# Patient Record
Sex: Female | Born: 1950
Health system: Southern US, Community
[De-identification: ages and names within clinical notes are randomized; demographics above are authoritative.]

## PROBLEM LIST (undated history)

## (undated) DIAGNOSIS — M1990 Unspecified osteoarthritis, unspecified site: Secondary | ICD-10-CM

## (undated) DIAGNOSIS — E785 Hyperlipidemia, unspecified: Secondary | ICD-10-CM

## (undated) DIAGNOSIS — S83512A Sprain of anterior cruciate ligament of left knee, initial encounter: Secondary | ICD-10-CM

## (undated) DIAGNOSIS — E079 Disorder of thyroid, unspecified: Secondary | ICD-10-CM

## (undated) DIAGNOSIS — M8580 Other specified disorders of bone density and structure, unspecified site: Secondary | ICD-10-CM

## (undated) DIAGNOSIS — R42 Dizziness and giddiness: Secondary | ICD-10-CM

## (undated) DIAGNOSIS — F32A Depression, unspecified: Secondary | ICD-10-CM

## (undated) DIAGNOSIS — E119 Type 2 diabetes mellitus without complications: Secondary | ICD-10-CM

## (undated) DIAGNOSIS — L309 Dermatitis, unspecified: Secondary | ICD-10-CM

## (undated) DIAGNOSIS — F329 Major depressive disorder, single episode, unspecified: Secondary | ICD-10-CM

## (undated) DIAGNOSIS — C4491 Basal cell carcinoma of skin, unspecified: Secondary | ICD-10-CM

## (undated) DIAGNOSIS — K559 Vascular disorder of intestine, unspecified: Secondary | ICD-10-CM

## (undated) HISTORY — PX: BREAST BIOPSY: SHX20

## (undated) HISTORY — DX: Disorder of thyroid, unspecified: E07.9

## (undated) HISTORY — DX: Hyperlipidemia, unspecified: E78.5

## (undated) HISTORY — DX: Depression, unspecified: F32.A

## (undated) HISTORY — DX: Sprain of anterior cruciate ligament of left knee, initial encounter: S83.512A

## (undated) HISTORY — DX: Major depressive disorder, single episode, unspecified: F32.9

## (undated) HISTORY — DX: Type 2 diabetes mellitus without complications: E11.9

## (undated) HISTORY — DX: Vascular disorder of intestine, unspecified: K55.9

## (undated) HISTORY — PX: TONSILLECTOMY: SUR1361

## (undated) HISTORY — DX: Dermatitis, unspecified: L30.9

## (undated) HISTORY — DX: Other specified disorders of bone density and structure, unspecified site: M85.80

## (undated) HISTORY — PX: TUBAL LIGATION: SHX77

## (undated) HISTORY — DX: Basal cell carcinoma of skin, unspecified: C44.91

---

## 1976-10-31 HISTORY — PX: THYROIDECTOMY: SHX17

## 1995-11-01 HISTORY — PX: BUNIONECTOMY: SHX129

## 1996-10-31 HISTORY — PX: BREAST MASS EXCISION: SHX1267

## 2006-08-10 HISTORY — PX: SHOULDER SURGERY: SHX246

## 2007-12-04 HISTORY — PX: KNEE ARTHROSCOPY: SUR90

## 2013-07-01 LAB — HM COLONOSCOPY

## 2013-09-05 NOTE — Patient Instructions (Signed)
Arthritis: After Your Visit  Your Care Instructions  Arthritis, also called osteoarthritis, is a breakdown of the cartilage that cushions your joints. When the cartilage wears down, your bones rub against each other. This causes pain and stiffness. Many people have some arthritis as they age. Arthritis most often affects the joints of the spine, hands, hips, knees, or feet.  You can take simple measures to protect your joints, ease your pain, and help you stay active.  Follow-up care is a key part of your treatment and safety. Be sure to make and go to all appointments, and call your doctor if you are having problems. It's also a good idea to know your test results and keep a list of the medicines you take.  How can you care for yourself at home?  ?? Stay at a healthy weight. Being overweight puts extra strain on your joints.  ?? Talk to your doctor or physical therapist about exercises that will help ease joint pain.  ?? Stretch. You may enjoy gentle forms of yoga to help keep your joints and muscles flexible.  ?? Walk instead of jog. Other types of exercise that are less stressful on the joints include riding a bicycle, swimming, or water exercise.  ?? Lift weights. Strong muscles help reduce stress on your joints. Stronger thigh muscles, for example, take some of the stress off of the knees and hips. Learn the right way to lift weights so you do not make joint pain worse.  ?? Take your medicines exactly as prescribed. Call your doctor if you think you are having a problem with your medicine.  ?? Take pain medicines exactly as directed.  ?? If the doctor gave you a prescription medicine for pain, take it as prescribed.  ?? If you are not taking a prescription pain medicine, ask your doctor if you can take an over-the-counter medicine.  ?? Use a cane, crutch, walker, or another device if you need help to get around. These can help rest your joints. You also can use other things to make life easier, such as a higher toilet  seat and padded handles on kitchen utensils.  ?? Do not sit in low chairs, which can make it hard to get up.  ?? Put heat or cold on your sore joints as needed. Use whichever helps you most. You also can take turns with hot and cold packs.  ?? Apply heat 2 or 3 times a day for 20 to 30 minutes???using a heating pad, hot shower, or hot pack???to relieve pain and stiffness.  ?? Put ice or a cold pack on your sore joint for 10 to 20 minutes at a time. Put a thin cloth between the ice and your skin.  ?? Your doctor may suggest you try the supplements glucosamine and chondroitin. They are part of what makes up normal cartilage. Tell your doctor if you have diabetes, allergies to shellfish, or if you take warfarin (Coumadin).  When should you call for help?  Call your doctor now or seek immediate medical care if:  ?? You have sudden swelling, warmth, or pain in any joint.  ?? You have joint pain and a fever or rash.  ?? You have such bad pain that you cannot use a joint.  Watch closely for changes in your health, and be sure to contact your doctor if:  ?? You have mild joint symptoms that continue even with more than 6 weeks of care at home.  ?? You have stomach   pain or other problems with your medicine.   Where can you learn more?   Go to http://www.healthwise.net/[facility]  Enter Z807 in the search box to learn more about "Arthritis: After Your Visit."   ?? 2006-2014 Healthwise, Incorporated. Care instructions adapted under license by [place] (which disclaims liability or warranty for this information). This care instruction is for use with your licensed healthcare professional. If you have questions about a medical condition or this instruction, always ask your healthcare professional. Healthwise, Incorporated disclaims any warranty or liability for your use of this information.  Content Version: 10.2.346038; Current as of: Mar 16, 2012              Osteoarthritis: After Your Visit  Your Care Instructions     Arthritis is a  common health problem in which the joints are inflamed. There are several kinds of arthritis. Osteoarthritis is caused by a breakdown of cartilage, the hard, thick tissue that cushions the joints. It causes pain, stiffness, and swelling, often in the spine, fingers, hips, and knees. Osteoarthritis can happen at any age, but it is most common in older people.  Osteoarthritis never goes away completely, but it can be controlled. Medicine and home treatment can reduce the pain and prevent the arthritis from getting worse.  Follow-up care is a key part of your treatment and safety. Be sure to make and go to all appointments, and call your doctor if you are having problems. It???s also a good idea to know your test results and keep a list of the medicines you take.  How can you care for yourself at home?  ?? Take a warm shower or bath in the morning to relieve stiffness. Avoid sitting still afterwards.  ?? If the joint is not swollen, use moist heat, like a warm, damp towel, for 20 to 30 minutes, 2 or 3 times a day. Do not use heat on a swollen joint.  ?? If the joint is swollen, use ice or cold packs for 10 to 20 minutes, once an hour. Cold will help relieve pain and reduce inflammation. Put a thin cloth between the ice and your skin.  ?? To prevent stiffness, gently move the joint through its full range of motion several times a day.  ?? If the joint hurts, avoid activities that put a strain on it for a few days. Take rest breaks throughout the day.  ?? Get regular exercise. Walking, swimming, yoga, biking, and water aerobics are good exercises that are gentle on the joints.  ?? Reach and stay at a healthy weight. If you need to lose or maintain weight, regular exercise and a healthy diet will help. Extra weight can strain the joints, especially the knees and hips, and make the pain worse. Losing even a few pounds may help.  ?? Take pain medicines exactly as directed.  ?? If the doctor gave you a prescription medicine for pain,  take it as prescribed.  ?? If you are not taking a prescription pain medicine, ask your doctor if you can take an over-the-counter medicine.  When should you call for help?  Call your doctor now or seek immediate medical care if:  ?? The pain is so bad that you cannot use the joint.  ?? You have sudden back pain with weakness in your legs or loss of bowel or bladder control.  ?? Your stools are black and tarlike or have streaks of blood.  ?? You have severe pain and swelling in more   than one joint.  Watch closely for changes in your health, and be sure to contact your doctor if:  ?? You have side effects from the medicines, like belly pain, ongoing heartburn, or nausea.  ?? Joint pain continues for more than 6 weeks, and home treatment is not helping.   Where can you learn more?   Go to http://www.healthwise.net/[facility]  Enter U459 in the search box to learn more about "Osteoarthritis: After Your Visit."   ?? 2006-2014 Healthwise, Incorporated. Care instructions adapted under license by [place] (which disclaims liability or warranty for this information). This care instruction is for use with your licensed healthcare professional. If you have questions about a medical condition or this instruction, always ask your healthcare professional. Healthwise, Incorporated disclaims any warranty or liability for your use of this information.  Content Version: 10.2.346038; Current as of: Mar 16, 2012

## 2013-09-05 NOTE — Progress Notes (Signed)
Sarah Blair     [birth year]          Rheumatology Consult Note    RE: Arthritis    Subjective:     62 y/o female p/w first episode of persistent (since the beginning of the summer) painful and swollen hand IP's. Pain worse with rest and better with activity. No association with time of day. No psoriasis. Denies prodrome, infection or injury. Takes aleve 1 tab bid with benefit.     Past Medical History   Diagnosis Date   ??? Diabetes mellitus    ??? Osteoarthritis    ??? Hypothyroidism      Past Surgical History   Procedure Laterality Date   ??? Hx rotator cuff repair Bilateral      2006 ,2007    ??? Hx thyroidectomy     ??? Hx bunionectomy       Outpatient Prescriptions Marked as Taking for the 09/05/13 encounter (Office Visit) with Charlyne Mom, MD   Medication Sig Dispense Refill   ??? metformin (GLUCOPHAGE) 500 mg tablet Take  by mouth two (2) times daily (with meals).       ??? rosuvastatin (CRESTOR) 5 mg tablet Take  by mouth nightly.       ??? buPROPion SR (WELLBUTRIN SR) 100 mg SR tablet Take  by mouth.       ??? glimepiride (AMARYL) 1 mg tablet Take  by mouth Daily (before breakfast).       ??? levothyroxine (SYNTHROID) 200 mcg tablet Take  by mouth Daily (before breakfast).         Allergies no known allergies  History     Social History   ??? Marital Status: MARRIED     Spouse Name: N/A     Number of Children: N/A   ??? 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 Smoking status: Never Smoker    ??? Smokeless tobacco: Not on file   ??? Alcohol Use: Yes      Comment: rare   ??? Drug Use: No   ??? Sexually Active: Not on file     Other Topics Concern   ??? Not on file     Social History Narrative   ??? No narrative on file     Patient is a non-smoker. Patient counseled not to smoke.  Family History   Problem Relation Age of Onset   ??? Asthma Other    ??? Other Other      niece with RA     No health maintenance topics applied.    Review of Systems  Constitutional: negative for fevers,  chills, sweats, fatigue, anorexia and weight loss/gain  Ears, Nose, Mouth, Throat, and Face: negative for hearing loss, tinnitus, ear drainage, earaches, nasal congestion, epistaxis, snoring, sore mouth, sore throat, hoarseness, voice change and facial trauma  Respiratory: negative for SOB, cough, sputum, pleurisy/chest pain, wheezing or dyspnea on exertion  Cardiovascular: negative for chest pain, chest pressure/discomfort, dyspnea, palpitations, irregular heart beats, syncope, fatigue, orthopnea, exertional chest pressure/discomfort, claudication, lower extremity edema  Gastrointestinal: negative for dysphagia, odynophagia, dyspepsia, nausea, vomiting, diarrhea, constipation and abdominal pain  Hematologic/Lymphatic: negative for easy bruising, bleeding, lymphadenopathy, petechiae, blood in stool or urine, coughing up blood and epistaxis  Neurological: negative for headaches, dizziness, vertigo, seizures, memory problems, speech problems, paresthesia, coordination problems, gait problems, tremor and weakness  Rheumatological: negative for joint pain  or swelling, alopecia, sicca, rashes, oral ulcers, poor dentition, parotid swelling, LAN, raynauds, nail complaints, swollen digits, muscle weakness or pain, SI tenderness, pleuritis, pericarditis, photosensitivity, uveitis, DVT's, miscarriages    Objective:     PHYSICAL EXAM   BP 144/72   Pulse 71   Ht 5\' 4"  (1.626 m)   Wt 140 lb (63.504 kg)   BMI 24.02 kg/m2   SpO2 98%     General: Adult Female NAD. Cooperative and coherent.   Neuro: AAO x 3, No tremors. Full reflexes. Full UE and LE strength.   HEENT: PEARL B/L, no scleral erythema, no alopecia, no oral ulcers, moist mucous membranes. Good dentition. No parotid swelling.   Heart: RRR, no murmurs. 2+ DP and radial pulses.    Lungs: CTAB   Abdomen: NT, ND   Skin: No rashes, raynauds, periungual erythema, nail findings, alopecia   Extremities Full ROM all joints. No synovitis. +B/L shoulder and knee crepitus. B/L wrist  squaring and H/B nodes. No SI tenderness. Left bunion and mild hammertoes.     Imaging  NA    Lab Review  Reportedly neg RF/Lyme/ANA/CRP    Assessment:     1. Osteoarthritis, hand, left    2. Osteoarthritis, hand, right        Plan:   Probable Hand OA  - check hand films, ccp, crp   - aleve 1 tab bid with food  Follow-up Disposition:  Return in about 2 weeks (around 09/19/2013).      Signed By: Charlyne Mom, MD     September 05, 2013   1:05 PM   [phone]

## 2013-09-20 ENCOUNTER — Encounter

## 2013-09-23 NOTE — Progress Notes (Signed)
Sarah Blair     [birth year]          Rheumatology Consult Note    RE: Arthritis    Subjective:     62 y/o female p/w first episode of persistent (since the beginning of the summer) painful and swollen hand IP's. Pain worse with rest and better with activity. No association with time of day. No psoriasis. Denies prodrome, infection or injury. Takes aleve 1 tab qd.   At last visit checked BW and xrays.   Today reviewed results with the patient. New GI upset.     Past Medical History   Diagnosis Date   ??? Diabetes mellitus    ??? Osteoarthritis    ??? Hypothyroidism      Past Surgical History   Procedure Laterality Date   ??? Hx rotator cuff repair Bilateral      2006 ,2007    ??? Hx thyroidectomy     ??? Hx bunionectomy       Outpatient Prescriptions Marked as Taking for the 09/23/13 encounter (Office Visit) with Charlyne Mom, MD   Medication Sig Dispense Refill   ??? metformin (GLUCOPHAGE) 500 mg tablet Take  by mouth two (2) times daily (with meals).       ??? rosuvastatin (CRESTOR) 5 mg tablet Take  by mouth nightly.       ??? buPROPion SR (WELLBUTRIN SR) 100 mg SR tablet Take  by mouth.       ??? glimepiride (AMARYL) 1 mg tablet Take  by mouth Daily (before breakfast).       ??? levothyroxine (SYNTHROID) 200 mcg tablet Take  by mouth Daily (before breakfast).         No Known Allergies  History     Social History   ??? Marital Status: MARRIED     Spouse Name: N/A     Number of Children: N/A   ??? 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 Smoking status: Never Smoker    ??? Smokeless tobacco: Not on file   ??? Alcohol Use: Yes      Comment: rare   ??? Drug Use: No   ??? Sexually Active: Not on file     Other Topics Concern   ??? Not on file     Social History Narrative   ??? No narrative on file     Patient is a non-smoker. Patient counseled not to smoke.  Family History   Problem Relation Age of Onset   ??? Asthma Other    ??? Other Other      niece with RA     Review of Systems   Constitutional: negative for fevers, chills, sweats, fatigue, anorexia and weight loss/gain  Ears, Nose, Mouth, Throat, and Face: negative for hearing loss, tinnitus, ear drainage, earaches, nasal congestion, epistaxis, snoring, sore mouth, sore throat, hoarseness, voice change and facial trauma  Respiratory: negative for SOB, cough, sputum, pleurisy/chest pain, wheezing or dyspnea on exertion  Cardiovascular: negative for chest pain, chest pressure/discomfort, dyspnea, palpitations, irregular heart beats, syncope, fatigue, orthopnea, exertional chest pressure/discomfort, claudication, lower extremity edema  Gastrointestinal: negative for dysphagia, odynophagia, dyspepsia, nausea, vomiting, diarrhea, constipation and abdominal pain  Hematologic/Lymphatic: negative for easy bruising, bleeding, lymphadenopathy, petechiae, blood in stool or urine, coughing up blood and epistaxis  Neurological: negative for headaches, dizziness, vertigo, seizures, memory problems, speech problems, paresthesia, coordination problems, gait problems,  tremor and weakness  Rheumatological: negative for alopecia, sicca, rashes, oral ulcers, poor dentition, parotid swelling, LAN, raynauds, nail complaints, swollen digits, muscle weakness or pain, SI tenderness, pleuritis, pericarditis, photosensitivity, uveitis, DVT's, miscarriages    Objective:     PHYSICAL EXAM   BP 134/70   Pulse 78   Ht 5' 5.5" (1.664 m)   Wt 140 lb (63.504 kg)   BMI 22.93 kg/m2   SpO2 97%     General: Adult Female NAD. Cooperative and coherent.   Neuro: AAO x 3, No tremors. Full reflexes. Full UE and LE strength.   HEENT: PEARL B/L, no scleral erythema, no alopecia, no oral ulcers, moist mucous membranes. Good dentition. No parotid swelling.   Heart: RRR, no murmurs. 2+ DP and radial pulses.    Lungs: CTAB   Abdomen: NT, ND   Skin: No rashes, raynauds, periungual erythema, nail findings, alopecia   Extremities Full ROM all joints. No synovitis. +B/L shoulder and knee  crepitus. B/L wrist squaring and H/B nodes. No SI tenderness. Left bunion and mild hammertoes.     Imaging  B/L hand films with OA and associated IP swelling.    Lab Review  neg RF/Lyme/ANA/CRP    Assessment:     1. Osteoarthritis of hand, left    2. Osteoarthritis of hand, right      Plan:   Hand OA  - prn aleve with PPI, if GI upset persists can change to another NSAID.  - check CCP and repeat CRP, patient will call with resultants  Follow-up Disposition: Not on File      Signed By: Charlyne Mom, MD     September 23, 2013   1:05 PM   [phone]

## 2013-09-23 NOTE — Patient Instructions (Signed)
Hand Arthritis: Exercises  Your Care Instructions  Here are some examples of exercises for hand arthritis. Start each exercise slowly. Ease off the exercise if you start to have pain.  Your doctor or physical therapist will tell you when you can start these exercises and which ones will work best for you.  How to do the exercises  Tendon glides    In this exercise, the steps follow one another to a make a continuous movement.  1. With your affected hand, point your fingers and thumb straight up. Your wrist should be relaxed, following the line of your fingers and thumb.  2. Curl your fingers so that the top two joints in them are bent, and your fingers wrap down. Your fingertips should touch or be near the base of your fingers. Your fingers will look like a hook.  3. Make a fist by bending your knuckles. Your thumb can gently rest against your index (pointing) finger.  4. Unwind your fingers slightly so that your fingertips can touch the base of your palm. Your thumb can rest against your index finger.  5. Move back to your starting position, with your fingers and thumb pointing up.  6. Repeat the series of motions 8 to 12 times.  7. Switch hands and repeat steps 1 through 6, even if only one hand is sore.  Intrinsic flexion    1. Rest your affected hand on a table and bend the large joints where your fingers connect to your hand. Keep your thumb and the other joints in your fingers straight.  2. Slowly straighten your fingers. Your wrist should be relaxed, following the line of your fingers and thumb.  3. Move back to your starting position, with your hand bent.  4. Repeat 8 to 12 times.  5. Switch hands and repeat steps 1 through 4, even if only one hand is sore.  Finger extension    1. Place your affected hand flat on a table.  2. Lift and then lower one finger at a time off the table.  3. Repeat 8 to 12 times.  4. Switch hands and repeat steps 1 through 3, even if only one hand is sore.  MP extension    1.  Place your good hand on a table, palm up. Put your affected hand on top of your good hand with your fingers wrapped around the thumb of your good hand like you are making a fist.  2. Slowly uncurl the joints of your affected hand where your fingers connect to your hand so that only the top two joints of your fingers are bent. Your fingers will look like a hook.  3. Move back to your starting position, with your fingers wrapped around your good thumb.  4. Repeat 8 to 12 times.  5. Switch hands and repeat steps 1 through 4, even if only one hand is sore.  PIP extension (with MP extension)    1. Place your good hand on a table, palm up. Put your affected hand on top of your good hand, palm up.  2. Use the thumb and fingers of your good hand to grasp below the middle joint of one finger of your affected hand.  3. Straighten the last two joints of that finger.  4. Repeat 8 to 12 times.  5. Repeat steps 1 through 4 with each finger.  6. Switch hands and repeat steps 1 through 5, even if only one hand is sore.  DIP flexion      1. With your good hand, grasp one finger of your affected hand. Your thumb will be on the top side of your finger just below the joint that is closest to your fingernail.  2. Slowly bend your affected finger only at the joint closest to your fingernail.  3. Repeat 8 to 12 times.  4. Repeat steps 1 through 3 with each finger.  5. Switch hands and repeat steps 1 through 4, even if only one hand is sore.  Follow-up care is a key part of your treatment and safety. Be sure to make and go to all appointments, and call your doctor if you are having problems. It's also a good idea to know your test results and keep a list of the medicines you take.   Where can you learn more?   Go to http://www.healthwise.net/[facility]  Enter E040 in the search box to learn more about "Hand Arthritis: Exercises."   ?? 2006-2014 Healthwise, Incorporated. Care instructions adapted under license by [place] (which disclaims  liability or warranty for this information). This care instruction is for use with your licensed healthcare professional. If you have questions about a medical condition or this instruction, always ask your healthcare professional. Health[employer], Inc[employer] disclaims any warranty or liability for your use of this information.  Content Version: 10.2.346038; Current as of: April 03, 2013

## 2013-09-26 LAB — CYCLIC CITRUL PEPTIDE AB, IGG: CCP Antibodies IgG/IgA: 21 units — ABNORMAL HIGH (ref 0–19)

## 2013-09-26 LAB — C REACTIVE PROTEIN, QT: C-Reactive Protein, Qt: 1.1 mg/L (ref 0.0–4.9)

## 2013-09-30 ENCOUNTER — Encounter

## 2013-11-08 NOTE — Telephone Encounter (Signed)
Patient called stating that she was seen on 09/23/2013 and never heard anything about her lab results (they are in her chart from 09/24/13). Please advise thank you

## 2013-11-11 NOTE — Telephone Encounter (Signed)
Trace positive CCP, recommended that patient come back 3 months from last visit for f/u and RA screening.

## 2014-12-19 LAB — HM DEXA SCAN

## 2015-07-29 LAB — HM DIABETES EYE EXAM

## 2015-09-08 LAB — HEPATIC FUNCTION PANEL
ALK PHOS: 61 U/L (ref 25–125)
ALT: 15 U/L (ref 7–35)
AST: 15 U/L (ref 13–35)
Bilirubin, Total: 0.5 mg/dL

## 2015-09-08 LAB — BASIC METABOLIC PANEL
BUN: 19 mg/dL (ref 4–21)
Creatinine: 0.7 mg/dL (ref <=1.1)
GLUCOSE: 152 mg/dL
Potassium: 4.3 mmol/L (ref 3.4–5.3)
Sodium: 140 mmol/L (ref 137–147)

## 2015-09-08 LAB — LIPID PANEL
CHOLESTEROL: 187 mg/dL (ref 0–200)
HDL: 80 mg/dL — AB (ref 35–70)
LDL Cholesterol: 12 mg/dL
Triglycerides: 58 mg/dL (ref 40–160)

## 2015-09-08 LAB — HEMOGLOBIN A1C: HEMOGLOBIN A1C: 6.5

## 2015-09-08 LAB — TSH: TSH: 2.97 u[IU]/mL (ref <=5.90)

## 2015-10-01 LAB — HM PAP SMEAR

## 2015-10-01 LAB — HM MAMMOGRAPHY

## 2016-01-26 ENCOUNTER — Encounter: Payer: Self-pay | Admitting: General Practice

## 2016-01-27 ENCOUNTER — Encounter: Payer: Self-pay | Admitting: Family Medicine

## 2016-01-27 ENCOUNTER — Ambulatory Visit (INDEPENDENT_AMBULATORY_CARE_PROVIDER_SITE_OTHER): Payer: Medicare Other | Admitting: Family Medicine

## 2016-01-27 VITALS — BP 110/78 | HR 76 | Temp 98.0°F | Resp 16 | Ht 64.0 in | Wt 133.5 lb

## 2016-01-27 DIAGNOSIS — E038 Other specified hypothyroidism: Secondary | ICD-10-CM

## 2016-01-27 DIAGNOSIS — F329 Major depressive disorder, single episode, unspecified: Secondary | ICD-10-CM | POA: Diagnosis not present

## 2016-01-27 DIAGNOSIS — M4120 Other idiopathic scoliosis, site unspecified: Secondary | ICD-10-CM

## 2016-01-27 DIAGNOSIS — E119 Type 2 diabetes mellitus without complications: Secondary | ICD-10-CM | POA: Insufficient documentation

## 2016-01-27 DIAGNOSIS — F32A Depression, unspecified: Secondary | ICD-10-CM | POA: Insufficient documentation

## 2016-01-27 DIAGNOSIS — E039 Hypothyroidism, unspecified: Secondary | ICD-10-CM | POA: Insufficient documentation

## 2016-01-27 LAB — BASIC METABOLIC PANEL
BUN: 18 mg/dL (ref 6–23)
CHLORIDE: 101 meq/L (ref 96–112)
CO2: 30 meq/L (ref 19–32)
CREATININE: 0.77 mg/dL (ref 0.40–1.20)
Calcium: 9.8 mg/dL (ref 8.4–10.5)
GFR: 79.95 mL/min (ref >60.00)
Glucose, Bld: 111 mg/dL — ABNORMAL HIGH (ref 70–99)
POTASSIUM: 4.2 meq/L (ref 3.5–5.1)
SODIUM: 137 meq/L (ref 135–145)

## 2016-01-27 LAB — HEMOGLOBIN A1C: Hgb A1c MFr Bld: 6.4 % (ref 4.6–6.5)

## 2016-01-27 LAB — MICROALBUMIN / CREATININE URINE RATIO
CREATININE, U: 24.2 mg/dL
MICROALB/CREAT RATIO: 2.9 mg/g (ref 0.0–30.0)

## 2016-01-27 NOTE — Progress Notes (Signed)
   Subjective:    Patient ID: Ashley Shaffer, female    DOB: July 18, 1951, 65 y.o.   MRN: [id number]  HPI New to establish.  Pt recently moved from [place].  DM- chronic problem.  On Metformin and Onglyza.  Pt managed sugars for 10+ yrs w/ diet and exercise.  UTD on eye exam (August 2016)- no retinopathy.  Denies symptomatic lows.  CBGs 127 this AM- taking 500mg  BID.  No numbness/tingling of feet.  Denies CP, SOB, HAs, visual changes, edema  Hypothyroid- s/p thyroidectomy.  Currently on Levothyroxine 171mcg.  Denies excessive fatigue.  No changes to skin/hair/nails.    Anxiety/depression- chronic problem, on Wellbutrin.  Denies current sxs.  Chronic back pain- currently in Acupuncture w/ some relief.  Continues to have pain- worst in the R psoas.  Pt has hx of scoliosis.  Pt loves to ride horses but is unable to ride due to pain.   Review of Systems For ROS see HPI     Objective:   Physical Exam  Constitutional: She is oriented to person, place, and time. She appears well-developed and well-nourished. No distress.  HENT:  Head: Normocephalic and atraumatic.  Eyes: Conjunctivae and EOM are normal. Pupils are equal, round, and reactive to light.  Neck: Normal range of motion. Neck supple. No thyromegaly present.  Cardiovascular: Normal rate, regular rhythm, normal heart sounds and intact distal pulses.   No murmur heard. Pulmonary/Chest: Effort normal and breath sounds normal. No respiratory distress.  Abdominal: Soft. She exhibits no distension. There is no tenderness.  Musculoskeletal: She exhibits no edema.  scoliosis  Lymphadenopathy:    She has no cervical adenopathy.  Neurological: She is alert and oriented to person, place, and time.  Skin: Skin is warm and dry.  Psychiatric: She has a normal mood and affect. Her behavior is normal.  Vitals reviewed.         Assessment & Plan:

## 2016-01-27 NOTE — Patient Instructions (Signed)
Follow up as scheduled We'll notify you of your lab results and make any changes if needed We'll call you with your orthopedic appt Keep up the good work on healthy diet and regular exercise- you look great! Call with any questions or concerns Welcome!!!  We're glad to have you!!!

## 2016-01-27 NOTE — Progress Notes (Signed)
Pre visit review using our clinic review tool, if applicable. No additional management support is needed unless otherwise documented below in the visit note. 

## 2016-01-28 MED ORDER — GLUCOSE BLOOD VI STRP: ORAL_STRIP | Status: DC

## 2016-01-28 NOTE — Assessment & Plan Note (Signed)
New to provider, ongoing issue for pt since thyroidectomy.  Check labs.  Adjust meds prn.  Pt expressed understanding and is in agreement w/ plan.

## 2016-01-28 NOTE — Assessment & Plan Note (Signed)
New to provider, ongoing for pt.  UTD on eye exam.  Foot exam done today.  Get microalbumin.  Check labs.  Adjust meds prn.

## 2016-01-28 NOTE — Assessment & Plan Note (Signed)
New to provider, ongoing for pt.  sxs are well controlled on Wellbutrin.  No changes needed at this time.

## 2016-01-28 NOTE — Telephone Encounter (Signed)
Medication filled to pharmacy as requested.   

## 2016-01-28 NOTE — Assessment & Plan Note (Signed)
New to provider, ongoing for pt.  She has a pretty significant curve and this is causing her chronic back pain.  Currently getting acupuncture treatment.  Asking for referral for evaluation and ongoing pain management.  Referral to ortho provided.

## 2016-02-03 DIAGNOSIS — M25551 Pain in right hip: Secondary | ICD-10-CM | POA: Diagnosis not present

## 2016-02-03 DIAGNOSIS — M47816 Spondylosis without myelopathy or radiculopathy, lumbar region: Secondary | ICD-10-CM | POA: Diagnosis not present

## 2016-02-03 DIAGNOSIS — M4120 Other idiopathic scoliosis, site unspecified: Secondary | ICD-10-CM | POA: Diagnosis not present

## 2016-02-10 DIAGNOSIS — M25551 Pain in right hip: Secondary | ICD-10-CM | POA: Diagnosis not present

## 2016-02-16 ENCOUNTER — Encounter: Payer: Self-pay | Admitting: General Practice

## 2016-04-01 ENCOUNTER — Encounter: Payer: Self-pay | Admitting: Family Medicine

## 2016-04-01 ENCOUNTER — Ambulatory Visit (INDEPENDENT_AMBULATORY_CARE_PROVIDER_SITE_OTHER): Payer: Medicare Other | Admitting: Family Medicine

## 2016-04-01 VITALS — BP 110/80 | HR 72 | Temp 98.0°F | Resp 16 | Ht 64.0 in | Wt 130.2 lb

## 2016-04-01 DIAGNOSIS — Z0000 Encounter for general adult medical examination without abnormal findings: Secondary | ICD-10-CM | POA: Diagnosis not present

## 2016-04-01 DIAGNOSIS — Z23 Encounter for immunization: Secondary | ICD-10-CM

## 2016-04-01 DIAGNOSIS — E119 Type 2 diabetes mellitus without complications: Secondary | ICD-10-CM

## 2016-04-01 DIAGNOSIS — E038 Other specified hypothyroidism: Secondary | ICD-10-CM | POA: Diagnosis not present

## 2016-04-01 LAB — CBC WITH DIFFERENTIAL/PLATELET
BASOS PCT: 0.4 % (ref 0.0–3.0)
Basophils Absolute: 0 10*3/uL (ref 0.0–0.1)
EOS ABS: 0.8 10*3/uL — AB (ref 0.0–0.7)
Eosinophils Relative: 10 % — ABNORMAL HIGH (ref 0.0–5.0)
HCT: 41.8 % (ref 36.0–46.0)
Hemoglobin: 13.9 g/dL (ref 12.0–15.0)
LYMPHS ABS: 1.6 10*3/uL (ref 0.7–4.0)
Lymphocytes Relative: 19.1 % (ref 12.0–46.0)
MCHC: 33.4 g/dL (ref 30.0–36.0)
MCV: 88.3 fl (ref 78.0–100.0)
MONO ABS: 0.7 10*3/uL (ref 0.1–1.0)
Monocytes Relative: 8.1 % (ref 3.0–12.0)
NEUTROS ABS: 5.2 10*3/uL (ref 1.4–7.7)
Neutrophils Relative %: 62.4 % (ref 43.0–77.0)
PLATELETS: 276 10*3/uL (ref 150.0–400.0)
RBC: 4.74 Mil/uL (ref 3.87–5.11)
RDW: 12.5 % (ref 11.5–15.5)
WBC: 8.3 10*3/uL (ref 4.0–10.5)

## 2016-04-01 LAB — LIPID PANEL
Cholesterol: 217 mg/dL — ABNORMAL HIGH (ref 0–200)
HDL: 83.6 mg/dL (ref >39.00)
LDL Cholesterol: 103 mg/dL — ABNORMAL HIGH (ref 0–99)
NONHDL: 133.82
Total CHOL/HDL Ratio: 3
Triglycerides: 152 mg/dL — ABNORMAL HIGH (ref 0.0–149.0)
VLDL: 30.4 mg/dL (ref 0.0–40.0)

## 2016-04-01 LAB — HEPATIC FUNCTION PANEL
ALK PHOS: 71 U/L (ref 39–117)
ALT: 17 U/L (ref 0–35)
AST: 17 U/L (ref 0–37)
Albumin: 4.5 g/dL (ref 3.5–5.2)
BILIRUBIN DIRECT: 0.1 mg/dL (ref 0.0–0.3)
BILIRUBIN TOTAL: 0.5 mg/dL (ref 0.2–1.2)
Total Protein: 6.6 g/dL (ref 6.0–8.3)

## 2016-04-01 LAB — BASIC METABOLIC PANEL
BUN: 17 mg/dL (ref 6–23)
CHLORIDE: 101 meq/L (ref 96–112)
CO2: 26 meq/L (ref 19–32)
CREATININE: 0.77 mg/dL (ref 0.40–1.20)
Calcium: 9.6 mg/dL (ref 8.4–10.5)
GFR: 79.9 mL/min (ref >60.00)
Glucose, Bld: 179 mg/dL — ABNORMAL HIGH (ref 70–99)
Potassium: 3.9 mEq/L (ref 3.5–5.1)
SODIUM: 140 meq/L (ref 135–145)

## 2016-04-01 LAB — TSH: TSH: 0.61 u[IU]/mL (ref 0.35–4.50)

## 2016-04-01 MED ORDER — BUPROPION HCL 100 MG PO TABS: 100.0000 mg | ORAL_TABLET | Freq: Two times a day (BID) | ORAL | Status: DC

## 2016-04-01 NOTE — Addendum Note (Signed)
Addended by: Davis Gourd on: 04/01/2016 10:59 AM   Modules accepted: Orders

## 2016-04-01 NOTE — Progress Notes (Signed)
Pre visit review using our clinic review tool, if applicable. No additional management support is needed unless otherwise documented below in the visit note. 

## 2016-04-01 NOTE — Patient Instructions (Signed)
Follow up in 3 months to recheck diabetes We'll notify you of your lab results and make any changes if needed Keep up the good work on healthy diet and regular exercise- you look fantastic!!! You received your Prevnar today You are up to date on colonoscopy until 2019- yay!! Mammo is due in December- we'll order that at your next visit You are up to date on bone density Call with any questions or concerns Have a great summer!!!

## 2016-04-01 NOTE — Progress Notes (Signed)
   Subjective:    Patient ID: Ashley Shaffer, female    DOB: 1950/12/02, 65 y.o.   MRN: [id number]  HPI Here today for CPE.  Risk Factors: DM- chronic problem, last A1C 6.4 on Metformin and Onglyza.  UTD on eye exam, foot exam, microalbumin.   Hypothyroid- chronic problem, on Levothyroxine 131mcg daily. Physical Activity: exercising regularly, has lost 4 lbs since last visit Fall Risk: low Depression: denies current sxs, controlled on Wellbutrin Hearing: normal to conversational tones and whispered voice at 6 ft ADL's: independent Cognitive: normal linear thought process, memory and attention intact Home Safety: safe at home Height, Weight, BMI, Visual Acuity: see vitals, vision corrected to 20/20 w/ glasses Counseling: UTD on colonoscopy (due 2019), mammo, pap.  UTD on eye exam.  Due for Liberal Will: pt believes that they do have POA and living will in place, will check w/ husband (who is her designated POA) Labs Ordered: See A&P Care Plan: See A&P    Review of Systems Patient reports no vision/ hearing changes, adenopathy,fever, weight change,  persistant/recurrent hoarseness , swallowing issues, chest pain, palpitations, edema, persistant/recurrent cough, hemoptysis, dyspnea (rest/exertional/paroxysmal nocturnal), gastrointestinal bleeding (melena, rectal bleeding), abdominal pain, significant heartburn, bowel changes, GU symptoms (dysuria, hematuria, incontinence), Gyn symptoms (abnormal  bleeding, pain),  syncope, focal weakness, memory loss, numbness & tingling, skin/hair/nail changes, abnormal bruising or bleeding, anxiety, or depression.     Objective:   Physical Exam General Appearance:    Alert, cooperative, no distress, appears stated age  Head:    Normocephalic, without obvious abnormality, atraumatic  Eyes:    PERRL, conjunctiva/corneas clear, EOM's intact, fundi    benign, both eyes  Ears:    Normal TM's and external ear canals, both ears  Nose:    Nares normal, septum midline, mucosa normal, no drainage    or sinus tenderness  Throat:   Lips, mucosa, and tongue normal; teeth and gums normal  Neck:   Supple, symmetrical, trachea midline, no adenopathy;    Thyroid: no enlargement/tenderness/nodules  Back:     Symmetric, no curvature, ROM normal, no CVA tenderness  Lungs:     Clear to auscultation bilaterally, respirations unlabored  Chest Wall:    No tenderness or deformity   Heart:    Regular rate and rhythm, S1 and S2 normal, no murmur, rub   or gallop  Breast Exam:    Deferred to mammo  Abdomen:     Soft, non-tender, bowel sounds active all four quadrants,    no masses, no organomegaly  Genitalia:    Deferred to GYN  Rectal:    Extremities:   Extremities normal, atraumatic, no cyanosis or edema  Pulses:   2+ and symmetric all extremities  Skin:   Skin color, texture, turgor normal, no rashes or lesions  Lymph nodes:   Cervical, supraclavicular, and axillary nodes normal  Neurologic:   CNII-XII intact, normal strength, sensation and reflexes    throughout          Assessment & Plan:

## 2016-04-04 ENCOUNTER — Ambulatory Visit: Payer: Medicare Other | Admitting: Family Medicine

## 2016-04-04 ENCOUNTER — Telehealth: Payer: Self-pay | Admitting: Family Medicine

## 2016-04-04 NOTE — Telephone Encounter (Signed)
Pt states that when her wellbutrin was called into [facility] that it was not for the slow release that she had been on and asking if it could be called in for her, pt states that it works better.

## 2016-04-04 NOTE — Assessment & Plan Note (Signed)
Pt's PE WNL.  UTD on colonoscopy, mammo, pap.  Due for prevnar- given today.  Written screening schedule updated and given to pt.  EKG done as baseline.  Check labs.  Anticipatory guidance provided.

## 2016-04-04 NOTE — Assessment & Plan Note (Signed)
Recent A1C looks good.  UTD on eye exam, foot exam, microalbumin.  Again discussed need for healthy diet and regular exercise.  Will continue to follow closely.

## 2016-04-04 NOTE — Telephone Encounter (Signed)
Please clarify the dose w/ her as the long acting doesn't come in 100mg , only 150mg  once daily.  If she prefers the Wellbutrin XL 150mg  once daily, we can certainly send that in.  We just renewed what we were told she was taking.

## 2016-04-04 NOTE — Assessment & Plan Note (Signed)
Chronic problem.  Currently asymptomatic.  Check labs.  Adjust meds prn  

## 2016-04-05 ENCOUNTER — Other Ambulatory Visit: Payer: Self-pay | Admitting: General Practice

## 2016-04-05 MED ORDER — BUPROPION HCL ER (SR) 100 MG PO TB12: 100.0000 mg | ORAL_TABLET | Freq: Two times a day (BID) | ORAL | Status: DC

## 2016-04-05 NOTE — Telephone Encounter (Signed)
Spoke with pt her bottle said wellbutrin 100mg  SR twice daily. i sent this in to the pharmacy today for her and updated the chart.

## 2016-04-05 NOTE — Telephone Encounter (Signed)
Called patient and LMOVM to return call.     

## 2016-04-18 ENCOUNTER — Other Ambulatory Visit: Payer: Self-pay | Admitting: General Practice

## 2016-04-18 MED ORDER — SAXAGLIPTIN HCL 5 MG PO TABS: 5.0000 mg | ORAL_TABLET | Freq: Every day | ORAL | Status: DC

## 2016-04-28 DIAGNOSIS — M1711 Unilateral primary osteoarthritis, right knee: Secondary | ICD-10-CM | POA: Diagnosis not present

## 2016-05-10 DIAGNOSIS — M1711 Unilateral primary osteoarthritis, right knee: Secondary | ICD-10-CM | POA: Diagnosis not present

## 2016-05-17 DIAGNOSIS — M1711 Unilateral primary osteoarthritis, right knee: Secondary | ICD-10-CM | POA: Diagnosis not present

## 2016-06-29 ENCOUNTER — Ambulatory Visit (INDEPENDENT_AMBULATORY_CARE_PROVIDER_SITE_OTHER): Payer: Medicare Other | Admitting: Family Medicine

## 2016-06-29 ENCOUNTER — Encounter: Payer: Self-pay | Admitting: Family Medicine

## 2016-06-29 VITALS — BP 110/78 | HR 76 | Temp 98.1°F | Resp 16 | Ht 64.0 in | Wt 127.2 lb

## 2016-06-29 DIAGNOSIS — H31002 Unspecified chorioretinal scars, left eye: Secondary | ICD-10-CM | POA: Diagnosis not present

## 2016-06-29 DIAGNOSIS — Z1231 Encounter for screening mammogram for malignant neoplasm of breast: Secondary | ICD-10-CM

## 2016-06-29 DIAGNOSIS — E119 Type 2 diabetes mellitus without complications: Secondary | ICD-10-CM

## 2016-06-29 LAB — BASIC METABOLIC PANEL
BUN: 17 mg/dL (ref 6–23)
CALCIUM: 9 mg/dL (ref 8.4–10.5)
CHLORIDE: 100 meq/L (ref 96–112)
CO2: 30 meq/L (ref 19–32)
CREATININE: 0.68 mg/dL (ref 0.40–1.20)
GFR: 92.16 mL/min (ref >60.00)
Glucose, Bld: 140 mg/dL — ABNORMAL HIGH (ref 70–99)
Potassium: 4.5 mEq/L (ref 3.5–5.1)
Sodium: 137 mEq/L (ref 135–145)

## 2016-06-29 LAB — HEMOGLOBIN A1C: HEMOGLOBIN A1C: 6.1 % (ref 4.6–6.5)

## 2016-06-29 NOTE — Progress Notes (Signed)
Pre visit review using our clinic review tool, if applicable. No additional management support is needed unless otherwise documented below in the visit note. 

## 2016-06-29 NOTE — Patient Instructions (Signed)
Follow up in 3-4 months to recheck diabetes and cholesterol We'll notify you of your lab results and make any changes if needed We'll call you with your retinal appt and mammogram appts Call with any questions or concerns Happy Labor Day!!!

## 2016-06-29 NOTE — Progress Notes (Signed)
   Subjective:    Patient ID: Ashley Shaffer, female    DOB: 29-May-1951, 65 y.o.   MRN: [id number]  HPI DM- chronic problem, on Metformin and Onglyza.  UTD on eye exam (due in Sept), foot exam, microalbumin.  Pt is requesting a referral to Retinal Specialist due to scar on L retina.  Exercising regularly.  No symptomatic lows.  No numbness/tingling of hands/feet.  No CP, SOB, HAs, visual changes, abd pain, N/V, edema.   Review of Systems For ROS see HPI     Objective:   Physical Exam  Constitutional: She is oriented to person, place, and time. She appears well-developed and well-nourished. No distress.  HENT:  Head: Normocephalic and atraumatic.  Eyes: Conjunctivae and EOM are normal. Pupils are equal, round, and reactive to light.  Neck: Normal range of motion. Neck supple. No thyromegaly present.  Cardiovascular: Normal rate, regular rhythm, normal heart sounds and intact distal pulses.   No murmur heard. Pulmonary/Chest: Effort normal and breath sounds normal. No respiratory distress.  Abdominal: Soft. She exhibits no distension. There is no tenderness.  Musculoskeletal: She exhibits no edema.  Lymphadenopathy:    She has no cervical adenopathy.  Neurological: She is alert and oriented to person, place, and time.  Skin: Skin is warm and dry.  Psychiatric: She has a normal mood and affect. Her behavior is normal.  Vitals reviewed.         Assessment & Plan:

## 2016-06-29 NOTE — Assessment & Plan Note (Signed)
Chronic problem.  Doing very well.  Asymptomatic.  UTD on eye exam, foot exam, microalbumin.  Check labs.  Adjust meds prn.

## 2016-07-05 ENCOUNTER — Telehealth: Payer: Self-pay | Admitting: Emergency Medicine

## 2016-07-05 DIAGNOSIS — H31002 Unspecified chorioretinal scars, left eye: Secondary | ICD-10-CM

## 2016-07-05 NOTE — Telephone Encounter (Signed)
Patient called requesting a referral to Opthalmology. She was referred to a Retina Specialist which has cancelled the appointment. She was advised she would need to see Opthalmology first then if need to then they will refer her to specialist.

## 2016-07-06 NOTE — Addendum Note (Signed)
Addended by: Davis Gourd on: 07/06/2016 08:19 AM   Modules accepted: Orders

## 2016-07-06 NOTE — Telephone Encounter (Signed)
New referral placed.

## 2016-07-18 DIAGNOSIS — H31092 Other chorioretinal scars, left eye: Secondary | ICD-10-CM | POA: Diagnosis not present

## 2016-07-18 DIAGNOSIS — Z7984 Long term (current) use of oral hypoglycemic drugs: Secondary | ICD-10-CM | POA: Diagnosis not present

## 2016-07-18 DIAGNOSIS — E119 Type 2 diabetes mellitus without complications: Secondary | ICD-10-CM | POA: Diagnosis not present

## 2016-07-18 DIAGNOSIS — H2513 Age-related nuclear cataract, bilateral: Secondary | ICD-10-CM | POA: Diagnosis not present

## 2016-07-18 LAB — HM DIABETES EYE EXAM

## 2016-07-19 ENCOUNTER — Encounter: Payer: Self-pay | Admitting: General Practice

## 2016-09-06 ENCOUNTER — Ambulatory Visit (INDEPENDENT_AMBULATORY_CARE_PROVIDER_SITE_OTHER): Payer: Medicare Other | Admitting: Family Medicine

## 2016-09-06 ENCOUNTER — Encounter: Payer: Self-pay | Admitting: Family Medicine

## 2016-09-06 VITALS — BP 112/76 | HR 76 | Temp 98.5°F | Resp 16 | Ht 64.0 in | Wt 131.1 lb

## 2016-09-06 DIAGNOSIS — J209 Acute bronchitis, unspecified: Secondary | ICD-10-CM | POA: Diagnosis not present

## 2016-09-06 DIAGNOSIS — M67471 Ganglion, right ankle and foot: Secondary | ICD-10-CM

## 2016-09-06 DIAGNOSIS — M2550 Pain in unspecified joint: Secondary | ICD-10-CM

## 2016-09-06 MED ORDER — CELECOXIB 100 MG PO CAPS: 100.0000 mg | ORAL_CAPSULE | Freq: Two times a day (BID) | ORAL | 3 refills | Status: DC

## 2016-09-06 MED ORDER — AZITHROMYCIN 250 MG PO TABS: ORAL_TABLET | ORAL | 0 refills | Status: DC

## 2016-09-06 MED ORDER — GUAIFENESIN-CODEINE 100-10 MG/5ML PO SYRP: 10.0000 mL | ORAL_SOLUTION | Freq: Three times a day (TID) | ORAL | 0 refills | Status: DC | PRN

## 2016-09-06 NOTE — Patient Instructions (Signed)
Follow up as scheduled/needed Start the Zpack as directed Use the Codeine based cough syrup as needed- will cause drowsiness Mucinex DM for daytime cough Start Celebrex twice daily (with food) for joint pain We'll call you with your Rheumatology and Ortho appts Call with any questions or concerns Hang in there!!!

## 2016-09-06 NOTE — Progress Notes (Signed)
Pre visit review using our clinic review tool, if applicable. No additional management support is needed unless otherwise documented below in the visit note. 

## 2016-09-06 NOTE — Progress Notes (Signed)
   Subjective:    Patient ID: Ashley Shaffer, female    DOB: August 18, 1951, 65 y.o.   MRN: [id number]  HPI Joint pain- pt is having joint deformity, ulnar deviation, swelling of hands/feet.  Saw Rheum once previously who thought that RA was a possibility.  Not able to control pain due to GI issues w/ NSAIDs.    Cough- sxs started Saturday.  No fevers.  + PND.  No HA.  Cough is continuous, wet but not productive.  No known sick contacts.     Review of Systems     Objective:   Physical Exam  Constitutional: She is oriented to person, place, and time. She appears well-developed and well-nourished. No distress.  HENT:  Head: Normocephalic and atraumatic.  TMs normal bilaterally Mild nasal congestion Throat w/out erythema, edema, or exudate  Eyes: Conjunctivae and EOM are normal. Pupils are equal, round, and reactive to light.  Neck: Normal range of motion. Neck supple.  Cardiovascular: Normal rate, regular rhythm, normal heart sounds and intact distal pulses.   No murmur heard. Pulmonary/Chest: Effort normal and breath sounds normal. No respiratory distress. She has no wheezes.  + hacking cough  Musculoskeletal: She exhibits deformity (freely mobile soft tissue mass on dorsum of R foot consistent w/ ganglion, PIP and DIP joints of hands/feet w/ nodularity).  Lymphadenopathy:    She has no cervical adenopathy.  Neurological: She is alert and oriented to person, place, and time.  Skin: Skin is warm and dry.          Assessment & Plan:  Bronchitis- new.  Pt's sxs and PE consistent w/ bronchitis and pt has upcoming flight.  Start prescription cough meds for near continuous cough.  Zpack as directed.  Reviewed supportive care and red flags that should prompt return.  Pt expressed understanding and is in agreement w/ plan.   Ganglion cyst- new, dorsum of R foot.  Refer to ortho for evaluation and tx.  Pt expressed understanding and is in agreement w/ plan.   Polyarthralgia- new to  provider, ongoing for pt but worsening recently.  Pt has very nodular DIP/PIP joints and some ulnar deviation of hands/feet.  Saw Rheum once in [place] but then the doctor left and was not replaced by another provider.  Not able to take OTC NSAIDs due to GI complications.  Will start Celebrex and refer back to Rheum for complete evaluation and tx.  Pt expressed understanding and is in agreement w/ plan.

## 2016-09-07 ENCOUNTER — Telehealth: Payer: Self-pay | Admitting: Family Medicine

## 2016-09-07 NOTE — Telephone Encounter (Signed)
Pt states that she woke up this morning having diarrhea,stomach cramps,nausea,headaches, and feeling light headed. Pt states that she took meds yesterday and was wondering if this was the cause of how she is feeling. Pt was able to eat eggs this morning and feeling a little better, but asking if she should take the meds. Please advise.

## 2016-09-07 NOTE — Telephone Encounter (Signed)
Patient notified of PCP recommendations and is agreement and expresses an understanding.  

## 2016-09-07 NOTE — Telephone Encounter (Signed)
It's not likely that the medication caused this- probably just bad timing of a viral illness that's going around.  I would continue the medication as directed and take Immodium as needed for the diarrhea

## 2016-09-15 DIAGNOSIS — M2550 Pain in unspecified joint: Secondary | ICD-10-CM | POA: Diagnosis not present

## 2016-09-27 ENCOUNTER — Other Ambulatory Visit: Payer: Self-pay | Admitting: Family Medicine

## 2016-09-27 DIAGNOSIS — Z1231 Encounter for screening mammogram for malignant neoplasm of breast: Secondary | ICD-10-CM

## 2016-10-08 DIAGNOSIS — S61551A Open bite of right wrist, initial encounter: Secondary | ICD-10-CM | POA: Diagnosis not present

## 2016-10-08 DIAGNOSIS — W540XXA Bitten by dog, initial encounter: Secondary | ICD-10-CM | POA: Diagnosis not present

## 2016-10-10 ENCOUNTER — Other Ambulatory Visit: Payer: Self-pay | Admitting: Family Medicine

## 2016-10-11 ENCOUNTER — Other Ambulatory Visit: Payer: Self-pay | Admitting: General Practice

## 2016-10-11 ENCOUNTER — Encounter: Payer: Self-pay | Admitting: Family Medicine

## 2016-10-11 ENCOUNTER — Ambulatory Visit (INDEPENDENT_AMBULATORY_CARE_PROVIDER_SITE_OTHER): Payer: Medicare Other | Admitting: Family Medicine

## 2016-10-11 VITALS — BP 113/78 | HR 66 | Temp 98.0°F | Resp 16 | Ht 64.0 in | Wt 129.2 lb

## 2016-10-11 DIAGNOSIS — Z23 Encounter for immunization: Secondary | ICD-10-CM

## 2016-10-11 DIAGNOSIS — E119 Type 2 diabetes mellitus without complications: Secondary | ICD-10-CM | POA: Diagnosis not present

## 2016-10-11 LAB — BASIC METABOLIC PANEL
BUN: 18 mg/dL (ref 6–23)
CHLORIDE: 101 meq/L (ref 96–112)
CO2: 27 meq/L (ref 19–32)
CREATININE: 0.61 mg/dL (ref 0.40–1.20)
Calcium: 9.5 mg/dL (ref 8.4–10.5)
GFR: 104.37 mL/min (ref >60.00)
GLUCOSE: 173 mg/dL — AB (ref 70–99)
Potassium: 3.8 mEq/L (ref 3.5–5.1)
Sodium: 138 mEq/L (ref 135–145)

## 2016-10-11 LAB — HEPATIC FUNCTION PANEL
ALBUMIN: 4.5 g/dL (ref 3.5–5.2)
ALT: 20 U/L (ref 0–35)
AST: 19 U/L (ref 0–37)
Alkaline Phosphatase: 63 U/L (ref 39–117)
BILIRUBIN DIRECT: 0.1 mg/dL (ref 0.0–0.3)
TOTAL PROTEIN: 6.8 g/dL (ref 6.0–8.3)
Total Bilirubin: 0.6 mg/dL (ref 0.2–1.2)

## 2016-10-11 LAB — HEMOGLOBIN A1C: HEMOGLOBIN A1C: 6.3 % (ref 4.6–6.5)

## 2016-10-11 LAB — LIPID PANEL
CHOLESTEROL: 235 mg/dL — AB (ref 0–200)
HDL: 88.7 mg/dL (ref >39.00)
LDL Cholesterol: 132 mg/dL — ABNORMAL HIGH (ref 0–99)
NONHDL: 145.85
TRIGLYCERIDES: 70 mg/dL (ref 0.0–149.0)
Total CHOL/HDL Ratio: 3
VLDL: 14 mg/dL (ref 0.0–40.0)

## 2016-10-11 MED ORDER — SAXAGLIPTIN HCL 5 MG PO TABS: 5.0000 mg | ORAL_TABLET | Freq: Every day | ORAL | 0 refills | Status: DC

## 2016-10-11 NOTE — Assessment & Plan Note (Signed)
Chronic problem, hx of excellent control.  UTD on foot exam, eye exam, microalbumin.  Currently asymptomatic.  Check labs.  Adjust meds prn.

## 2016-10-11 NOTE — Progress Notes (Signed)
Pre visit review using our clinic review tool, if applicable. No additional management support is needed unless otherwise documented below in the visit note. 

## 2016-10-11 NOTE — Progress Notes (Signed)
   Subjective:    Patient ID: Ashley Shaffer, female    DOB: 21-Nov-1950, 65 y.o.   MRN: [id number]  HPI DM- chronic problem, on Metformin and Onglyza daily w/ hx of excellent control.  UTD on foot exam, eye exam, microalbumin.  Excellent BP control.  Continues to exercise regularly.  No CP, SOB, HAs, visual changes, abd pain, N/V, numbness/tingling hands/feet, symptomatic lows.   Review of Systems For ROS see HPI     Objective:   Physical Exam  Constitutional: She is oriented to person, place, and time. She appears well-developed and well-nourished. No distress.  HENT:  Head: Normocephalic and atraumatic.  Eyes: Conjunctivae and EOM are normal. Pupils are equal, round, and reactive to light.  Neck: Normal range of motion. Neck supple. No thyromegaly present.  Cardiovascular: Normal rate, regular rhythm, normal heart sounds and intact distal pulses.   No murmur heard. Pulmonary/Chest: Effort normal and breath sounds normal. No respiratory distress.  Abdominal: Soft. She exhibits no distension. There is no tenderness.  Musculoskeletal: She exhibits no edema.  Lymphadenopathy:    She has no cervical adenopathy.  Neurological: She is alert and oriented to person, place, and time.  Skin: Skin is warm and dry.  Psychiatric: She has a normal mood and affect. Her behavior is normal.  Vitals reviewed.         Assessment & Plan:

## 2016-10-11 NOTE — Patient Instructions (Signed)
Schedule your complete physical for June We'll notify you of your lab results and make any changes if needed Keep up the good work on healthy diet and regular exercise- you look great!!! Call with any questions or concerns Safe travels!!! Happy Holidays!!!

## 2016-10-12 NOTE — Progress Notes (Signed)
Called pt and lmovm to return call.

## 2016-10-13 ENCOUNTER — Other Ambulatory Visit: Payer: Self-pay | Admitting: General Practice

## 2016-10-13 DIAGNOSIS — E785 Hyperlipidemia, unspecified: Secondary | ICD-10-CM

## 2016-10-13 MED ORDER — ATORVASTATIN CALCIUM 10 MG PO TABS: 10.0000 mg | ORAL_TABLET | Freq: Every day | ORAL | 6 refills | Status: DC

## 2016-11-02 ENCOUNTER — Encounter: Payer: Self-pay | Admitting: Radiology

## 2016-11-02 ENCOUNTER — Ambulatory Visit
Admission: RE | Admit: 2016-11-02 | Discharge: 2016-11-02 | Disposition: A | Discharge status: Home / Self Care | Payer: Medicare Other | Source: Ambulatory Visit | Attending: Family Medicine | Admitting: Family Medicine

## 2016-11-02 DIAGNOSIS — Z1231 Encounter for screening mammogram for malignant neoplasm of breast: Secondary | ICD-10-CM

## 2016-11-18 ENCOUNTER — Telehealth: Payer: Self-pay | Admitting: Family Medicine

## 2016-11-18 DIAGNOSIS — Z1283 Encounter for screening for malignant neoplasm of skin: Secondary | ICD-10-CM

## 2016-11-18 NOTE — Telephone Encounter (Signed)
Referral placed. Pt called and LMOVM to inform.

## 2016-11-18 NOTE — Telephone Encounter (Signed)
Ok for referral?

## 2016-11-18 NOTE — Telephone Encounter (Signed)
Patient requesting referral to dermatology for routine skin cancer screening.

## 2016-11-18 NOTE — Telephone Encounter (Signed)
Ok to refer.

## 2016-11-23 ENCOUNTER — Other Ambulatory Visit (INDEPENDENT_AMBULATORY_CARE_PROVIDER_SITE_OTHER): Payer: Medicare Other

## 2016-11-23 DIAGNOSIS — E785 Hyperlipidemia, unspecified: Secondary | ICD-10-CM | POA: Diagnosis not present

## 2016-11-23 LAB — HEPATIC FUNCTION PANEL
ALBUMIN: 4.3 g/dL (ref 3.5–5.2)
ALT: 16 U/L (ref 0–35)
AST: 16 U/L (ref 0–37)
Alkaline Phosphatase: 73 U/L (ref 39–117)
Bilirubin, Direct: 0.1 mg/dL (ref 0.0–0.3)
Total Bilirubin: 0.5 mg/dL (ref 0.2–1.2)
Total Protein: 6.6 g/dL (ref 6.0–8.3)

## 2016-11-28 ENCOUNTER — Other Ambulatory Visit: Payer: Self-pay | Admitting: Family Medicine

## 2016-12-15 DIAGNOSIS — M9901 Segmental and somatic dysfunction of cervical region: Secondary | ICD-10-CM | POA: Diagnosis not present

## 2016-12-15 DIAGNOSIS — M5031 Other cervical disc degeneration,  high cervical region: Secondary | ICD-10-CM | POA: Diagnosis not present

## 2016-12-19 DIAGNOSIS — M5031 Other cervical disc degeneration,  high cervical region: Secondary | ICD-10-CM | POA: Diagnosis not present

## 2016-12-19 DIAGNOSIS — M9901 Segmental and somatic dysfunction of cervical region: Secondary | ICD-10-CM | POA: Diagnosis not present

## 2016-12-20 DIAGNOSIS — M9901 Segmental and somatic dysfunction of cervical region: Secondary | ICD-10-CM | POA: Diagnosis not present

## 2016-12-20 DIAGNOSIS — M5031 Other cervical disc degeneration,  high cervical region: Secondary | ICD-10-CM | POA: Diagnosis not present

## 2016-12-22 DIAGNOSIS — M9901 Segmental and somatic dysfunction of cervical region: Secondary | ICD-10-CM | POA: Diagnosis not present

## 2016-12-22 DIAGNOSIS — M5031 Other cervical disc degeneration,  high cervical region: Secondary | ICD-10-CM | POA: Diagnosis not present

## 2016-12-26 DIAGNOSIS — M9901 Segmental and somatic dysfunction of cervical region: Secondary | ICD-10-CM | POA: Diagnosis not present

## 2016-12-26 DIAGNOSIS — M5031 Other cervical disc degeneration,  high cervical region: Secondary | ICD-10-CM | POA: Diagnosis not present

## 2016-12-28 DIAGNOSIS — M5031 Other cervical disc degeneration,  high cervical region: Secondary | ICD-10-CM | POA: Diagnosis not present

## 2016-12-28 DIAGNOSIS — M9901 Segmental and somatic dysfunction of cervical region: Secondary | ICD-10-CM | POA: Diagnosis not present

## 2016-12-29 DIAGNOSIS — M9901 Segmental and somatic dysfunction of cervical region: Secondary | ICD-10-CM | POA: Diagnosis not present

## 2016-12-29 DIAGNOSIS — M5031 Other cervical disc degeneration,  high cervical region: Secondary | ICD-10-CM | POA: Diagnosis not present

## 2017-01-02 DIAGNOSIS — M5031 Other cervical disc degeneration,  high cervical region: Secondary | ICD-10-CM | POA: Diagnosis not present

## 2017-01-02 DIAGNOSIS — M9901 Segmental and somatic dysfunction of cervical region: Secondary | ICD-10-CM | POA: Diagnosis not present

## 2017-01-04 DIAGNOSIS — M5031 Other cervical disc degeneration,  high cervical region: Secondary | ICD-10-CM | POA: Diagnosis not present

## 2017-01-04 DIAGNOSIS — M9901 Segmental and somatic dysfunction of cervical region: Secondary | ICD-10-CM | POA: Diagnosis not present

## 2017-01-06 ENCOUNTER — Other Ambulatory Visit: Payer: Self-pay | Admitting: Family Medicine

## 2017-01-09 DIAGNOSIS — M9901 Segmental and somatic dysfunction of cervical region: Secondary | ICD-10-CM | POA: Diagnosis not present

## 2017-01-09 DIAGNOSIS — M5031 Other cervical disc degeneration,  high cervical region: Secondary | ICD-10-CM | POA: Diagnosis not present

## 2017-01-11 DIAGNOSIS — M9901 Segmental and somatic dysfunction of cervical region: Secondary | ICD-10-CM | POA: Diagnosis not present

## 2017-01-11 DIAGNOSIS — M5031 Other cervical disc degeneration,  high cervical region: Secondary | ICD-10-CM | POA: Diagnosis not present

## 2017-01-16 DIAGNOSIS — M9901 Segmental and somatic dysfunction of cervical region: Secondary | ICD-10-CM | POA: Diagnosis not present

## 2017-01-16 DIAGNOSIS — M5031 Other cervical disc degeneration,  high cervical region: Secondary | ICD-10-CM | POA: Diagnosis not present

## 2017-01-18 DIAGNOSIS — M9901 Segmental and somatic dysfunction of cervical region: Secondary | ICD-10-CM | POA: Diagnosis not present

## 2017-01-18 DIAGNOSIS — M5031 Other cervical disc degeneration,  high cervical region: Secondary | ICD-10-CM | POA: Diagnosis not present

## 2017-01-26 DIAGNOSIS — M9901 Segmental and somatic dysfunction of cervical region: Secondary | ICD-10-CM | POA: Diagnosis not present

## 2017-01-26 DIAGNOSIS — M5031 Other cervical disc degeneration,  high cervical region: Secondary | ICD-10-CM | POA: Diagnosis not present

## 2017-01-30 DIAGNOSIS — M5031 Other cervical disc degeneration,  high cervical region: Secondary | ICD-10-CM | POA: Diagnosis not present

## 2017-01-30 DIAGNOSIS — M9901 Segmental and somatic dysfunction of cervical region: Secondary | ICD-10-CM | POA: Diagnosis not present

## 2017-02-06 DIAGNOSIS — M5031 Other cervical disc degeneration,  high cervical region: Secondary | ICD-10-CM | POA: Diagnosis not present

## 2017-02-06 DIAGNOSIS — M9901 Segmental and somatic dysfunction of cervical region: Secondary | ICD-10-CM | POA: Diagnosis not present

## 2017-02-13 DIAGNOSIS — M5031 Other cervical disc degeneration,  high cervical region: Secondary | ICD-10-CM | POA: Diagnosis not present

## 2017-02-13 DIAGNOSIS — M9901 Segmental and somatic dysfunction of cervical region: Secondary | ICD-10-CM | POA: Diagnosis not present

## 2017-02-27 DIAGNOSIS — M5031 Other cervical disc degeneration,  high cervical region: Secondary | ICD-10-CM | POA: Diagnosis not present

## 2017-02-27 DIAGNOSIS — M9901 Segmental and somatic dysfunction of cervical region: Secondary | ICD-10-CM | POA: Diagnosis not present

## 2017-03-02 DIAGNOSIS — M5031 Other cervical disc degeneration,  high cervical region: Secondary | ICD-10-CM | POA: Diagnosis not present

## 2017-03-02 DIAGNOSIS — M9901 Segmental and somatic dysfunction of cervical region: Secondary | ICD-10-CM | POA: Diagnosis not present

## 2017-03-08 DIAGNOSIS — M5031 Other cervical disc degeneration,  high cervical region: Secondary | ICD-10-CM | POA: Diagnosis not present

## 2017-03-08 DIAGNOSIS — M9901 Segmental and somatic dysfunction of cervical region: Secondary | ICD-10-CM | POA: Diagnosis not present

## 2017-04-03 ENCOUNTER — Encounter: Payer: Self-pay | Admitting: Family Medicine

## 2017-04-03 ENCOUNTER — Ambulatory Visit (INDEPENDENT_AMBULATORY_CARE_PROVIDER_SITE_OTHER): Payer: Medicare Other | Admitting: Family Medicine

## 2017-04-03 VITALS — BP 112/70 | HR 78 | Temp 98.1°F | Resp 16 | Ht 64.0 in | Wt 124.2 lb

## 2017-04-03 DIAGNOSIS — E785 Hyperlipidemia, unspecified: Secondary | ICD-10-CM | POA: Insufficient documentation

## 2017-04-03 DIAGNOSIS — E119 Type 2 diabetes mellitus without complications: Secondary | ICD-10-CM

## 2017-04-03 DIAGNOSIS — Z0000 Encounter for general adult medical examination without abnormal findings: Secondary | ICD-10-CM | POA: Diagnosis not present

## 2017-04-03 DIAGNOSIS — E038 Other specified hypothyroidism: Secondary | ICD-10-CM | POA: Diagnosis not present

## 2017-04-03 DIAGNOSIS — Z23 Encounter for immunization: Secondary | ICD-10-CM | POA: Diagnosis not present

## 2017-04-03 LAB — BASIC METABOLIC PANEL
BUN: 16 mg/dL (ref 6–23)
CHLORIDE: 100 meq/L (ref 96–112)
CO2: 30 meq/L (ref 19–32)
Calcium: 9.7 mg/dL (ref 8.4–10.5)
Creatinine, Ser: 0.66 mg/dL (ref 0.40–1.20)
GFR: 95.16 mL/min (ref >60.00)
GLUCOSE: 122 mg/dL — AB (ref 70–99)
POTASSIUM: 4.2 meq/L (ref 3.5–5.1)
Sodium: 137 mEq/L (ref 135–145)

## 2017-04-03 LAB — CBC WITH DIFFERENTIAL/PLATELET
BASOS PCT: 0.6 % (ref 0.0–3.0)
Basophils Absolute: 0 10*3/uL (ref 0.0–0.1)
EOS PCT: 2.2 % (ref 0.0–5.0)
Eosinophils Absolute: 0.1 10*3/uL (ref 0.0–0.7)
HEMATOCRIT: 41.5 % (ref 36.0–46.0)
HEMOGLOBIN: 13.8 g/dL (ref 12.0–15.0)
Lymphocytes Relative: 26.8 % (ref 12.0–46.0)
Lymphs Abs: 1.6 10*3/uL (ref 0.7–4.0)
MCHC: 33.4 g/dL (ref 30.0–36.0)
MCV: 88.5 fl (ref 78.0–100.0)
Monocytes Absolute: 0.7 10*3/uL (ref 0.1–1.0)
Monocytes Relative: 11.1 % (ref 3.0–12.0)
Neutro Abs: 3.6 10*3/uL (ref 1.4–7.7)
Neutrophils Relative %: 59.3 % (ref 43.0–77.0)
Platelets: 300 10*3/uL (ref 150.0–400.0)
RBC: 4.69 Mil/uL (ref 3.87–5.11)
RDW: 12.7 % (ref 11.5–15.5)
WBC: 6 10*3/uL (ref 4.0–10.5)

## 2017-04-03 LAB — HEMOGLOBIN A1C: Hgb A1c MFr Bld: 6.5 % (ref 4.6–6.5)

## 2017-04-03 LAB — LIPID PANEL
CHOL/HDL RATIO: 2
Cholesterol: 219 mg/dL — ABNORMAL HIGH (ref 0–200)
HDL: 93.4 mg/dL (ref >39.00)
LDL CALC: 114 mg/dL — AB (ref 0–99)
NONHDL: 125.23
TRIGLYCERIDES: 55 mg/dL (ref 0.0–149.0)
VLDL: 11 mg/dL (ref 0.0–40.0)

## 2017-04-03 LAB — HEPATIC FUNCTION PANEL
ALT: 15 U/L (ref 0–35)
AST: 18 U/L (ref 0–37)
Albumin: 4.4 g/dL (ref 3.5–5.2)
Alkaline Phosphatase: 70 U/L (ref 39–117)
BILIRUBIN TOTAL: 0.6 mg/dL (ref 0.2–1.2)
Bilirubin, Direct: 0.1 mg/dL (ref 0.0–0.3)
Total Protein: 6.8 g/dL (ref 6.0–8.3)

## 2017-04-03 LAB — MICROALBUMIN / CREATININE URINE RATIO
Creatinine,U: 25.8 mg/dL
Microalb Creat Ratio: 2.7 mg/g (ref 0.0–30.0)
Microalb, Ur: <0.7 mg/dL (ref 0.0–1.9)

## 2017-04-03 LAB — TSH: TSH: 1.24 u[IU]/mL (ref 0.35–4.50)

## 2017-04-03 MED ORDER — BUPROPION HCL ER (SR) 100 MG PO TB12: ORAL_TABLET | ORAL | 0 refills | Status: DC

## 2017-04-03 NOTE — Progress Notes (Signed)
   Subjective:    Patient ID: Ashley Shaffer, female    DOB: [birth year], 66 y.o.   MRN: [id number]  HPI Here today for MWV.  Risk Factors: DM- chronic problem, on Metformin and Onglyza.  UTD on eye exam.  Due for foot exam, microalbumin.  Denies symptomatic lows, numbness/tingling of hands/feet.  Denies CP, SOB, HAs, visual changes. Hyperlipidemia- chronic problem, was previously on Lipitor but pt not taking due to side effects.  Exercising regularly.  No abd pain, N/V, myalgias.  Pt has lost 5 lbs since last visit. Hypothyroid- chronic problem, on Levothyroxine daily.  Denies fatigue, cold/heat intolerance, changes to skin/hair/nails Physical Activity: exercising regularly Fall Risk: low Depression: denies Hearing: normal to conversational tones and whispered voice ADL's: independent Cognitive: normal linear thought process, memory and attention intact Home Safety: safe at home Height, Weight, BMI, Visual Acuity: see vitals, vision corrected to 20/20 w/ glasses Counseling: UTD on mammo, due for colonoscopy next year.  Due for pneumovax. Care team reviewed and updated. Labs Ordered: See A&P Care Plan: See A&P   Past medical hx, family hx, social hx, problem list, medications, and allergies reviewed   Review of Systems Patient reports no vision/ hearing changes, adenopathy,fever, weight change,  persistant/recurrent hoarseness , swallowing issues, chest pain, palpitations, edema, persistant/recurrent cough, hemoptysis, dyspnea (rest/exertional/paroxysmal nocturnal), gastrointestinal bleeding (melena, rectal bleeding), abdominal pain, significant heartburn, bowel changes, GU symptoms (dysuria, hematuria, incontinence), Gyn symptoms (abnormal  bleeding, pain),  syncope, focal weakness, memory loss, numbness & tingling, skin/hair/nail changes, abnormal bruising or bleeding, anxiety, or depression.     Objective:   Physical Exam General Appearance:    Alert, cooperative, no distress, appears  stated age  Head:    Normocephalic, without obvious abnormality, atraumatic  Eyes:    PERRL, conjunctiva/corneas clear, EOM's intact, fundi    benign, both eyes  Ears:    Normal TM's and external ear canals, both ears  Nose:   Nares normal, septum midline, mucosa normal, no drainage    or sinus tenderness  Throat:   Lips, mucosa, and tongue normal; teeth and gums normal  Neck:   Supple, symmetrical, trachea midline, no adenopathy;    Thyroid: no enlargement/tenderness/nodules  Back:     Symmetric, no curvature, ROM normal, no CVA tenderness  Lungs:     Clear to auscultation bilaterally, respirations unlabored  Chest Wall:    No tenderness or deformity   Heart:    Regular rate and rhythm, S1 and S2 normal, no murmur, rub   or gallop  Breast Exam:    Deferred to mammo  Abdomen:     Soft, non-tender, bowel sounds active all four quadrants,    no masses, no organomegaly  Genitalia:    Deferred  Rectal:    Extremities:   Extremities normal, atraumatic, no cyanosis or edema  Pulses:   2+ and symmetric all extremities  Skin:   Skin color, texture, turgor normal, no rashes or lesions  Lymph nodes:   Cervical, supraclavicular, and axillary nodes normal  Neurologic:   CNII-XII intact, normal strength, sensation and reflexes    throughout          Assessment & Plan:

## 2017-04-03 NOTE — Assessment & Plan Note (Signed)
Chronic problem.  Pt has stopped lipitor due to side effects.  Check labs.  Adjust meds prn

## 2017-04-03 NOTE — Assessment & Plan Note (Signed)
Chronic problem.  Currently asymptomatic.  Check labs.  Adjust meds prn  

## 2017-04-03 NOTE — Patient Instructions (Signed)
Follow up in 3-4 months to recheck sugars We'll notify you of your lab results and make any changes if needed Continue to work on healthy diet and regular exercise- you look great!!! You are up to date on colonoscopy until next year- yay!!! Call with any questions or concerns Have a great summer!!

## 2017-04-03 NOTE — Assessment & Plan Note (Signed)
Pt's PE WNL.  Reviewed current medical issues, medications, health maintenance.  Pneumovax given today.  Check labs.  Anticipatory guidance provided.

## 2017-04-03 NOTE — Assessment & Plan Note (Signed)
Chronic problem.  On Metformin and Onglyza w/ hx of good control.  UTD on eye exam.  Foot exam done today.  Microalbumin ordered.  Applauded healthy diet and regular exercise.  Check labs.  Adjust meds prn

## 2017-04-03 NOTE — Progress Notes (Signed)
Pre visit review using our clinic review tool, if applicable. No additional management support is needed unless otherwise documented below in the visit note. 

## 2017-04-05 ENCOUNTER — Other Ambulatory Visit: Payer: Self-pay | Admitting: General Practice

## 2017-04-05 MED ORDER — LEVOTHYROXINE SODIUM 150 MCG PO TABS: 150.0000 ug | ORAL_TABLET | Freq: Every day | ORAL | 1 refills | Status: DC

## 2017-04-26 ENCOUNTER — Other Ambulatory Visit: Payer: Self-pay | Admitting: Family Medicine

## 2017-05-22 ENCOUNTER — Other Ambulatory Visit: Payer: Self-pay | Admitting: Family Medicine

## 2017-06-07 ENCOUNTER — Other Ambulatory Visit: Payer: Self-pay | Admitting: Family Medicine

## 2017-07-10 DIAGNOSIS — M1711 Unilateral primary osteoarthritis, right knee: Secondary | ICD-10-CM | POA: Diagnosis not present

## 2017-07-17 DIAGNOSIS — M1711 Unilateral primary osteoarthritis, right knee: Secondary | ICD-10-CM | POA: Diagnosis not present

## 2017-07-18 ENCOUNTER — Ambulatory Visit (INDEPENDENT_AMBULATORY_CARE_PROVIDER_SITE_OTHER): Payer: Medicare Other | Admitting: Family Medicine

## 2017-07-18 ENCOUNTER — Encounter: Payer: Self-pay | Admitting: Family Medicine

## 2017-07-18 VITALS — BP 110/76 | HR 64 | Temp 98.1°F | Resp 16 | Ht 64.0 in | Wt 128.5 lb

## 2017-07-18 DIAGNOSIS — E119 Type 2 diabetes mellitus without complications: Secondary | ICD-10-CM

## 2017-07-18 DIAGNOSIS — Z23 Encounter for immunization: Secondary | ICD-10-CM

## 2017-07-18 LAB — BASIC METABOLIC PANEL
BUN: 16 mg/dL (ref 6–23)
CALCIUM: 9.6 mg/dL (ref 8.4–10.5)
CO2: 31 mEq/L (ref 19–32)
Chloride: 99 mEq/L (ref 96–112)
Creatinine, Ser: 0.62 mg/dL (ref 0.40–1.20)
GFR: 102.19 mL/min (ref >60.00)
GLUCOSE: 136 mg/dL — AB (ref 70–99)
Potassium: 4.2 mEq/L (ref 3.5–5.1)
Sodium: 137 mEq/L (ref 135–145)

## 2017-07-18 LAB — HEMOGLOBIN A1C: Hgb A1c MFr Bld: 6.4 % (ref 4.6–6.5)

## 2017-07-18 MED ORDER — METFORMIN HCL ER 500 MG PO TB24: 1000.0000 mg | ORAL_TABLET | Freq: Two times a day (BID) | ORAL | 1 refills | Status: DC

## 2017-07-18 NOTE — Progress Notes (Signed)
   Subjective:    Patient ID: Ashley Shaffer, female    DOB: [birth year], 66 y.o.   MRN: [id number]  HPI DM- chronic problem.  On Metformin 1000mg  BID and Onglyza 5mg  daily.  Last A1C 6.5.  Pt has gained nearly 5 lbs since last visit.  UTD on foot exam and microalbumin.  Due for eye exam- pt plans to schedule.  Continues to exercise.  No symptomatic lows.  No numbness/tingling of hands/feet.  No CP, SOB, HAs, visual changes, abd pain, N/V.   Review of Systems For ROS see HPI     Objective:   Physical Exam  Constitutional: She is oriented to person, place, and time. She appears well-developed and well-nourished. No distress.  HENT:  Head: Normocephalic and atraumatic.  Eyes: Pupils are equal, round, and reactive to light. Conjunctivae and EOM are normal.  Neck: Normal range of motion. Neck supple. No thyromegaly present.  Cardiovascular: Normal rate, regular rhythm, normal heart sounds and intact distal pulses.   No murmur heard. Pulmonary/Chest: Effort normal and breath sounds normal. No respiratory distress.  Abdominal: Soft. She exhibits no distension. There is no tenderness.  Musculoskeletal: She exhibits no edema.  Lymphadenopathy:    She has no cervical adenopathy.  Neurological: She is alert and oriented to person, place, and time.  Skin: Skin is warm and dry.  Psychiatric: She has a normal mood and affect. Her behavior is normal.  Vitals reviewed.         Assessment & Plan:

## 2017-07-18 NOTE — Patient Instructions (Signed)
Follow up in 3-4 months to recheck diabetes and cholesterol We'll notify you of your lab results and make any changes if needed Continue to work on healthy diet and regular exercise- you look great!!! Please schedule your eye exam Call with any questions or concerns Happy Fall!!!

## 2017-07-18 NOTE — Progress Notes (Signed)
Pre visit review using our clinic review tool, if applicable. No additional management support is needed unless otherwise documented below in the visit note. 

## 2017-07-18 NOTE — Assessment & Plan Note (Signed)
Chronic problem.  On Onglyza and Metformin.  UTD on microalbumin.  Due for eye exam- pt plans to schedule.  UTD on foot exam.  Check labs.  Adjust meds prn

## 2017-07-19 ENCOUNTER — Encounter: Payer: Self-pay | Admitting: General Practice

## 2017-07-24 DIAGNOSIS — M1711 Unilateral primary osteoarthritis, right knee: Secondary | ICD-10-CM | POA: Diagnosis not present

## 2017-08-07 DIAGNOSIS — E119 Type 2 diabetes mellitus without complications: Secondary | ICD-10-CM | POA: Diagnosis not present

## 2017-08-07 DIAGNOSIS — Z7984 Long term (current) use of oral hypoglycemic drugs: Secondary | ICD-10-CM | POA: Diagnosis not present

## 2017-08-07 DIAGNOSIS — H31092 Other chorioretinal scars, left eye: Secondary | ICD-10-CM | POA: Diagnosis not present

## 2017-08-07 DIAGNOSIS — H2513 Age-related nuclear cataract, bilateral: Secondary | ICD-10-CM | POA: Diagnosis not present

## 2017-08-07 DIAGNOSIS — H524 Presbyopia: Secondary | ICD-10-CM | POA: Diagnosis not present

## 2017-08-07 LAB — HM DIABETES EYE EXAM

## 2017-08-15 ENCOUNTER — Encounter: Payer: Self-pay | Admitting: General Practice

## 2017-09-27 ENCOUNTER — Other Ambulatory Visit: Payer: Self-pay | Admitting: Family Medicine

## 2017-09-27 DIAGNOSIS — Z139 Encounter for screening, unspecified: Secondary | ICD-10-CM

## 2017-09-27 DIAGNOSIS — M1711 Unilateral primary osteoarthritis, right knee: Secondary | ICD-10-CM | POA: Diagnosis not present

## 2017-09-29 ENCOUNTER — Other Ambulatory Visit: Payer: Self-pay | Admitting: Family Medicine

## 2017-10-02 ENCOUNTER — Other Ambulatory Visit: Payer: Self-pay

## 2017-10-02 ENCOUNTER — Ambulatory Visit (INDEPENDENT_AMBULATORY_CARE_PROVIDER_SITE_OTHER): Payer: Medicare Other | Admitting: Family Medicine

## 2017-10-02 ENCOUNTER — Encounter: Payer: Self-pay | Admitting: Family Medicine

## 2017-10-02 VITALS — BP 112/68 | HR 68 | Temp 98.0°F | Resp 16 | Ht 64.0 in | Wt 128.5 lb

## 2017-10-02 DIAGNOSIS — E559 Vitamin D deficiency, unspecified: Secondary | ICD-10-CM | POA: Diagnosis not present

## 2017-10-02 DIAGNOSIS — Z79899 Other long term (current) drug therapy: Secondary | ICD-10-CM

## 2017-10-02 DIAGNOSIS — R5383 Other fatigue: Secondary | ICD-10-CM

## 2017-10-02 LAB — BASIC METABOLIC PANEL
BUN: 15 mg/dL (ref 6–23)
CALCIUM: 9.8 mg/dL (ref 8.4–10.5)
CO2: 29 mEq/L (ref 19–32)
CREATININE: 0.59 mg/dL (ref 0.40–1.20)
Chloride: 100 mEq/L (ref 96–112)
GFR: 108.14 mL/min (ref >60.00)
Glucose, Bld: 163 mg/dL — ABNORMAL HIGH (ref 70–99)
Potassium: 4.1 mEq/L (ref 3.5–5.1)
SODIUM: 137 meq/L (ref 135–145)

## 2017-10-02 LAB — CBC WITH DIFFERENTIAL/PLATELET
BASOS PCT: 0.3 % (ref 0.0–3.0)
Basophils Absolute: 0 10*3/uL (ref 0.0–0.1)
EOS ABS: 0.1 10*3/uL (ref 0.0–0.7)
Eosinophils Relative: 1.7 % (ref 0.0–5.0)
HEMATOCRIT: 40 % (ref 36.0–46.0)
Hemoglobin: 13.3 g/dL (ref 12.0–15.0)
LYMPHS PCT: 21.3 % (ref 12.0–46.0)
Lymphs Abs: 1.8 10*3/uL (ref 0.7–4.0)
MCHC: 33.2 g/dL (ref 30.0–36.0)
MCV: 89.6 fl (ref 78.0–100.0)
MONOS PCT: 8.3 % (ref 3.0–12.0)
Monocytes Absolute: 0.7 10*3/uL (ref 0.1–1.0)
NEUTROS ABS: 5.7 10*3/uL (ref 1.4–7.7)
Neutrophils Relative %: 68.4 % (ref 43.0–77.0)
PLATELETS: 309 10*3/uL (ref 150.0–400.0)
RBC: 4.47 Mil/uL (ref 3.87–5.11)
RDW: 12.8 % (ref 11.5–15.5)
WBC: 8.4 10*3/uL (ref 4.0–10.5)

## 2017-10-02 LAB — HEPATIC FUNCTION PANEL
ALT: 15 U/L (ref 0–35)
AST: 16 U/L (ref 0–37)
Albumin: 4.4 g/dL (ref 3.5–5.2)
Alkaline Phosphatase: 66 U/L (ref 39–117)
BILIRUBIN DIRECT: 0.1 mg/dL (ref 0.0–0.3)
BILIRUBIN TOTAL: 0.5 mg/dL (ref 0.2–1.2)
Total Protein: 6.7 g/dL (ref 6.0–8.3)

## 2017-10-02 LAB — B12 AND FOLATE PANEL
Folate: 10.8 ng/mL (ref >5.9)
Vitamin B-12: 664 pg/mL (ref 211–911)

## 2017-10-02 LAB — TSH: TSH: 1.82 u[IU]/mL (ref 0.35–4.50)

## 2017-10-02 LAB — VITAMIN D 25 HYDROXY (VIT D DEFICIENCY, FRACTURES): VITD: 31.26 ng/mL (ref 30.00–100.00)

## 2017-10-02 NOTE — Progress Notes (Signed)
   Subjective:    Patient ID: Ashley Shaffer, female    DOB: [birth year], 66 y.o.   MRN: [id number]  HPI Fatigue- 'i'm just exhausted'.  sxs started ~2 weeks ago.  'I sleep 9 hrs and I need 9 more'.  Drinking coffee and taking naps to get through the day.  No medication changes.  Pt is concerned that Metformin has depleted B12.  No fevers.  Mildly increased stress but pt reports 'life is good'.  No CP, SOB, HAs, abd pain, N/V.  Pt vaguely remembers being told she had a vitamin deficiency but can't say for certain.   Review of Systems For ROS see HPI     Objective:   Physical Exam  Constitutional: She is oriented to person, place, and time. She appears well-developed and well-nourished. No distress.  HENT:  Head: Normocephalic and atraumatic.  Eyes: Conjunctivae and EOM are normal. Pupils are equal, round, and reactive to light.  Neck: Normal range of motion. Neck supple. No thyromegaly present.  Cardiovascular: Normal rate, regular rhythm, normal heart sounds and intact distal pulses.  No murmur heard. Pulmonary/Chest: Effort normal and breath sounds normal. No respiratory distress.  Abdominal: Soft. She exhibits no distension. There is no tenderness.  Musculoskeletal: She exhibits no edema.  Lymphadenopathy:    She has no cervical adenopathy.  Neurological: She is alert and oriented to person, place, and time.  Skin: Skin is warm and dry.  Psychiatric: She has a normal mood and affect. Her behavior is normal.  Vitals reviewed.         Assessment & Plan:  Fatigue- new.  Pt has had extreme fatigue x2 weeks.  No changes to meds, no other physical sxs.  Hx of hypothyroid and vitamin deficiency in the past.  Plus, she is on Metformin that could have lowered her B12 level.  Check labs.  tx abnormalities if present.  Discussed that this time of year can be stressful and that internalizing this can cause fatigue.  She expressed understanding.  Will follow.

## 2017-10-02 NOTE — Patient Instructions (Signed)
Follow up as needed or as scheduled We'll notify you of your lab results and make any changes if needed Rest when you need to! Call with any questions or concerns Travel safely!!! Happy Holidays!!!

## 2017-10-03 ENCOUNTER — Encounter: Payer: Self-pay | Admitting: Family Medicine

## 2017-10-04 DIAGNOSIS — M1711 Unilateral primary osteoarthritis, right knee: Secondary | ICD-10-CM | POA: Diagnosis not present

## 2017-10-04 MED ORDER — BUPROPION HCL ER (SR) 150 MG PO TB12: 150.0000 mg | ORAL_TABLET | Freq: Two times a day (BID) | ORAL | 1 refills | Status: DC

## 2017-11-03 ENCOUNTER — Ambulatory Visit
Admission: RE | Admit: 2017-11-03 | Discharge: 2017-11-03 | Disposition: A | Discharge status: Home / Self Care | Payer: Medicare Other | Source: Ambulatory Visit | Attending: Family Medicine | Admitting: Family Medicine

## 2017-11-03 ENCOUNTER — Encounter: Payer: Self-pay | Admitting: Family Medicine

## 2017-11-03 DIAGNOSIS — Z1231 Encounter for screening mammogram for malignant neoplasm of breast: Secondary | ICD-10-CM | POA: Diagnosis not present

## 2017-11-03 DIAGNOSIS — Z139 Encounter for screening, unspecified: Secondary | ICD-10-CM

## 2017-11-06 ENCOUNTER — Encounter: Payer: Self-pay | Admitting: Family Medicine

## 2017-11-07 MED ORDER — SITAGLIPTIN PHOSPHATE 100 MG PO TABS: 100.0000 mg | ORAL_TABLET | Freq: Every day | ORAL | 1 refills | Status: DC

## 2017-11-08 ENCOUNTER — Other Ambulatory Visit: Payer: Self-pay | Admitting: General Practice

## 2017-11-08 MED ORDER — METFORMIN HCL ER 500 MG PO TB24: 1000.0000 mg | ORAL_TABLET | Freq: Two times a day (BID) | ORAL | 1 refills | Status: DC

## 2017-11-13 ENCOUNTER — Encounter: Payer: Self-pay | Admitting: Family Medicine

## 2017-11-13 ENCOUNTER — Other Ambulatory Visit: Payer: Self-pay

## 2017-11-13 ENCOUNTER — Ambulatory Visit (INDEPENDENT_AMBULATORY_CARE_PROVIDER_SITE_OTHER): Payer: Medicare HMO | Admitting: Family Medicine

## 2017-11-13 VITALS — BP 110/81 | HR 64 | Temp 98.1°F | Resp 16 | Ht 64.0 in | Wt 130.5 lb

## 2017-11-13 DIAGNOSIS — E119 Type 2 diabetes mellitus without complications: Secondary | ICD-10-CM

## 2017-11-13 DIAGNOSIS — E785 Hyperlipidemia, unspecified: Secondary | ICD-10-CM | POA: Diagnosis not present

## 2017-11-13 LAB — CBC WITH DIFFERENTIAL/PLATELET
Basophils Absolute: 0 10*3/uL (ref 0.0–0.1)
Basophils Relative: 0.5 % (ref 0.0–3.0)
EOS ABS: 0.2 10*3/uL (ref 0.0–0.7)
EOS PCT: 3.3 % (ref 0.0–5.0)
HEMATOCRIT: 42.5 % (ref 36.0–46.0)
HEMOGLOBIN: 14.2 g/dL (ref 12.0–15.0)
LYMPHS PCT: 27.7 % (ref 12.0–46.0)
Lymphs Abs: 1.9 10*3/uL (ref 0.7–4.0)
MCHC: 33.4 g/dL (ref 30.0–36.0)
MCV: 90.9 fl (ref 78.0–100.0)
Monocytes Absolute: 0.6 10*3/uL (ref 0.1–1.0)
Monocytes Relative: 9.4 % (ref 3.0–12.0)
NEUTROS ABS: 4 10*3/uL (ref 1.4–7.7)
Neutrophils Relative %: 59.1 % (ref 43.0–77.0)
PLATELETS: 337 10*3/uL (ref 150.0–400.0)
RBC: 4.68 Mil/uL (ref 3.87–5.11)
RDW: 12.9 % (ref 11.5–15.5)
WBC: 6.8 10*3/uL (ref 4.0–10.5)

## 2017-11-13 LAB — LIPID PANEL
CHOLESTEROL: 219 mg/dL — AB (ref 0–200)
HDL: 92 mg/dL (ref >39.00)
LDL Cholesterol: 107 mg/dL — ABNORMAL HIGH (ref 0–99)
NonHDL: 126.81
TRIGLYCERIDES: 98 mg/dL (ref 0.0–149.0)
Total CHOL/HDL Ratio: 2
VLDL: 19.6 mg/dL (ref 0.0–40.0)

## 2017-11-13 LAB — HEMOGLOBIN A1C: HEMOGLOBIN A1C: 6.7 % — AB (ref 4.6–6.5)

## 2017-11-13 LAB — BASIC METABOLIC PANEL
BUN: 19 mg/dL (ref 6–23)
CALCIUM: 9.7 mg/dL (ref 8.4–10.5)
CO2: 33 meq/L — AB (ref 19–32)
Chloride: 99 mEq/L (ref 96–112)
Creatinine, Ser: 0.75 mg/dL (ref 0.40–1.20)
GFR: 81.96 mL/min (ref >60.00)
GLUCOSE: 146 mg/dL — AB (ref 70–99)
Potassium: 3.9 mEq/L (ref 3.5–5.1)
SODIUM: 139 meq/L (ref 135–145)

## 2017-11-13 LAB — HEPATIC FUNCTION PANEL
ALBUMIN: 4.5 g/dL (ref 3.5–5.2)
ALK PHOS: 85 U/L (ref 39–117)
ALT: 16 U/L (ref 0–35)
AST: 15 U/L (ref 0–37)
Bilirubin, Direct: 0.1 mg/dL (ref 0.0–0.3)
TOTAL PROTEIN: 6.9 g/dL (ref 6.0–8.3)
Total Bilirubin: 0.5 mg/dL (ref 0.2–1.2)

## 2017-11-13 LAB — TSH: TSH: 1.67 u[IU]/mL (ref 0.35–4.50)

## 2017-11-13 NOTE — Progress Notes (Signed)
   Subjective:    Patient ID: Ashley Shaffer, female    DOB: 06-05-1951, 67 y.o.   MRN: [id number]  HPI DM- chronic problem, on Metformin and Januvia w/ hx of good control.  UTD on foot exam, eye exam, and microalbumin.  Denies CP, SOB, HAs, visual changes, edema, numbness/tingling of hands/feet, symptomatic lows, abd pain, N/V.  Hyperlipidemia- chronic problem, last LDL was 114.  Not currently on statin.  Pt exercising regularly.   Review of Systems For ROS see HPI     Objective:   Physical Exam  Constitutional: She is oriented to person, place, and time. She appears well-developed and well-nourished. No distress.  HENT:  Head: Normocephalic and atraumatic.  Eyes: Conjunctivae and EOM are normal. Pupils are equal, round, and reactive to light.  Neck: Normal range of motion. Neck supple. No thyromegaly present.  Cardiovascular: Normal rate, regular rhythm, normal heart sounds and intact distal pulses.  No murmur heard. Pulmonary/Chest: Effort normal and breath sounds normal. No respiratory distress.  Abdominal: Soft. She exhibits no distension. There is no tenderness.  Musculoskeletal: She exhibits no edema.  Lymphadenopathy:    She has no cervical adenopathy.  Neurological: She is alert and oriented to person, place, and time.  Skin: Skin is warm and dry.  Psychiatric: She has a normal mood and affect. Her behavior is normal.  Vitals reviewed.         Assessment & Plan:

## 2017-11-13 NOTE — Patient Instructions (Signed)
Follow up in 3-4 months to recheck diabetes We'll notify you of your lab results and make any changes if needed Keep up the good work on healthy diet and regular exercise- you look great!!! Call with any questions or concerns Happy New Year!!!

## 2017-11-13 NOTE — Assessment & Plan Note (Signed)
Chronic problem.  Not currently on statin.  Has been attempting to control w/ diet and exercise.  Check labs and start meds prn.

## 2017-11-13 NOTE — Assessment & Plan Note (Signed)
Chronic problem.  Tolerating meds w/o difficulty.  UTD on eye exam, foot exam, and microalbumin.  Check labs.  Adjust meds prn

## 2017-11-14 ENCOUNTER — Encounter: Payer: Self-pay | Admitting: General Practice

## 2017-12-20 ENCOUNTER — Other Ambulatory Visit: Payer: Self-pay | Admitting: General Practice

## 2017-12-20 MED ORDER — BUPROPION HCL ER (SR) 150 MG PO TB12: 150.0000 mg | ORAL_TABLET | Freq: Two times a day (BID) | ORAL | 1 refills | Status: DC

## 2017-12-31 ENCOUNTER — Encounter: Payer: Self-pay | Admitting: Family Medicine

## 2018-01-01 MED ORDER — BUPROPION HCL ER (SR) 100 MG PO TB12: 100.0000 mg | ORAL_TABLET | Freq: Two times a day (BID) | ORAL | 1 refills | Status: DC

## 2018-01-10 ENCOUNTER — Other Ambulatory Visit: Payer: Self-pay | Admitting: General Practice

## 2018-01-10 MED ORDER — LEVOTHYROXINE SODIUM 150 MCG PO TABS: 150.0000 ug | ORAL_TABLET | Freq: Every day | ORAL | 1 refills | Status: DC

## 2018-01-21 ENCOUNTER — Encounter: Payer: Self-pay | Admitting: Family Medicine

## 2018-01-22 MED ORDER — SAXAGLIPTIN HCL 5 MG PO TABS: 5.0000 mg | ORAL_TABLET | Freq: Every day | ORAL | 1 refills | Status: DC

## 2018-02-08 ENCOUNTER — Encounter: Payer: Self-pay | Admitting: Family Medicine

## 2018-02-09 ENCOUNTER — Ambulatory Visit: Payer: Medicare HMO | Admitting: Family Medicine

## 2018-02-09 DIAGNOSIS — Z0289 Encounter for other administrative examinations: Secondary | ICD-10-CM

## 2018-02-12 ENCOUNTER — Encounter: Payer: Self-pay | Admitting: Family Medicine

## 2018-02-12 ENCOUNTER — Other Ambulatory Visit: Payer: Self-pay

## 2018-02-12 ENCOUNTER — Ambulatory Visit (INDEPENDENT_AMBULATORY_CARE_PROVIDER_SITE_OTHER): Payer: Medicare HMO | Admitting: Family Medicine

## 2018-02-12 VITALS — BP 110/81 | HR 66 | Temp 98.1°F | Resp 16 | Ht 64.0 in | Wt 133.2 lb

## 2018-02-12 DIAGNOSIS — L600 Ingrowing nail: Secondary | ICD-10-CM | POA: Diagnosis not present

## 2018-02-12 MED ORDER — AMOXICILLIN-POT CLAVULANATE 875-125 MG PO TABS: 1.0000 | ORAL_TABLET | Freq: Two times a day (BID) | ORAL | 0 refills | Status: DC

## 2018-02-12 NOTE — Progress Notes (Signed)
   Subjective:    Patient ID: Ashley Shaffer, female    DOB: [birth year], 67 y.o.   MRN: [id number]  HPI Cellulitis toe- L great toe.  Pt has had some numbness and dull ache in toe since January.  Last week had a pedicure and on Thursday she woke w/ swollen toe and very painful.  Has been on Augmentin twice daily since Thursday.  Toe is no longer painful, no longer red.  No drainage.   Review of Systems For ROS see HPI     Objective:   Physical Exam  Constitutional: She appears well-developed and well-nourished. No distress.  Cardiovascular: Intact distal pulses.  Skin: Skin is warm and dry. No erythema.  No redness or fluctuance around the great toe nails bilaterally  Psychiatric: She has a normal mood and affect. Her behavior is normal.  Vitals reviewed.         Assessment & Plan:  Cellulitis- resolving since taking her left over Augmentin.  Asymptomatic at this time.  Given her diabetes, will provide 5 additional days of medication to complete a 10 day course.  Reviewed supportive care and red flags that should prompt return.  Pt expressed understanding and is in agreement w/ plan.

## 2018-02-12 NOTE — Patient Instructions (Signed)
Follow up as needed or as scheduled Finish 5 more days of antibiotics for a total of 10 days- take w/ food!!! Ice the bottom of the foot for relief of swelling and tenderness Call with any questions or concerns Enjoy vacation!!!

## 2018-02-28 ENCOUNTER — Ambulatory Visit: Payer: Medicare HMO | Admitting: Family Medicine

## 2018-03-08 ENCOUNTER — Encounter: Payer: Self-pay | Admitting: Family Medicine

## 2018-03-08 ENCOUNTER — Other Ambulatory Visit: Payer: Self-pay

## 2018-03-08 ENCOUNTER — Ambulatory Visit (INDEPENDENT_AMBULATORY_CARE_PROVIDER_SITE_OTHER): Payer: Medicare HMO | Admitting: Family Medicine

## 2018-03-08 ENCOUNTER — Encounter: Payer: Self-pay | Admitting: General Practice

## 2018-03-08 VITALS — BP 110/70 | HR 72 | Temp 98.0°F | Resp 16 | Ht 64.0 in | Wt 133.5 lb

## 2018-03-08 DIAGNOSIS — E119 Type 2 diabetes mellitus without complications: Secondary | ICD-10-CM

## 2018-03-08 LAB — BASIC METABOLIC PANEL
BUN: 19 mg/dL (ref 6–23)
CHLORIDE: 100 meq/L (ref 96–112)
CO2: 29 mEq/L (ref 19–32)
Calcium: 9.3 mg/dL (ref 8.4–10.5)
Creatinine, Ser: 0.66 mg/dL (ref 0.40–1.20)
GFR: 94.89 mL/min (ref >60.00)
Glucose, Bld: 198 mg/dL — ABNORMAL HIGH (ref 70–99)
Potassium: 4.1 mEq/L (ref 3.5–5.1)
Sodium: 135 mEq/L (ref 135–145)

## 2018-03-08 LAB — MICROALBUMIN / CREATININE URINE RATIO
Creatinine,U: 38.9 mg/dL
Microalb Creat Ratio: 1.8 mg/g (ref 0.0–30.0)
Microalb, Ur: 0.7 mg/dL (ref 0.0–1.9)

## 2018-03-08 LAB — HEMOGLOBIN A1C: Hgb A1c MFr Bld: 7.2 % — ABNORMAL HIGH (ref 4.6–6.5)

## 2018-03-08 NOTE — Progress Notes (Signed)
   Subjective:    Patient ID: Ashley Shaffer, female    DOB: 1950-11-13, 67 y.o.   MRN: [id number]  HPI DM- chronic problem, on Metformin 1000mg  BID, Onglyza 5mg  daily.  Due for Microalbumin next month, foot exam (has appt upcoming w/ Dr Lindley Magnus).  UTD on eye exam.  No CP, SOB, HAs, visual changes, edema, numbness/tingling of hands/feet.  No symptomatic lows.   Review of Systems For ROS see HPI     Objective:   Physical Exam  Constitutional: She is oriented to person, place, and time. She appears well-developed and well-nourished. No distress.  HENT:  Head: Normocephalic and atraumatic.  Eyes: Pupils are equal, round, and reactive to light. Conjunctivae and EOM are normal.  Neck: Normal range of motion. Neck supple. No thyromegaly present.  Cardiovascular: Normal rate, regular rhythm, normal heart sounds and intact distal pulses.  No murmur heard. Pulmonary/Chest: Effort normal and breath sounds normal. No respiratory distress.  Abdominal: Soft. She exhibits no distension. There is no tenderness.  Musculoskeletal: She exhibits no edema.  Lymphadenopathy:    She has no cervical adenopathy.  Neurological: She is alert and oriented to person, place, and time.  Skin: Skin is warm and dry.  Psychiatric: She has a normal mood and affect. Her behavior is normal.  Vitals reviewed.         Assessment & Plan:

## 2018-03-08 NOTE — Assessment & Plan Note (Signed)
Chronic problem.  Hx of excellent control.  Asymptomatic.  UTD on eye exam.  Foot exam scheduled for Monday w/ Dr Gershon Mussel.  Check microalbumin.  Check labs.  Adjust meds prn

## 2018-03-08 NOTE — Patient Instructions (Signed)
Schedule your complete physical in 3-4 months We'll notify you of your lab results and make any changes if needed Keep up the good work on healthy diet and regular exercise- you look great!!! Have Dr Gershon Mussel send me a copy of your foot exam Call with any questions or concerns Happy Mother's Day!!!

## 2018-03-12 ENCOUNTER — Telehealth: Payer: Self-pay | Admitting: Family Medicine

## 2018-03-12 DIAGNOSIS — M2012 Hallux valgus (acquired), left foot: Secondary | ICD-10-CM | POA: Diagnosis not present

## 2018-03-12 DIAGNOSIS — L600 Ingrowing nail: Secondary | ICD-10-CM

## 2018-03-12 NOTE — Telephone Encounter (Signed)
This referral was placed.  

## 2018-03-12 NOTE — Telephone Encounter (Signed)
Pt asking for a referral to Dr. [facility], for callus and ingrown toe nail on left foot.

## 2018-03-19 ENCOUNTER — Ambulatory Visit: Payer: Medicare HMO | Admitting: Podiatry

## 2018-03-28 ENCOUNTER — Encounter: Payer: Self-pay | Admitting: Podiatry

## 2018-03-28 ENCOUNTER — Ambulatory Visit: Payer: Self-pay

## 2018-03-28 ENCOUNTER — Ambulatory Visit: Payer: Medicare HMO | Admitting: Podiatry

## 2018-03-28 DIAGNOSIS — M21612 Bunion of left foot: Secondary | ICD-10-CM

## 2018-03-28 NOTE — Patient Instructions (Signed)
Pre-Operative Instructions  Congratulations, you have decided to take an important step towards improving your quality of life.  You can be assured that the doctors and staff at [facility] will be with you every step of the way.  Here are some important things you should know:  1. Plan to be at the surgery center/hospital at least 1 (one) hour prior to your scheduled time, unless otherwise directed by the surgical center/hospital staff.  You must have a responsible adult accompany you, remain during the surgery and drive you home.  Make sure you have directions to the surgical center/hospital to ensure you arrive on time. 2. If you are having surgery at [facility] or [facility] hospitals, you will need a copy of your medical history and physical form from your family physician within one month prior to the date of surgery. We will give you a form for your primary physician to complete.  3. We make every effort to accommodate the date you request for surgery.  However, there are times where surgery dates or times have to be moved.  We will contact you as soon as possible if a change in schedule is required.   4. No aspirin/ibuprofen for one week before surgery.  If you are on aspirin, any non-steroidal anti-inflammatory medications (Mobic, Aleve, Ibuprofen) should not be taken seven (7) days prior to your surgery.  You make take Tylenol for pain prior to surgery.  5. Medications - If you are taking daily heart and blood pressure medications, seizure, reflux, allergy, asthma, anxiety, pain or diabetes medications, make sure you notify the surgery center/hospital before the day of surgery so they can tell you which medications you should take or avoid the day of surgery. 6. No food or drink after midnight the night before surgery unless directed otherwise by surgical center/hospital staff. 7. No alcoholic beverages 24-hours prior to surgery.  No smoking 24-hours prior or 24-hours after  surgery. 8. Wear loose pants or shorts. They should be loose enough to fit over bandages, boots, and casts. 9. Don't wear slip-on shoes. Sneakers are preferred. 10. Bring your boot with you to the surgery center/hospital.  Also bring crutches or a walker if your physician has prescribed it for you.  If you do not have this equipment, it will be provided for you after surgery. 11. If you have not been contacted by the surgery center/hospital by the day before your surgery, call to confirm the date and time of your surgery. 12. Leave-time from work may vary depending on the type of surgery you have.  Appropriate arrangements should be made prior to surgery with your employer. 13. Prescriptions will be provided immediately following surgery by your doctor.  Fill these as soon as possible after surgery and take the medication as directed. Pain medications will not be refilled on weekends and must be approved by the doctor. 14. Remove nail polish on the operative foot and avoid getting pedicures prior to surgery. 15. Wash the night before surgery.  The night before surgery wash the foot and leg well with water and the antibacterial soap provided. Be sure to pay special attention to beneath the toenails and in between the toes.  Wash for at least three (3) minutes. Rinse thoroughly with water and dry well with a towel.  Perform this wash unless told not to do so by your physician.  Enclosed: 1 Ice pack (please put in freezer the night before surgery)   1 Hibiclens skin cleaner     Pre-op instructions  If you have any questions regarding the instructions, please do not hesitate to call our office.  [place]: [address], [place], [place] [address] -- [phone]  [place]: [address] [place], [place] [address] -- [phone]  [place]: [address]  [place], [place] [address] -- [phone]  [place]: [address], [address], [place], [place] [address] -- [phone]  Website:  [email or web address] 

## 2018-03-31 NOTE — Progress Notes (Signed)
   Subjective: 67 year old female presenting today as a new patient with a chief complaint of a painful bunion deformity of the left foot that has been symptomatic for the past several months. She was referred by Dr. Gershon Mussel. She states she had the right bunion surgically corrected 20 years ago. Walking increases the pain. She has not done anything for treatment. Patient is here for further evaluation and treatment.    Past Medical History:  Diagnosis Date  . Basal cell carcinoma   . Depression   . Diabetes mellitus without complication ([place])   . Hyperlipidemia   . Ischemic colitis ([place])   . Left ACL tear   . Thyroid disease       Objective: Physical Exam General: The patient is alert and oriented x3 in no acute distress.  Dermatology: Skin is cool, dry and supple bilateral lower extremities. Negative for open lesions or macerations.  Vascular: Palpable pedal pulses bilaterally. No edema or erythema noted. Capillary refill within normal limits.  Neurological: Epicritic and protective threshold grossly intact bilaterally.   Musculoskeletal Exam: Clinical evidence of bunion deformity noted to the respective foot. There is a moderate pain on palpation range of motion of the first MPJ. Lateral deviation of the hallux noted consistent with hallux abductovalgus.   Assessment: 1. HAV w/ bunion deformity left    Plan of Care:  1. Patient was evaluated. X-Rays that patient brought were reviewed. 2. Today we discussed the conservative versus surgical management of bunion deformity. 3. She wants to have surgery in the fall.  4. Return to clinic in 3 months for surgical consult.   Rides dressage horses.    Edrick Kins, DPM [facility]  Dr. Edrick Kins, [address]                                        [place], [place] [address]                Office [phone]  Fax [phone]

## 2018-05-06 ENCOUNTER — Other Ambulatory Visit: Payer: Self-pay | Admitting: Family Medicine

## 2018-05-28 ENCOUNTER — Other Ambulatory Visit: Payer: Self-pay | Admitting: Family Medicine

## 2018-07-06 DIAGNOSIS — M1711 Unilateral primary osteoarthritis, right knee: Secondary | ICD-10-CM | POA: Diagnosis not present

## 2018-07-12 ENCOUNTER — Ambulatory Visit (INDEPENDENT_AMBULATORY_CARE_PROVIDER_SITE_OTHER): Payer: Medicare HMO | Admitting: Family Medicine

## 2018-07-12 ENCOUNTER — Other Ambulatory Visit: Payer: Self-pay

## 2018-07-12 ENCOUNTER — Encounter: Payer: Self-pay | Admitting: Family Medicine

## 2018-07-12 VITALS — BP 112/80 | HR 78 | Temp 98.0°F | Resp 16 | Ht 64.0 in | Wt 132.4 lb

## 2018-07-12 DIAGNOSIS — E119 Type 2 diabetes mellitus without complications: Secondary | ICD-10-CM | POA: Diagnosis not present

## 2018-07-12 DIAGNOSIS — M79641 Pain in right hand: Secondary | ICD-10-CM | POA: Diagnosis not present

## 2018-07-12 DIAGNOSIS — Z0000 Encounter for general adult medical examination without abnormal findings: Secondary | ICD-10-CM | POA: Diagnosis not present

## 2018-07-12 DIAGNOSIS — Z23 Encounter for immunization: Secondary | ICD-10-CM

## 2018-07-12 DIAGNOSIS — M79642 Pain in left hand: Secondary | ICD-10-CM

## 2018-07-12 LAB — LIPID PANEL
CHOLESTEROL: 197 mg/dL (ref 0–200)
HDL: 77 mg/dL (ref >39.00)
LDL Cholesterol: 90 mg/dL (ref 0–99)
NONHDL: 120.42
TRIGLYCERIDES: 154 mg/dL — AB (ref 0.0–149.0)
Total CHOL/HDL Ratio: 3
VLDL: 30.8 mg/dL (ref 0.0–40.0)

## 2018-07-12 LAB — CBC WITH DIFFERENTIAL/PLATELET
BASOS ABS: 0 10*3/uL (ref 0.0–0.1)
BASOS PCT: 0.4 % (ref 0.0–3.0)
Eosinophils Absolute: 0.2 10*3/uL (ref 0.0–0.7)
Eosinophils Relative: 2.4 % (ref 0.0–5.0)
HCT: 41.7 % (ref 36.0–46.0)
HEMOGLOBIN: 14 g/dL (ref 12.0–15.0)
LYMPHS ABS: 2 10*3/uL (ref 0.7–4.0)
Lymphocytes Relative: 22.1 % (ref 12.0–46.0)
MCHC: 33.7 g/dL (ref 30.0–36.0)
MCV: 87.5 fl (ref 78.0–100.0)
MONO ABS: 0.7 10*3/uL (ref 0.1–1.0)
Monocytes Relative: 7.5 % (ref 3.0–12.0)
NEUTROS PCT: 67.6 % (ref 43.0–77.0)
Neutro Abs: 6.3 10*3/uL (ref 1.4–7.7)
Platelets: 310 10*3/uL (ref 150.0–400.0)
RBC: 4.77 Mil/uL (ref 3.87–5.11)
RDW: 12.6 % (ref 11.5–15.5)
WBC: 9.3 10*3/uL (ref 4.0–10.5)

## 2018-07-12 LAB — HEPATIC FUNCTION PANEL
ALT: 15 U/L (ref 0–35)
AST: 13 U/L (ref 0–37)
Albumin: 4.5 g/dL (ref 3.5–5.2)
Alkaline Phosphatase: 74 U/L (ref 39–117)
BILIRUBIN TOTAL: 0.4 mg/dL (ref 0.2–1.2)
Bilirubin, Direct: 0.1 mg/dL (ref 0.0–0.3)
TOTAL PROTEIN: 6.9 g/dL (ref 6.0–8.3)

## 2018-07-12 LAB — BASIC METABOLIC PANEL
BUN: 19 mg/dL (ref 6–23)
CO2: 27 meq/L (ref 19–32)
CREATININE: 0.6 mg/dL (ref 0.40–1.20)
Calcium: 9.9 mg/dL (ref 8.4–10.5)
Chloride: 100 mEq/L (ref 96–112)
GFR: 105.82 mL/min (ref >60.00)
GLUCOSE: 158 mg/dL — AB (ref 70–99)
Potassium: 4.2 mEq/L (ref 3.5–5.1)
Sodium: 136 mEq/L (ref 135–145)

## 2018-07-12 LAB — TSH: TSH: 0.9 u[IU]/mL (ref 0.35–4.50)

## 2018-07-12 LAB — HEMOGLOBIN A1C: HEMOGLOBIN A1C: 6.6 % — AB (ref 4.6–6.5)

## 2018-07-12 NOTE — Patient Instructions (Signed)
Follow up in 3-4 months to recheck diabetes We'll notify you of your lab results and make any changes if needed Keep up the good work on healthy diet and regular exercise- you look great!!! Have them send me a copy of your eye exam Call with any questions or concerns Happy Fall!!!

## 2018-07-12 NOTE — Assessment & Plan Note (Signed)
Chronic problem.  Hx of good control.  UTD on eye exam, microalbumin.  Foot exam done today.  Check labs.  Adjust meds prn

## 2018-07-12 NOTE — Progress Notes (Signed)
   Subjective:    Patient ID: Ashley Shaffer, female    DOB: Nov 22, 1950, 67 y.o.   MRN: [id number]  HPI CPE- UTD on mammo.  Due for flu shot.  Had colonoscopy 07/05/12- due 2023.  Due for foot exam.   Review of Systems Patient reports no vision/ hearing changes, adenopathy,fever, weight change,  persistant/recurrent hoarseness , swallowing issues, chest pain, palpitations, edema, persistant/recurrent cough, hemoptysis, dyspnea (rest/exertional/paroxysmal nocturnal), gastrointestinal bleeding (melena, rectal bleeding), abdominal pain, significant heartburn, bowel changes, GU symptoms (dysuria, hematuria, incontinence), Gyn symptoms (abnormal  bleeding, pain),  syncope, focal weakness, memory loss, numbness & tingling, skin/hair/nail changes, abnormal bruising or bleeding, anxiety, or depression.   + hand pain bilaterally w/ painful nodularity    Objective:   Physical Exam General Appearance:    Alert, cooperative, no distress, appears stated age  Head:    Normocephalic, without obvious abnormality, atraumatic  Eyes:    PERRL, conjunctiva/corneas clear, EOM's intact, fundi    benign, both eyes  Ears:    Normal TM's and external ear canals, both ears  Nose:   Nares normal, septum midline, mucosa normal, no drainage    or sinus tenderness  Throat:   Lips, mucosa, and tongue normal; teeth and gums normal  Neck:   Supple, symmetrical, trachea midline, no adenopathy;    Thyroid: no enlargement/tenderness/nodules  Back:     Symmetric, no curvature, ROM normal, no CVA tenderness  Lungs:     Clear to auscultation bilaterally, respirations unlabored  Chest Wall:    No tenderness or deformity   Heart:    Regular rate and rhythm, S1 and S2 normal, no murmur, rub   or gallop  Breast Exam:    Deferred to GYN  Abdomen:     Soft, non-tender, bowel sounds active all four quadrants,    no masses, no organomegaly  Genitalia:    Deferred to GYN  Rectal:    Extremities:   Nodularity of hands bilaterally w/  slight ulnar deviation  Pulses:   2+ and symmetric all extremities  Skin:   Skin color, texture, turgor normal, no rashes or lesions  Lymph nodes:   Cervical, supraclavicular, and axillary nodes normal  Neurologic:   CNII-XII intact, normal strength, sensation and reflexes    throughout          Assessment & Plan:

## 2018-07-12 NOTE — Assessment & Plan Note (Signed)
Pt's PE WNL w/ exception of nodularity of hands and ulnar deviation.  UTD on mammo.  Flu shot given.  Colonoscopy not due until 2023.  Check labs.  Anticipatory guidance provided.

## 2018-07-13 LAB — CYCLIC CITRUL PEPTIDE ANTIBODY, IGG: Cyclic Citrullin Peptide Ab: <16 UNITS

## 2018-07-13 LAB — RHEUMATOID FACTOR: Rhuematoid fact SerPl-aCnc: <14 IU/mL (ref <14)

## 2018-07-30 DIAGNOSIS — M7062 Trochanteric bursitis, left hip: Secondary | ICD-10-CM | POA: Diagnosis not present

## 2018-08-13 DIAGNOSIS — H2513 Age-related nuclear cataract, bilateral: Secondary | ICD-10-CM | POA: Diagnosis not present

## 2018-08-13 DIAGNOSIS — H5203 Hypermetropia, bilateral: Secondary | ICD-10-CM | POA: Diagnosis not present

## 2018-08-13 DIAGNOSIS — E119 Type 2 diabetes mellitus without complications: Secondary | ICD-10-CM | POA: Diagnosis not present

## 2018-09-15 LAB — HM DIABETES EYE EXAM

## 2018-10-01 ENCOUNTER — Other Ambulatory Visit: Payer: Self-pay | Admitting: Family Medicine

## 2018-10-01 DIAGNOSIS — Z1231 Encounter for screening mammogram for malignant neoplasm of breast: Secondary | ICD-10-CM

## 2018-10-07 ENCOUNTER — Encounter: Payer: Self-pay | Admitting: Family Medicine

## 2018-10-08 ENCOUNTER — Other Ambulatory Visit: Payer: Self-pay

## 2018-10-08 ENCOUNTER — Ambulatory Visit (INDEPENDENT_AMBULATORY_CARE_PROVIDER_SITE_OTHER): Payer: Medicare HMO | Admitting: Physician Assistant

## 2018-10-08 ENCOUNTER — Encounter: Payer: Self-pay | Admitting: Physician Assistant

## 2018-10-08 ENCOUNTER — Ambulatory Visit: Payer: Medicare HMO | Admitting: Podiatry

## 2018-10-08 VITALS — BP 120/70 | HR 74 | Temp 97.8°F | Resp 14 | Ht 64.0 in | Wt 134.0 lb

## 2018-10-08 DIAGNOSIS — B9689 Other specified bacterial agents as the cause of diseases classified elsewhere: Secondary | ICD-10-CM

## 2018-10-08 DIAGNOSIS — J019 Acute sinusitis, unspecified: Secondary | ICD-10-CM

## 2018-10-08 MED ORDER — AMOXICILLIN-POT CLAVULANATE 875-125 MG PO TABS: 1.0000 | ORAL_TABLET | Freq: Two times a day (BID) | ORAL | 0 refills | Status: DC

## 2018-10-08 MED ORDER — HYDROCOD POLST-CPM POLST ER 10-8 MG/5ML PO SUER: 5.0000 mL | Freq: Two times a day (BID) | ORAL | 0 refills | Status: DC | PRN

## 2018-10-08 NOTE — Progress Notes (Signed)
Patient presents to clinic today c/o worsening chest congestion with dry cough, sinus pain, ear pressure and facial pressure with headache. Denies fevers, chills, chest pain. Does not some shortness of breath with exertion only. Has taken Mucinex and Ibuprofen for symptoms. Denies recent travel or sick contact.    Past Medical History:  Diagnosis Date  . Basal cell carcinoma   . Depression   . Diabetes mellitus without complication ([place])   . Hyperlipidemia   . Ischemic colitis ([place])   . Left ACL tear   . Thyroid disease     Current Outpatient Medications on File Prior to Visit  Medication Sig Dispense Refill  . buPROPion (WELLBUTRIN SR) 100 MG 12 hr tablet TAKE 1 TABLET TWICE DAILY 180 tablet 1  . levothyroxine (SYNTHROID, LEVOTHROID) 150 MCG tablet TAKE 1 TABLET (150 MCG TOTAL) BY MOUTH DAILY BEFORE BREAKFAST. 90 tablet 1  . metFORMIN (GLUCOPHAGE-XR) 500 MG 24 hr tablet TAKE 2 TABLETS (1,000 MG TOTAL) BY MOUTH 2 (TWO) TIMES DAILY. 360 tablet 1  . ONE TOUCH ULTRA TEST test strip USE AS DIRECTED TO TEST GLUCOSE LEVELS THREE TIMES DAILY 100 each 3  . ONGLYZA 5 MG TABS tablet TAKE 1 TABLET (5 MG TOTAL) BY MOUTH DAILY. 90 tablet 1   No current facility-administered medications on file prior to visit.     No Known Allergies  Family History  Problem Relation Age of Onset  . Heart disease Mother   . Healthy Sister     Social History   Socioeconomic History  . Marital status: Married    Spouse name: Not on file  . Number of children: Not on file  . Years of education: Not on file  . Highest education level: Not on file  Occupational History  . Not on file  Social Needs  . Financial resource strain: Not on file  . Food insecurity:    Worry: Not on file    Inability: Not on file  . Transportation needs:    Medical: Not on file    Non-medical: Not on file  Tobacco Use  . Smoking status: Never Smoker  . Smokeless tobacco: Never Used  Substance and Sexual Activity  .  Alcohol use: Yes  . Drug use: No  . Sexual activity: Yes  Lifestyle  . Physical activity:    Days per week: Not on file    Minutes per session: Not on file  . Stress: Not on file  Relationships  . Social connections:    Talks on phone: Not on file    Gets together: Not on file    Attends religious service: Not on file    Active member of club or organization: Not on file    Attends meetings of clubs or organizations: Not on file    Relationship status: Not on file  Other Topics Concern  . Not on file  Social History Narrative  . Not on file   Review of Systems - See HPI.  All other ROS are negative.  BP 120/70   Pulse 74   Temp 97.8 F (36.6 C) (Oral)   Resp 14   Ht 5\' 4"  (1.626 m)   Wt 134 lb (60.8 kg)   SpO2 99%   BMI 23.00 kg/m   Physical Exam  Constitutional: She is oriented to person, place, and time. She appears well-developed and well-nourished.  HENT:  Head: Normocephalic and atraumatic.  Right Ear: Tympanic membrane and external ear normal.  Left Ear: Tympanic membrane  and external ear normal.  Nose: Mucosal edema and rhinorrhea present. Right sinus exhibits maxillary sinus tenderness and frontal sinus tenderness. Left sinus exhibits no maxillary sinus tenderness and no frontal sinus tenderness.  Eyes: Conjunctivae are normal.  Neck: Neck supple.  Cardiovascular: Normal rate, regular rhythm, normal heart sounds and intact distal pulses.  Pulmonary/Chest: Effort normal and breath sounds normal.  Lymphadenopathy:    She has no cervical adenopathy.  Neurological: She is alert and oriented to person, place, and time.  Psychiatric: She has a normal mood and affect.  Vitals reviewed.   Recent Results (from the past 2160 hour(s))  Hemoglobin A1c     Status: Abnormal   Collection Time: 07/12/18  1:57 PM  Result Value Ref Range   Hgb A1c MFr Bld 6.6 (H) 4.6 - 6.5 %    Comment: Glycemic Control Guidelines for People with Diabetes:Non Diabetic:  <6%Goal of  Therapy: <7%Additional Action Suggested:  >8%   Lipid panel     Status: Abnormal   Collection Time: 07/12/18  1:57 PM  Result Value Ref Range   Cholesterol 197 0 - 200 mg/dL    Comment: ATP III Classification       Desirable:  < 200 mg/dL               Borderline High:  200 - 239 mg/dL          High:  > = 240 mg/dL   Triglycerides 154.0 (H) 0.0 - 149.0 mg/dL    Comment: Normal:  <150 mg/dLBorderline High:  150 - 199 mg/dL   HDL 77.00 >39.00 mg/dL   VLDL 30.8 0.0 - 40.0 mg/dL   LDL Cholesterol 90 0 - 99 mg/dL   Total CHOL/HDL Ratio 3     Comment:                Men          Women1/2 Average Risk     3.4          3.3Average Risk          5.0          4.42X Average Risk          9.6          7.13X Average Risk          15.0          11.0                       NonHDL 120.42     Comment: NOTE:  Non-HDL goal should be 30 mg/dL higher than patient's LDL goal (i.e. LDL goal of < 70 mg/dL, would have non-HDL goal of < 100 mg/dL)  Basic metabolic panel     Status: Abnormal   Collection Time: 07/12/18  1:57 PM  Result Value Ref Range   Sodium 136 135 - 145 mEq/L   Potassium 4.2 3.5 - 5.1 mEq/L   Chloride 100 96 - 112 mEq/L   CO2 27 19 - 32 mEq/L   Glucose, Bld 158 (H) 70 - 99 mg/dL   BUN 19 6 - 23 mg/dL   Creatinine, Ser 0.60 0.40 - 1.20 mg/dL   Calcium 9.9 8.4 - 10.5 mg/dL   GFR 105.82 >60.00 mL/min  TSH     Status: None   Collection Time: 07/12/18  1:57 PM  Result Value Ref Range   TSH 0.90 0.35 - 4.50 uIU/mL  Hepatic function panel  Status: None   Collection Time: 07/12/18  1:57 PM  Result Value Ref Range   Total Bilirubin 0.4 0.2 - 1.2 mg/dL   Bilirubin, Direct 0.1 0.0 - 0.3 mg/dL   Alkaline Phosphatase 74 39 - 117 U/L   AST 13 0 - 37 U/L   ALT 15 0 - 35 U/L   Total Protein 6.9 6.0 - 8.3 g/dL   Albumin 4.5 3.5 - 5.2 g/dL  CBC with Differential/Platelet     Status: None   Collection Time: 07/12/18  1:57 PM  Result Value Ref Range   WBC 9.3 4.0 - 10.5 K/uL   RBC 4.77 3.87 -  5.11 Mil/uL   Hemoglobin 14.0 12.0 - 15.0 g/dL   HCT 41.7 36.0 - 46.0 %   MCV 87.5 78.0 - 100.0 fl   MCHC 33.7 30.0 - 36.0 g/dL   RDW 12.6 11.5 - 15.5 %   Platelets 310.0 150.0 - 400.0 K/uL   Neutrophils Relative % 67.6 43.0 - 77.0 %   Lymphocytes Relative 22.1 12.0 - 46.0 %   Monocytes Relative 7.5 3.0 - 12.0 %   Eosinophils Relative 2.4 0.0 - 5.0 %   Basophils Relative 0.4 0.0 - 3.0 %   Neutro Abs 6.3 1.4 - 7.7 K/uL   Lymphs Abs 2.0 0.7 - 4.0 K/uL   Monocytes Absolute 0.7 0.1 - 1.0 K/uL   Eosinophils Absolute 0.2 0.0 - 0.7 K/uL   Basophils Absolute 0.0 0.0 - 0.1 K/uL  Rheumatoid Factor     Status: None   Collection Time: 07/12/18  2:18 PM  Result Value Ref Range   Rhuematoid fact SerPl-aCnc <41 <28 IU/mL  Cyclic citrul peptide antibody, IgG     Status: None   Collection Time: 07/12/18  2:18 PM  Result Value Ref Range   Cyclic Citrullin Peptide Ab <16 UNITS    Comment: Reference Range Negative:            <20 Weak Positive:       20-39 Moderate Positive:   40-59 Strong Positive:     >59 .     Assessment/Plan: 1. Acute bacterial sinusitis Rx Augmentin.  Increase fluids.  Rest.  Saline nasal spray.  Probiotic.  Mucinex as directed.  Humidifier in bedroom. Tussionex per orders.  Call or return to clinic if symptoms are not improving.  - amoxicillin-clavulanate (AUGMENTIN) 875-125 MG tablet; Take 1 tablet by mouth 2 (two) times daily.  Dispense: 14 tablet; Refill: 0 - chlorpheniramine-HYDROcodone (TUSSIONEX PENNKINETIC ER) 10-8 MG/5ML SUER; Take 5 mLs by mouth every 12 (twelve) hours as needed.  Dispense: 140 mL; Refill: 0   Leeanne Rio, PA-C

## 2018-10-08 NOTE — Patient Instructions (Signed)
Please take antibiotic as directed.  Increase fluid intake.  Use Saline nasal spray.  Take a daily multivitamin. Take the cough medication as directed. Continue plain Mucinex.  Place a humidifier in the bedroom.  Please call or return clinic if symptoms are not improving.  Sinusitis Sinusitis is redness, soreness, and swelling (inflammation) of the paranasal sinuses. Paranasal sinuses are air pockets within the bones of your face (beneath the eyes, the middle of the forehead, or above the eyes). In healthy paranasal sinuses, mucus is able to drain out, and air is able to circulate through them by way of your nose. However, when your paranasal sinuses are inflamed, mucus and air can become trapped. This can allow bacteria and other germs to grow and cause infection. Sinusitis can develop quickly and last only a short time (acute) or continue over a long period (chronic). Sinusitis that lasts for more than 12 weeks is considered chronic.  CAUSES  Causes of sinusitis include:  Allergies.  Structural abnormalities, such as displacement of the cartilage that separates your nostrils (deviated septum), which can decrease the air flow through your nose and sinuses and affect sinus drainage.  Functional abnormalities, such as when the small hairs (cilia) that line your sinuses and help remove mucus do not work properly or are not present. SYMPTOMS  Symptoms of acute and chronic sinusitis are the same. The primary symptoms are pain and pressure around the affected sinuses. Other symptoms include:  Upper toothache.  Earache.  Headache.  Bad breath.  Decreased sense of smell and taste.  A cough, which worsens when you are lying flat.  Fatigue.  Fever.  Thick drainage from your nose, which often is green and may contain pus (purulent).  Swelling and warmth over the affected sinuses. DIAGNOSIS  Your caregiver will perform a physical exam. During the exam, your caregiver may:  Look in your  nose for signs of abnormal growths in your nostrils (nasal polyps).  Tap over the affected sinus to check for signs of infection.  View the inside of your sinuses (endoscopy) with a special imaging device with a light attached (endoscope), which is inserted into your sinuses. If your caregiver suspects that you have chronic sinusitis, one or more of the following tests may be recommended:  Allergy tests.  Nasal culture A sample of mucus is taken from your nose and sent to a lab and screened for bacteria.  Nasal cytology A sample of mucus is taken from your nose and examined by your caregiver to determine if your sinusitis is related to an allergy. TREATMENT  Most cases of acute sinusitis are related to a viral infection and will resolve on their own within 10 days. Sometimes medicines are prescribed to help relieve symptoms (pain medicine, decongestants, nasal steroid sprays, or saline sprays).  However, for sinusitis related to a bacterial infection, your caregiver will prescribe antibiotic medicines. These are medicines that will help kill the bacteria causing the infection.  Rarely, sinusitis is caused by a fungal infection. In theses cases, your caregiver will prescribe antifungal medicine. For some cases of chronic sinusitis, surgery is needed. Generally, these are cases in which sinusitis recurs more than 3 times per year, despite other treatments. HOME CARE INSTRUCTIONS   Drink plenty of water. Water helps thin the mucus so your sinuses can drain more easily.  Use a humidifier.  Inhale steam 3 to 4 times a day (for example, sit in the bathroom with the shower running).  Apply a warm, moist washcloth  to your face 3 to 4 times a day, or as directed by your caregiver.  Use saline nasal sprays to help moisten and clean your sinuses.  Take over-the-counter or prescription medicines for pain, discomfort, or fever only as directed by your caregiver. SEEK IMMEDIATE MEDICAL CARE  IF:  You have increasing pain or severe headaches.  You have nausea, vomiting, or drowsiness.  You have swelling around your face.  You have vision problems.  You have a stiff neck.  You have difficulty breathing. MAKE SURE YOU:   Understand these instructions.  Will watch your condition.  Will get help right away if you are not doing well or get worse. Document Released: 10/17/2005 Document Revised: 01/09/2012 Document Reviewed: 11/01/2011 [facility] Patient Information 2014 [place], [place].

## 2018-10-11 ENCOUNTER — Encounter: Payer: Self-pay | Admitting: Family Medicine

## 2018-10-11 MED ORDER — GLUCOSE BLOOD VI STRP: ORAL_STRIP | 3 refills | Status: DC

## 2018-10-16 ENCOUNTER — Encounter: Payer: Self-pay | Admitting: Physician Assistant

## 2018-10-16 DIAGNOSIS — J019 Acute sinusitis, unspecified: Principal | ICD-10-CM

## 2018-10-16 DIAGNOSIS — B9689 Other specified bacterial agents as the cause of diseases classified elsewhere: Secondary | ICD-10-CM

## 2018-10-17 ENCOUNTER — Encounter: Payer: Self-pay | Admitting: Family Medicine

## 2018-10-17 MED ORDER — DOXYCYCLINE HYCLATE 100 MG PO CAPS: 100.0000 mg | ORAL_CAPSULE | Freq: Two times a day (BID) | ORAL | 0 refills | Status: DC

## 2018-10-19 ENCOUNTER — Other Ambulatory Visit: Payer: Self-pay | Admitting: General Practice

## 2018-10-19 MED ORDER — GLUCOSE BLOOD VI STRP: ORAL_STRIP | 3 refills | Status: DC

## 2018-10-19 MED ORDER — ACCU-CHEK AVIVA PLUS W/DEVICE KIT: PACK | 2 refills | Status: DC

## 2018-11-05 ENCOUNTER — Other Ambulatory Visit: Payer: Self-pay | Admitting: Family Medicine

## 2018-11-05 ENCOUNTER — Telehealth: Payer: Self-pay | Admitting: Family Medicine

## 2018-11-05 MED ORDER — ACCU-CHEK SOFT TOUCH LANCETS MISC: 1 refills | Status: DC

## 2018-11-05 NOTE — Telephone Encounter (Signed)
Copied from [place] [id number]. Topic: Quick Communication - See Telephone Encounter >> Nov 05, 2018  1:54 PM Conception Chancy, NT wrote: CRM for notification. See Telephone encounter for: 11/05/18.  Colletta [place] calling from [facility] and states they sent a request for Accu Chek Soft Click Lancets on 41/14/64 and have not received the prescription. Please advise.  [address], [place] [address] Phone: [phone] Fax: [phone]

## 2018-11-05 NOTE — Telephone Encounter (Signed)
I had not received this previously. Filled today

## 2018-11-12 ENCOUNTER — Ambulatory Visit
Admission: RE | Admit: 2018-11-12 | Discharge: 2018-11-12 | Disposition: A | Discharge status: Home / Self Care | Payer: Medicare HMO | Source: Ambulatory Visit | Attending: Family Medicine | Admitting: Family Medicine

## 2018-11-12 DIAGNOSIS — Z1231 Encounter for screening mammogram for malignant neoplasm of breast: Secondary | ICD-10-CM

## 2018-11-14 ENCOUNTER — Telehealth: Payer: Self-pay | Admitting: Family Medicine

## 2018-11-14 MED ORDER — ACCU-CHEK SOFTCLIX LANCETS MISC: 3 refills | Status: DC

## 2018-11-14 NOTE — Telephone Encounter (Signed)
Copied from [place] [id number]. Topic: Quick Communication - See Telephone Encounter >> Nov 14, 2018 11:03 AM Bea Graff, NT wrote: CRM for notification. See Telephone encounter for: 11/14/18. Ariana with [facility] states they do not carry the Lancets (ACCU-CHEK SOFT TOUCH) lancets and would like to see if the rx can be changed to the Soft Click? CB#: [phone] Ref#: [id number]

## 2018-11-14 NOTE — Telephone Encounter (Signed)
Medication filled to pharmacy as requested.   

## 2018-11-15 ENCOUNTER — Other Ambulatory Visit: Payer: Self-pay

## 2018-11-15 ENCOUNTER — Encounter: Payer: Self-pay | Admitting: General Practice

## 2018-11-15 ENCOUNTER — Encounter: Payer: Self-pay | Admitting: Family Medicine

## 2018-11-15 ENCOUNTER — Ambulatory Visit (INDEPENDENT_AMBULATORY_CARE_PROVIDER_SITE_OTHER): Payer: Medicare HMO | Admitting: Family Medicine

## 2018-11-15 VITALS — BP 121/71 | HR 61 | Temp 98.1°F | Resp 16 | Ht 64.0 in | Wt 133.2 lb

## 2018-11-15 DIAGNOSIS — E119 Type 2 diabetes mellitus without complications: Secondary | ICD-10-CM | POA: Diagnosis not present

## 2018-11-15 LAB — BASIC METABOLIC PANEL
BUN: 17 mg/dL (ref 6–23)
CALCIUM: 10 mg/dL (ref 8.4–10.5)
CO2: 29 mEq/L (ref 19–32)
Chloride: 99 mEq/L (ref 96–112)
Creatinine, Ser: 0.67 mg/dL (ref 0.40–1.20)
GFR: 93.07 mL/min (ref >60.00)
Glucose, Bld: 139 mg/dL — ABNORMAL HIGH (ref 70–99)
Potassium: 4.2 mEq/L (ref 3.5–5.1)
Sodium: 136 mEq/L (ref 135–145)

## 2018-11-15 LAB — HEMOGLOBIN A1C: Hgb A1c MFr Bld: 6.4 % (ref 4.6–6.5)

## 2018-11-15 NOTE — Progress Notes (Signed)
   Subjective:    Patient ID: Ashley Shaffer, female    DOB: 1950-11-20, 68 y.o.   MRN: [id number]  HPI DM- chronic problem, on Metformin 1000mg  BID and Onglyza 5mg  daily w/ hx of good control.  UTD on foot exam, microalbumin, eye exam.  'i'm fabulous'.  No CP, SOB, HAs, visual changes, edema.  No abd pain, N/V.  No numbness/tingling of hands/feet.  No symptomatic lows.   Review of Systems For ROS see HPI     Objective:   Physical Exam Vitals signs reviewed.  Constitutional:      General: She is not in acute distress.    Appearance: Normal appearance. She is well-developed.  HENT:     Head: Normocephalic and atraumatic.  Eyes:     Conjunctiva/sclera: Conjunctivae normal.     Pupils: Pupils are equal, round, and reactive to light.  Neck:     Musculoskeletal: Normal range of motion and neck supple.     Thyroid: No thyromegaly.  Cardiovascular:     Rate and Rhythm: Normal rate and regular rhythm.     Heart sounds: Normal heart sounds. No murmur.  Pulmonary:     Effort: Pulmonary effort is normal. No respiratory distress.     Breath sounds: Normal breath sounds.  Abdominal:     General: There is no distension.     Palpations: Abdomen is soft.     Tenderness: There is no abdominal tenderness.  Lymphadenopathy:     Cervical: No cervical adenopathy.  Skin:    General: Skin is warm and dry.  Neurological:     Mental Status: She is alert and oriented to person, place, and time.  Psychiatric:        Behavior: Behavior normal.           Assessment & Plan:

## 2018-11-15 NOTE — Assessment & Plan Note (Signed)
Chronic problem.  Tolerating Metformin w/o difficulty.  UTD on eye exam, foot exam.  Hx of good control.  Asymptomatic.  Check labs.  Adjust meds prn

## 2018-11-15 NOTE — Patient Instructions (Signed)
Follow up in 3-4 months to recheck sugars and cholesterol We'll notify you of your lab results and make any changes if needed Keep up the good work on healthy diet and regular exercise- you look great! Call with any questions or concerns Happy New Year!!!

## 2019-01-12 ENCOUNTER — Other Ambulatory Visit: Payer: Self-pay | Admitting: Family Medicine

## 2019-01-14 ENCOUNTER — Encounter: Payer: Self-pay | Admitting: Family Medicine

## 2019-01-14 NOTE — Telephone Encounter (Signed)
Please advise, warning came up that there are preferred medications per insurance instead of onglyza

## 2019-01-15 MED ORDER — SAXAGLIPTIN HCL 5 MG PO TABS: 5.0000 mg | ORAL_TABLET | Freq: Every day | ORAL | 1 refills | Status: DC

## 2019-01-17 ENCOUNTER — Other Ambulatory Visit: Payer: Self-pay | Admitting: Family Medicine

## 2019-02-11 DIAGNOSIS — M25562 Pain in left knee: Secondary | ICD-10-CM | POA: Diagnosis not present

## 2019-02-18 ENCOUNTER — Ambulatory Visit: Payer: Medicare HMO | Admitting: Family Medicine

## 2019-02-18 DIAGNOSIS — M1712 Unilateral primary osteoarthritis, left knee: Secondary | ICD-10-CM | POA: Diagnosis not present

## 2019-02-25 DIAGNOSIS — M1712 Unilateral primary osteoarthritis, left knee: Secondary | ICD-10-CM | POA: Diagnosis not present

## 2019-03-01 ENCOUNTER — Other Ambulatory Visit: Payer: Self-pay

## 2019-03-01 ENCOUNTER — Encounter: Payer: Self-pay | Admitting: Family Medicine

## 2019-03-01 ENCOUNTER — Ambulatory Visit (INDEPENDENT_AMBULATORY_CARE_PROVIDER_SITE_OTHER): Payer: Medicare HMO | Admitting: Family Medicine

## 2019-03-01 VITALS — HR 95 | Ht 65.0 in | Wt 134.0 lb

## 2019-03-01 DIAGNOSIS — E119 Type 2 diabetes mellitus without complications: Secondary | ICD-10-CM | POA: Diagnosis not present

## 2019-03-01 NOTE — Progress Notes (Signed)
   Virtual Visit via Video   I connected with patient on 03/01/19 at  4:00 PM EDT by a video enabled telemedicine application and verified that I am speaking with the correct person using two identifiers.  Location patient: Home Location provider: [facility], Office Persons participating in the virtual visit: Patient, Provider, [place] (Jess B)  I discussed the limitations of evaluation and management by telemedicine and the availability of in person appointments. The patient expressed understanding and agreed to proceed.  Subjective:   HPI:   DM- chronic problem, on Metformin XR '1000mg'$  BID and Onglyza '5mg'$  daily w/ hx of good control.  UTD on eye exam, foot exam.  Due for microalbumin.  Weight is stable.  Very physically active.  Denies CP, SOB, HAs, visual changes, abd pain, N/V.  No symptomatic lows.  No numbness/tingling of hands/feet.  Pt admits increased snacking recently.  ROS:   See pertinent positives and negatives per HPI.  Patient Active Problem List   Diagnosis Date Noted  . Hyperlipidemia 04/03/2017  . Retinal scar of left eye 06/29/2016  . Physical exam 04/01/2016  . Diabetes mellitus type II, controlled, with no complications ([place]) 41/66/0630  . Hypothyroid 01/27/2016  . Depression 01/27/2016  . Idiopathic scoliosis 01/27/2016    Social History   Tobacco Use  . Smoking status: Never Smoker  . Smokeless tobacco: Never Used  Substance Use Topics  . Alcohol use: Yes    Current Outpatient Medications:  .  ACCU-CHEK SOFTCLIX LANCETS lancets, Use one lancet each time sugars are tested. Dx. E11.9, Disp: 300 each, Rfl: 3 .  Blood Glucose Monitoring Suppl (ACCU-CHEK AVIVA PLUS) w/Device KIT, Please use new glucometer to check sugar levels daily. Dx E11.9, Disp: 1 kit, Rfl: 2 .  buPROPion (WELLBUTRIN SR) 100 MG 12 hr tablet, TAKE 1 TABLET TWICE DAILY, Disp: 180 tablet, Rfl: 1 .  glucose blood (ACCU-CHEK AVIVA PLUS) test strip, Please use one strip each time  sugars are tested. Pt checks sugars three times daily. Dx E11.9, Disp: 300 each, Rfl: 3 .  levothyroxine (SYNTHROID, LEVOTHROID) 150 MCG tablet, TAKE 1 TABLET EVERY DAY BEFORE BREAKFAST, Disp: 90 tablet, Rfl: 1 .  metFORMIN (GLUCOPHAGE-XR) 500 MG 24 hr tablet, TAKE 2 TABLETS (1,000 MG TOTAL) BY MOUTH 2 (TWO) TIMES DAILY., Disp: 360 tablet, Rfl: 1 .  saxagliptin HCl (ONGLYZA) 5 MG TABS tablet, Take 1 tablet (5 mg total) by mouth daily., Disp: 90 tablet, Rfl: 1  No Known Allergies  Objective:   Pulse 95   Ht '5\' 5"'$  (1.651 m)   Wt 134 lb (60.8 kg)   BMI 22.30 kg/m  AAOx3, NAD NCAT, EOMI No obvious CN deficits Coloring WNL Pt is able to speak clearly, coherently without shortness of breath or increased work of breathing.  Thought process is linear.  Mood is appropriate.   Assessment and Plan:   DM- chronic problem.  Hx of good control.  Asymptomatic.  UTD on foot exam, eye exam.  Due for microalbumin.  Check labs.  Adjust meds prn.  Annye Asa, MD 03/01/2019

## 2019-03-01 NOTE — Progress Notes (Signed)
I have discussed the procedure for the virtual visit with the patient who has given consent to proceed with assessment and treatment.   Ashley Shaffer L Panfilo Ketchum, CMA     

## 2019-03-04 ENCOUNTER — Ambulatory Visit: Payer: Medicare HMO | Admitting: Podiatry

## 2019-03-04 DIAGNOSIS — M1712 Unilateral primary osteoarthritis, left knee: Secondary | ICD-10-CM | POA: Diagnosis not present

## 2019-03-05 ENCOUNTER — Other Ambulatory Visit: Payer: Medicare HMO

## 2019-03-06 ENCOUNTER — Other Ambulatory Visit (INDEPENDENT_AMBULATORY_CARE_PROVIDER_SITE_OTHER): Payer: Medicare HMO

## 2019-03-06 DIAGNOSIS — E119 Type 2 diabetes mellitus without complications: Secondary | ICD-10-CM

## 2019-03-06 LAB — MICROALBUMIN / CREATININE URINE RATIO
Creatinine,U: 58.4 mg/dL
Microalb Creat Ratio: 1.9 mg/g (ref 0.0–30.0)
Microalb, Ur: 1.1 mg/dL (ref 0.0–1.9)

## 2019-03-06 LAB — BASIC METABOLIC PANEL
BUN: 19 mg/dL (ref 6–23)
CO2: 28 mEq/L (ref 19–32)
Calcium: 9.4 mg/dL (ref 8.4–10.5)
Chloride: 99 mEq/L (ref 96–112)
Creatinine, Ser: 0.67 mg/dL (ref 0.40–1.20)
GFR: 87.49 mL/min (ref >60.00)
Glucose, Bld: 138 mg/dL — ABNORMAL HIGH (ref 70–99)
Potassium: 4 mEq/L (ref 3.5–5.1)
Sodium: 136 mEq/L (ref 135–145)

## 2019-03-06 LAB — HEMOGLOBIN A1C: Hgb A1c MFr Bld: 6.6 % — ABNORMAL HIGH (ref 4.6–6.5)

## 2019-03-07 ENCOUNTER — Encounter: Payer: Self-pay | Admitting: General Practice

## 2019-03-11 ENCOUNTER — Ambulatory Visit: Payer: Medicare HMO | Admitting: Podiatry

## 2019-04-08 ENCOUNTER — Other Ambulatory Visit: Payer: Self-pay | Admitting: Family Medicine

## 2019-04-10 ENCOUNTER — Other Ambulatory Visit: Payer: Self-pay | Admitting: Family Medicine

## 2019-04-18 DIAGNOSIS — M1711 Unilateral primary osteoarthritis, right knee: Secondary | ICD-10-CM | POA: Diagnosis not present

## 2019-04-29 DIAGNOSIS — M1711 Unilateral primary osteoarthritis, right knee: Secondary | ICD-10-CM | POA: Diagnosis not present

## 2019-05-06 DIAGNOSIS — M1711 Unilateral primary osteoarthritis, right knee: Secondary | ICD-10-CM | POA: Diagnosis not present

## 2019-05-13 DIAGNOSIS — M1711 Unilateral primary osteoarthritis, right knee: Secondary | ICD-10-CM | POA: Diagnosis not present

## 2019-05-20 DIAGNOSIS — M1711 Unilateral primary osteoarthritis, right knee: Secondary | ICD-10-CM | POA: Diagnosis not present

## 2019-07-12 ENCOUNTER — Ambulatory Visit: Payer: Medicare HMO | Admitting: Family Medicine

## 2019-07-16 ENCOUNTER — Encounter: Payer: Self-pay | Admitting: Family Medicine

## 2019-07-16 ENCOUNTER — Other Ambulatory Visit: Payer: Self-pay

## 2019-07-16 ENCOUNTER — Ambulatory Visit (INDEPENDENT_AMBULATORY_CARE_PROVIDER_SITE_OTHER): Payer: Medicare HMO | Admitting: Family Medicine

## 2019-07-16 VITALS — BP 128/72 | HR 77 | Temp 97.9°F | Resp 17 | Ht 65.0 in | Wt 134.0 lb

## 2019-07-16 DIAGNOSIS — E119 Type 2 diabetes mellitus without complications: Secondary | ICD-10-CM

## 2019-07-16 DIAGNOSIS — Z0000 Encounter for general adult medical examination without abnormal findings: Secondary | ICD-10-CM

## 2019-07-16 DIAGNOSIS — Z23 Encounter for immunization: Secondary | ICD-10-CM

## 2019-07-16 LAB — CBC WITH DIFFERENTIAL/PLATELET
Basophils Absolute: 0.1 10*3/uL (ref 0.0–0.1)
Basophils Relative: 0.6 % (ref 0.0–3.0)
Eosinophils Absolute: 0.3 10*3/uL (ref 0.0–0.7)
Eosinophils Relative: 4.3 % (ref 0.0–5.0)
HCT: 39.4 % (ref 36.0–46.0)
Hemoglobin: 13 g/dL (ref 12.0–15.0)
Lymphocytes Relative: 23 % (ref 12.0–46.0)
Lymphs Abs: 1.8 10*3/uL (ref 0.7–4.0)
MCHC: 33 g/dL (ref 30.0–36.0)
MCV: 90.1 fl (ref 78.0–100.0)
Monocytes Absolute: 0.7 10*3/uL (ref 0.1–1.0)
Monocytes Relative: 8.4 % (ref 3.0–12.0)
Neutro Abs: 5 10*3/uL (ref 1.4–7.7)
Neutrophils Relative %: 63.7 % (ref 43.0–77.0)
Platelets: 295 10*3/uL (ref 150.0–400.0)
RBC: 4.37 Mil/uL (ref 3.87–5.11)
RDW: 12.6 % (ref 11.5–15.5)
WBC: 7.9 10*3/uL (ref 4.0–10.5)

## 2019-07-16 LAB — LIPID PANEL
Cholesterol: 200 mg/dL (ref 0–200)
HDL: 83 mg/dL (ref >39.00)
LDL Cholesterol: 100 mg/dL — ABNORMAL HIGH (ref 0–99)
NonHDL: 117.04
Total CHOL/HDL Ratio: 2
Triglycerides: 85 mg/dL (ref 0.0–149.0)
VLDL: 17 mg/dL (ref 0.0–40.0)

## 2019-07-16 LAB — HEPATIC FUNCTION PANEL
ALT: 14 U/L (ref 0–35)
AST: 15 U/L (ref 0–37)
Albumin: 4.3 g/dL (ref 3.5–5.2)
Alkaline Phosphatase: 66 U/L (ref 39–117)
Bilirubin, Direct: 0.1 mg/dL (ref 0.0–0.3)
Total Bilirubin: 0.4 mg/dL (ref 0.2–1.2)
Total Protein: 6.3 g/dL (ref 6.0–8.3)

## 2019-07-16 LAB — TSH: TSH: 1.04 u[IU]/mL (ref 0.35–4.50)

## 2019-07-16 LAB — BASIC METABOLIC PANEL
BUN: 19 mg/dL (ref 6–23)
CO2: 28 mEq/L (ref 19–32)
Calcium: 9.7 mg/dL (ref 8.4–10.5)
Chloride: 101 mEq/L (ref 96–112)
Creatinine, Ser: 0.61 mg/dL (ref 0.40–1.20)
GFR: 97.39 mL/min (ref >60.00)
Glucose, Bld: 100 mg/dL — ABNORMAL HIGH (ref 70–99)
Potassium: 4.5 mEq/L (ref 3.5–5.1)
Sodium: 137 mEq/L (ref 135–145)

## 2019-07-16 LAB — HEMOGLOBIN A1C: Hgb A1c MFr Bld: 6.7 % — ABNORMAL HIGH (ref 4.6–6.5)

## 2019-07-16 NOTE — Progress Notes (Signed)
   Subjective:    Patient ID: Ashley Shaffer, female    DOB: 01-18-1951, 68 y.o.   MRN: [id number]  HPI CPE- UTD on colonoscopy, mammo, eye exam, microalbumin.  Due for foot exam.   Review of Systems Patient reports no vision/ hearing changes, adenopathy,fever, weight change,  persistant/recurrent hoarseness , swallowing issues, chest pain, palpitations, edema, persistant/recurrent cough, hemoptysis, dyspnea (rest/exertional/paroxysmal nocturnal), gastrointestinal bleeding (melena, rectal bleeding), abdominal pain, significant heartburn, bowel changes, GU symptoms (dysuria, hematuria, incontinence), Gyn symptoms (abnormal  bleeding, pain),  syncope, focal weakness, memory loss, numbness & tingling, skin/hair/nail changes, abnormal bruising or bleeding, anxiety, or depression.     Objective:   Physical Exam General Appearance:    Alert, cooperative, no distress, appears stated age  Head:    Normocephalic, without obvious abnormality, atraumatic  Eyes:    PERRL, conjunctiva/corneas clear, EOM's intact, fundi    benign, both eyes  Ears:    Normal TM's and external ear canals, both ears  Nose:   Deferred due to COVID  Throat:   Neck:   Supple, symmetrical, trachea midline, no adenopathy;    Thyroid: no enlargement/tenderness/nodules  Back:     Symmetric, no curvature, ROM normal, no CVA tenderness  Lungs:     Clear to auscultation bilaterally, respirations unlabored  Chest Wall:    No tenderness or deformity   Heart:    Regular rate and rhythm, S1 and S2 normal, no murmur, rub   or gallop  Breast Exam:    Deferred to mammo  Abdomen:     Soft, non-tender, bowel sounds active all four quadrants,    no masses, no organomegaly  Genitalia:    Deferred  Rectal:    Extremities:   Extremities normal, atraumatic, no cyanosis or edema  Pulses:   2+ and symmetric all extremities  Skin:   Skin color, texture, turgor normal, no rashes or lesions  Lymph nodes:   Cervical, supraclavicular, and  axillary nodes normal  Neurologic:   CNII-XII intact, normal strength, sensation and reflexes    throughout          Assessment & Plan:

## 2019-07-16 NOTE — Assessment & Plan Note (Addendum)
Pt's PE WNL.  UTD on colonoscopy, mammo, eye exam, microalbumin.  Check labs.  Flu shot given.  Anticipatory guidance provided.

## 2019-07-16 NOTE — Assessment & Plan Note (Signed)
Chronic problem.  Hx of good control.  Foot exam done today.  UTD on eye exam and microalbumin.  Check labs.  Adjust meds prn

## 2019-07-16 NOTE — Patient Instructions (Signed)
Follow up in 6 months to recheck diabetes, cholesterol, and BP We'll notify you of your lab results and make any changes if needed Keep up the good work!  You look great! Call with any questions Stay Safe!  Stay Sane!

## 2019-07-17 ENCOUNTER — Encounter: Payer: Self-pay | Admitting: General Practice

## 2019-09-04 ENCOUNTER — Other Ambulatory Visit: Payer: Self-pay | Admitting: Family Medicine

## 2019-09-06 ENCOUNTER — Other Ambulatory Visit: Payer: Self-pay | Admitting: Family Medicine

## 2019-11-04 ENCOUNTER — Other Ambulatory Visit: Payer: Self-pay | Admitting: Family Medicine

## 2019-11-04 DIAGNOSIS — Z1231 Encounter for screening mammogram for malignant neoplasm of breast: Secondary | ICD-10-CM

## 2019-11-05 DIAGNOSIS — R5383 Other fatigue: Secondary | ICD-10-CM | POA: Diagnosis not present

## 2019-11-05 DIAGNOSIS — E559 Vitamin D deficiency, unspecified: Secondary | ICD-10-CM | POA: Diagnosis not present

## 2019-11-05 DIAGNOSIS — M25561 Pain in right knee: Secondary | ICD-10-CM | POA: Diagnosis not present

## 2019-11-07 ENCOUNTER — Other Ambulatory Visit: Payer: Self-pay | Admitting: Family Medicine

## 2019-11-14 ENCOUNTER — Emergency Department (HOSPITAL_COMMUNITY)
Admission: EM | Admit: 2019-11-14 | Discharge: 2019-11-15 | Disposition: A | Discharge status: Home / Self Care | Payer: Medicare HMO | Attending: Emergency Medicine | Admitting: Emergency Medicine

## 2019-11-14 ENCOUNTER — Encounter (HOSPITAL_COMMUNITY): Payer: Self-pay | Admitting: Emergency Medicine

## 2019-11-14 ENCOUNTER — Other Ambulatory Visit: Payer: Self-pay

## 2019-11-14 DIAGNOSIS — R1084 Generalized abdominal pain: Secondary | ICD-10-CM

## 2019-11-14 DIAGNOSIS — Z7984 Long term (current) use of oral hypoglycemic drugs: Secondary | ICD-10-CM | POA: Insufficient documentation

## 2019-11-14 DIAGNOSIS — E119 Type 2 diabetes mellitus without complications: Secondary | ICD-10-CM | POA: Insufficient documentation

## 2019-11-14 DIAGNOSIS — R109 Unspecified abdominal pain: Secondary | ICD-10-CM | POA: Diagnosis present

## 2019-11-14 DIAGNOSIS — E039 Hypothyroidism, unspecified: Secondary | ICD-10-CM | POA: Insufficient documentation

## 2019-11-14 DIAGNOSIS — K5900 Constipation, unspecified: Secondary | ICD-10-CM

## 2019-11-14 DIAGNOSIS — R1110 Vomiting, unspecified: Secondary | ICD-10-CM | POA: Diagnosis not present

## 2019-11-14 DIAGNOSIS — Z79899 Other long term (current) drug therapy: Secondary | ICD-10-CM | POA: Diagnosis not present

## 2019-11-14 DIAGNOSIS — Z85828 Personal history of other malignant neoplasm of skin: Secondary | ICD-10-CM | POA: Insufficient documentation

## 2019-11-14 LAB — CBC
HCT: 39.2 % (ref 36.0–46.0)
Hemoglobin: 13 g/dL (ref 12.0–15.0)
MCH: 29.7 pg (ref 26.0–34.0)
MCHC: 33.2 g/dL (ref 30.0–36.0)
MCV: 89.5 fL (ref 80.0–100.0)
Platelets: 295 10*3/uL (ref 150–400)
RBC: 4.38 MIL/uL (ref 3.87–5.11)
RDW: 11.7 % (ref 11.5–15.5)
WBC: 10.1 10*3/uL (ref 4.0–10.5)
nRBC: 0 % (ref 0.0–0.2)

## 2019-11-14 MED ORDER — ONDANSETRON 4 MG PO TBDP
4.0000 mg | ORAL_TABLET | Freq: Once | ORAL | Status: AC | PRN
  Administered 2019-11-14: 4 mg via ORAL
  Filled 2019-11-14: qty 1

## 2019-11-14 MED ORDER — SODIUM CHLORIDE 0.9% FLUSH
3.0000 mL | Freq: Once | INTRAVENOUS | Status: AC
  Administered 2019-11-15: 3 mL via INTRAVENOUS

## 2019-11-14 NOTE — ED Triage Notes (Signed)
Patient started with sharp abdominal pain since this morning.  Patient states she has been nauseated, no vomiting.  Stools have been soft and small, small amount of urine production due to not being able to eat or drink due to the nausea.  Patient feels bloated and has been belching.

## 2019-11-15 ENCOUNTER — Emergency Department (HOSPITAL_COMMUNITY): Payer: Medicare HMO

## 2019-11-15 ENCOUNTER — Encounter (HOSPITAL_COMMUNITY): Payer: Self-pay | Admitting: Radiology

## 2019-11-15 DIAGNOSIS — R1110 Vomiting, unspecified: Secondary | ICD-10-CM | POA: Diagnosis not present

## 2019-11-15 LAB — COMPREHENSIVE METABOLIC PANEL
ALT: 16 U/L (ref 0–44)
AST: 18 U/L (ref 15–41)
Albumin: 4 g/dL (ref 3.5–5.0)
Alkaline Phosphatase: 61 U/L (ref 38–126)
Anion gap: 11 (ref 5–15)
BUN: 9 mg/dL (ref 8–23)
CO2: 24 mmol/L (ref 22–32)
Calcium: 9.4 mg/dL (ref 8.9–10.3)
Chloride: 97 mmol/L — ABNORMAL LOW (ref 98–111)
Creatinine, Ser: 0.62 mg/dL (ref 0.44–1.00)
GFR calc Af Amer: >60 mL/min (ref >60)
GFR calc non Af Amer: >60 mL/min (ref >60)
Glucose, Bld: 165 mg/dL — ABNORMAL HIGH (ref 70–99)
Potassium: 4 mmol/L (ref 3.5–5.1)
Sodium: 132 mmol/L — ABNORMAL LOW (ref 135–145)
Total Bilirubin: 1 mg/dL (ref 0.3–1.2)
Total Protein: 6.6 g/dL (ref 6.5–8.1)

## 2019-11-15 LAB — URINALYSIS, ROUTINE W REFLEX MICROSCOPIC
Bacteria, UA: NONE SEEN
Bilirubin Urine: NEGATIVE
Glucose, UA: NEGATIVE mg/dL
Hgb urine dipstick: NEGATIVE
Ketones, ur: 20 mg/dL — AB
Leukocytes,Ua: NEGATIVE
Nitrite: NEGATIVE
Protein, ur: 30 mg/dL — AB
Specific Gravity, Urine: 1.018 (ref 1.005–1.030)
pH: 6 (ref 5.0–8.0)

## 2019-11-15 LAB — LIPASE, BLOOD: Lipase: 19 U/L (ref 11–51)

## 2019-11-15 MED ORDER — HYOSCYAMINE SULFATE 0.125 MG PO TABS: 0.2500 mg | ORAL_TABLET | Freq: Once | ORAL | Status: DC

## 2019-11-15 MED ORDER — SODIUM CHLORIDE 0.9 % IV BOLUS
500.0000 mL | Freq: Once | INTRAVENOUS | Status: AC
  Administered 2019-11-15: 500 mL via INTRAVENOUS

## 2019-11-15 MED ORDER — POLYETHYLENE GLYCOL 3350 17 G PO PACK: 17.0000 g | PACK | Freq: Every day | ORAL | 0 refills | Status: DC | PRN

## 2019-11-15 MED ORDER — ONDANSETRON 4 MG PO TBDP
8.0000 mg | ORAL_TABLET | Freq: Once | ORAL | Status: AC
  Administered 2019-11-15: 8 mg via ORAL
  Filled 2019-11-15: qty 2

## 2019-11-15 MED ORDER — ONDANSETRON HCL 4 MG PO TABS: 4.0000 mg | ORAL_TABLET | Freq: Four times a day (QID) | ORAL | 0 refills | Status: DC | PRN

## 2019-11-15 MED ORDER — HYOSCYAMINE SULFATE 0.125 MG SL SUBL: 0.1250 mg | SUBLINGUAL_TABLET | SUBLINGUAL | 0 refills | Status: DC | PRN

## 2019-11-15 MED ORDER — IOHEXOL 300 MG/ML  SOLN
100.0000 mL | Freq: Once | INTRAMUSCULAR | Status: AC | PRN
  Administered 2019-11-15: 100 mL via INTRAVENOUS

## 2019-11-15 MED ORDER — MAGNESIUM CITRATE PO SOLN: 1.0000 | Freq: Once | ORAL | 0 refills | Status: AC

## 2019-11-15 MED ORDER — HYOSCYAMINE SULFATE 0.125 MG SL SUBL
0.2500 mg | SUBLINGUAL_TABLET | Freq: Once | SUBLINGUAL | Status: AC
  Administered 2019-11-15: 0.25 mg via SUBLINGUAL
  Filled 2019-11-15: qty 2

## 2019-11-15 NOTE — ED Notes (Signed)
Patient verbalizes understanding of discharge instructions. Opportunity for questioning and answers were provided. Armband removed by staff, pt discharged from ED ambulatory to home.  

## 2019-11-15 NOTE — ED Provider Notes (Signed)
[facility] EMERGENCY DEPARTMENT Provider Note   CSN: [id number] Arrival date & time: 11/14/19  2256     History Chief Complaint  Patient presents with  . Abdominal Pain    Ashley Shaffer is a 69 y.o. female.  Patient presents to the emergency department for evaluation of abdominal pain.  Symptoms began this morning.  Patient reports that initially she was having right lower abdominal pain, now it is diffuse.  She feels bloated.  She has had nausea and no appetite today.  She has not vomited.  No diarrhea but reports no bowel movements either.  She reports that her symptoms have improved since she was given a Zofran in triage.        Past Medical History:  Diagnosis Date  . Basal cell carcinoma   . Depression   . Diabetes mellitus without complication ([place])   . Hyperlipidemia   . Ischemic colitis ([place])   . Left ACL tear   . Thyroid disease     Patient Active Problem List   Diagnosis Date Noted  . Hyperlipidemia 04/03/2017  . Retinal scar of left eye 06/29/2016  . Physical exam 04/01/2016  . Diabetes mellitus type II, controlled, with no complications ([place]) 48/27/0786  . Hypothyroid 01/27/2016  . Depression 01/27/2016  . Idiopathic scoliosis 01/27/2016    Past Surgical History:  Procedure Laterality Date  . BREAST BIOPSY Left   . BREAST MASS EXCISION Right 1998  . BUNIONECTOMY  1997  . KNEE ARTHROSCOPY  12/04/07  . SHOULDER SURGERY Right 08/10/06  . THYROIDECTOMY  1978   due to enlarged goiter  . TONSILLECTOMY    . TUBAL LIGATION       OB History   No obstetric history on file.     Family History  Problem Relation Age of Onset  . Heart disease Mother   . Healthy Sister     Social History   Tobacco Use  . Smoking status: Never Smoker  . Smokeless tobacco: Never Used  Substance Use Topics  . Alcohol use: Yes  . Drug use: No    Home Medications Prior to Admission medications   Medication Sig Start Date End Date Taking?  Authorizing Provider  ACCU-CHEK SOFTCLIX LANCETS lancets Use one lancet each time sugars are tested. Dx. E11.9 11/14/18   Midge Minium, MD  Blood Glucose Monitoring Suppl (ACCU-CHEK AVIVA PLUS) w/Device KIT Please use new glucometer to check sugar levels daily. Dx E11.9 10/19/18   Midge Minium, MD  buPROPion [facility] SR) 100 MG 12 hr tablet TAKE 1 TABLET TWICE DAILY 09/04/19   Midge Minium, MD  glucose blood (ACCU-CHEK AVIVA PLUS) test strip Please use one strip each time sugars are tested. Pt checks sugars three times daily. Dx E11.9 10/19/18   Midge Minium, MD  hyoscyamine (LEVSIN SL) 0.125 MG SL tablet Place 1 tablet (0.125 mg total) under the tongue every 4 (four) hours as needed for cramping (abdominal pain). 11/15/19   Orpah Greek, MD  levothyroxine (SYNTHROID) 150 MCG tablet TAKE 1 TABLET EVERY DAY BEFORE BREAKFAST 11/07/19   Midge Minium, MD  magnesium citrate SOLN Take 296 mLs (1 Bottle total) by mouth once for 1 dose. 11/15/19 11/15/19  Orpah Greek, MD  metFORMIN (GLUCOPHAGE-XR) 500 MG 24 hr tablet TAKE 2 TABLETS TWICE DAILY 09/06/19   Midge Minium, MD  ONGLYZA 5 MG TABS tablet TAKE 1 TABLET EVERY DAY 11/07/19   Midge Minium, MD  polyethylene glycol (MIRALAX / GLYCOLAX) 17 g packet Take 17 g by mouth daily as needed for moderate constipation. 11/15/19   Orpah Greek, MD    Allergies    Patient has no known allergies.  Review of Systems   Review of Systems  Gastrointestinal: Positive for abdominal distention, abdominal pain and nausea.  All other systems reviewed and are negative.   Physical Exam Updated Vital Signs BP (!) 148/72   Pulse (!) 56   Temp (!) 97.3 F (36.3 C) (Oral)   Resp 18   SpO2 100%   Physical Exam Vitals and nursing note reviewed.  Constitutional:      General: She is not in acute distress.    Appearance: Normal appearance. She is well-developed.  HENT:     Head: Normocephalic and  atraumatic.     Right Ear: Hearing normal.     Left Ear: Hearing normal.     Nose: Nose normal.  Eyes:     Conjunctiva/sclera: Conjunctivae normal.     Pupils: Pupils are equal, round, and reactive to light.  Cardiovascular:     Rate and Rhythm: Regular rhythm.     Heart sounds: S1 normal and S2 normal. No murmur. No friction rub. No gallop.   Pulmonary:     Effort: Pulmonary effort is normal. No respiratory distress.     Breath sounds: Normal breath sounds.  Chest:     Chest wall: No tenderness.  Abdominal:     General: Bowel sounds are normal.     Palpations: Abdomen is soft.     Tenderness: There is generalized abdominal tenderness. There is no guarding or rebound. Negative signs include Murphy's sign and McBurney's sign.     Hernia: No hernia is present.  Musculoskeletal:        General: Normal range of motion.     Cervical back: Normal range of motion and neck supple.  Skin:    General: Skin is warm and dry.     Findings: No rash.  Neurological:     Mental Status: She is alert and oriented to person, place, and time.     GCS: GCS eye subscore is 4. GCS verbal subscore is 5. GCS motor subscore is 6.     Cranial Nerves: No cranial nerve deficit.     Sensory: No sensory deficit.     Coordination: Coordination normal.  Psychiatric:        Speech: Speech normal.        Behavior: Behavior normal.        Thought Content: Thought content normal.     ED Results / Procedures / Treatments   Labs (all labs ordered are listed, but only abnormal results are displayed) Labs Reviewed  COMPREHENSIVE METABOLIC PANEL - Abnormal; Notable for the following components:      Result Value   Sodium 132 (*)    Chloride 97 (*)    Glucose, Bld 165 (*)    All other components within normal limits  URINALYSIS, ROUTINE W REFLEX MICROSCOPIC - Abnormal; Notable for the following components:   Ketones, ur 20 (*)    Protein, ur 30 (*)    All other components within normal limits  LIPASE, BLOOD   CBC    EKG None  Radiology CT ABDOMEN PELVIS W CONTRAST  Result Date: 11/15/2019 CLINICAL DATA:  Abdominal distension, nausea vomiting diarrhea EXAM: CT ABDOMEN AND PELVIS WITH CONTRAST TECHNIQUE: Multidetector CT imaging of the abdomen and pelvis was performed using the standard protocol  following bolus administration of intravenous contrast. CONTRAST:  166m OMNIPAQUE IOHEXOL 300 MG/ML  SOLN COMPARISON:  None. FINDINGS: Lower chest: The visualized heart size within normal limits. No pericardial fluid/thickening. No hiatal hernia. The visualized portions of the lungs are clear. Hepatobiliary: The liver is normal in density without focal abnormality.The main portal vein is patent. No evidence of calcified gallstones, gallbladder wall thickening or biliary dilatation. Pancreas: Unremarkable. No pancreatic ductal dilatation or surrounding inflammatory changes. Spleen: Normal in size without focal abnormality. Adrenals/Urinary Tract: Both adrenal glands appear normal. The kidneys and collecting system appear normal without evidence of urinary tract calculus or hydronephrosis. Bladder is unremarkable. Stomach/Bowel: There is question of mild diffuse wall thickening of the mid body and distal stomach. No significant surrounding mesenteric fat stranding changes however are seen. There is a moderate to large amount of colonic stool. No evidence of bowel obstruction is seen. Scattered colonic diverticula are noted without diverticulitis. Vascular/Lymphatic: There are no enlarged mesenteric, retroperitoneal, or pelvic lymph nodes. Minimal scattered aortic atherosclerosis noted. Reproductive: The uterus and adnexa are unremarkable. Other: No evidence of abdominal wall mass or hernia. Musculoskeletal: No acute or significant osseous findings. S-shaped scoliotic curvature of the thoracolumbar spine. IMPRESSION: Mild wall thickening of the mid to distal stomach which could be due to under distension versus mild  gastritis. Moderate to large amount of colonic stool without evidence of obstruction. Electronically Signed   By: BPrudencio PairM.D.   On: 11/15/2019 02:23    Procedures Procedures (including critical care time)  Medications Ordered in ED Medications  sodium chloride flush (NS) 0.9 % injection 3 mL (3 mLs Intravenous Given 11/15/19 0154)  ondansetron (ZOFRAN-ODT) disintegrating tablet 4 mg (4 mg Oral Given 11/14/19 2337)  sodium chloride 0.9 % bolus 500 mL (500 mLs Intravenous New Bag/Given 11/15/19 0153)  iohexol (OMNIPAQUE) 300 MG/ML solution 100 mL (100 mLs Intravenous Contrast Given 11/15/19 0202)    ED Course  I have reviewed the triage vital signs and the nursing notes.  Pertinent labs & imaging results that were available during my care of the patient were reviewed by me and considered in my medical decision making (see chart for details).    MDM Rules/Calculators/A&P                      Patient presents to the emergency department for evaluation of abdominal pain with abdominal distention.  Symptoms began earlier today and have present through the day.  She reports nausea but no vomiting.  She has had very small hard bowel movements.  Examination reveals diffuse tenderness without guarding or rebound.  No signs of peritonitis.  Lab work was unrevealing so patient underwent CT scan.  This does not show any focal abnormality other than large stool burden consistent with constipation.  As this fits clinically, will treat for constipation, follow-up with PCP and/or GI.  Final Clinical Impression(s) / ED Diagnoses Final diagnoses:  Generalized abdominal pain  Constipation, unspecified constipation type    Rx / DC Orders ED Discharge Orders         Ordered    magnesium citrate SOLN   Once     11/15/19 0244    polyethylene glycol (MIRALAX / GLYCOLAX) 17 g packet  Daily PRN     11/15/19 0244    hyoscyamine (LEVSIN SL) 0.125 MG SL tablet  Every 4 hours PRN     11/15/19 0244            Arian Murley, CHarrell Gave  J, MD 11/15/19 4650

## 2019-11-15 NOTE — ED Notes (Signed)
Pt returned from CT °

## 2019-11-15 NOTE — ED Notes (Signed)
Pt transported to CT ?

## 2019-11-18 ENCOUNTER — Encounter: Payer: Self-pay | Admitting: Physician Assistant

## 2019-11-18 ENCOUNTER — Other Ambulatory Visit: Payer: Self-pay

## 2019-11-18 ENCOUNTER — Ambulatory Visit (INDEPENDENT_AMBULATORY_CARE_PROVIDER_SITE_OTHER): Payer: Medicare HMO | Admitting: Physician Assistant

## 2019-11-18 VITALS — HR 90 | Temp 96.4°F

## 2019-11-18 DIAGNOSIS — K5900 Constipation, unspecified: Secondary | ICD-10-CM | POA: Diagnosis not present

## 2019-11-18 NOTE — Progress Notes (Signed)
I have discussed the procedure for the virtual visit with the patient who has given consent to proceed with assessment and treatment.   Geralyn Figiel N Tashawnda Bleiler, CMA      

## 2019-11-18 NOTE — Progress Notes (Signed)
Virtual Visit via Video   I connected with patient on 11/18/19 at  9:00 AM EST by a video enabled telemedicine application and verified that I am speaking with the correct person using two identifiers.  Location patient: Home Location provider: [facility], Office Persons participating in the virtual visit: Patient, Provider, [place] Denita Lung)  I discussed the limitations of evaluation and management by telemedicine and the availability of in person appointments. The patient expressed understanding and agreed to proceed.  Subjective:   HPI:        Patient presents via doxy doxy.me today for ER follow-up.  Patient presented to ER on 11/14/2019 with complaints of abdominal and lower back pain.  ER work-up included labs and CT scan unremarkable except for significant constipation.  Patient was given Rx MiraLAX, Zofran and hyoscyamine.  Was instructed to follow-up with PCP and gastroenterology if symptoms were not resolving.      Since discharge, patient notes she is still having issue with abdominal bloating and discomfort, low back pain and decreased bowel output.  Endorses she keeps well-hydrated and keeps good fiber in her diet.  Denies recent change to diet or activity level.  Has history of hypothyroidism, endorsing taking her levothyroxine 150 mcg daily as directed.  Has been on this dose for many years.  In regards to medications given in the ER, she does use MiraLAX once and the hyoscyamine only a couple of times.  Does note significant improvement when she uses the hyoscyamine.  Denies any new or worsening symptoms.  Thought the lower back pain may be related to inflammation in her psoas muscle as she has had this problem before.  Took a couple of tizanidine but did not notice significant improvement..  ROS:   See pertinent positives and negatives per HPI.  Patient Active Problem List   Diagnosis Date Noted  . Hyperlipidemia 04/03/2017  . Retinal scar of left eye  06/29/2016  . Physical exam 04/01/2016  . Diabetes mellitus type II, controlled, with no complications ([place]) 14/97/0263  . Hypothyroid 01/27/2016  . Depression 01/27/2016  . Idiopathic scoliosis 01/27/2016    Social History   Tobacco Use  . Smoking status: Never Smoker  . Smokeless tobacco: Never Used  Substance Use Topics  . Alcohol use: Yes    Current Outpatient Medications:  .  ACCU-CHEK SOFTCLIX LANCETS lancets, Use one lancet each time sugars are tested. Dx. E11.9, Disp: 300 each, Rfl: 3 .  Blood Glucose Monitoring Suppl (ACCU-CHEK AVIVA PLUS) w/Device KIT, Please use new glucometer to check sugar levels daily. Dx E11.9, Disp: 1 kit, Rfl: 2 .  buPROPion (WELLBUTRIN SR) 100 MG 12 hr tablet, TAKE 1 TABLET TWICE DAILY, Disp: 180 tablet, Rfl: 1 .  glucose blood (ACCU-CHEK AVIVA PLUS) test strip, Please use one strip each time sugars are tested. Pt checks sugars three times daily. Dx E11.9, Disp: 300 each, Rfl: 3 .  hyoscyamine (LEVSIN SL) 0.125 MG SL tablet, Place 1 tablet (0.125 mg total) under the tongue every 4 (four) hours as needed for cramping (abdominal pain)., Disp: 15 tablet, Rfl: 0 .  levothyroxine (SYNTHROID) 150 MCG tablet, TAKE 1 TABLET EVERY DAY BEFORE BREAKFAST, Disp: 90 tablet, Rfl: 1 .  metFORMIN (GLUCOPHAGE-XR) 500 MG 24 hr tablet, TAKE 2 TABLETS TWICE DAILY, Disp: 360 tablet, Rfl: 1 .  ondansetron (ZOFRAN) 4 MG tablet, Take 1 tablet (4 mg total) by mouth every 6 (six) hours as needed for nausea or vomiting., Disp: 20 tablet, Rfl: 0 .  ONGLYZA 5 MG TABS tablet, TAKE 1 TABLET EVERY DAY, Disp: 90 tablet, Rfl: 1 .  polyethylene glycol (MIRALAX / GLYCOLAX) 17 g packet, Take 17 g by mouth daily as needed for moderate constipation., Disp: 14 each, Rfl: 0  No Known Allergies  Objective:   There were no vitals taken for this visit.  Patient is well-developed, well-nourished in no acute distress.  Resting comfortably at home.  Head is normocephalic, atraumatic.  No  labored breathing.  Speech is clear and coherent with logical content.  Patient is alert and oriented at baseline.   Assessment and Plan:   1. Constipation, unspecified constipation type Causing abdominal discomfort, low back pain and mild abdominal bloating.  Is passing flatus without issue.  Denies significant nausea.  Denies emesis.  Denies melena, hematochezia or tenesmus.  Stool output is reduced but she does not feel that stool is significantly hard or hard to pass.  Is drinking plenty of fluids.  Thankfully no new symptoms.  No alarm signs or symptoms.  We will have her increase MiraLAX to twice daily today then daily over the next week.  Take hyoscyamine as directed.  Start daily probiotic.  Home bowel regimen also reviewed.  We will follow-up with her Wednesday morning via phone to see how she is feeling.  If not improving/resolving she will need an office assessment and recheck of thyroid function.      Leeanne Rio, PA-C 11/18/2019

## 2019-11-18 NOTE — Patient Instructions (Signed)
Instructions sent to MyChart.  Take the MiraLAX twice daily today, once this morning and once this evening. Then resume once daily MiraLAX over the next week. I encourage you to increase hydration and the amount of fiber in your diet.   Start a daily probiotic (Align, Culturelle, Digestive Advantage, etc.). Drink a 4 oz glass of warmed prune juice every 2-3 days to help promote bowel movement. If no results within 24 hours, then repeat above regimen, adding a Dulcolax stool softener to regimen.  If this does not promote a bowel movement, please call the office.  Continue the Hyoscyamine as directed until I give you a call Wednesday.   If anything acutely worsens, please come see [place] or be seen at closest Urgent Care or ER.

## 2019-11-20 ENCOUNTER — Telehealth: Payer: Self-pay | Admitting: Family Medicine

## 2019-11-20 ENCOUNTER — Other Ambulatory Visit: Payer: Self-pay

## 2019-11-20 ENCOUNTER — Ambulatory Visit (INDEPENDENT_AMBULATORY_CARE_PROVIDER_SITE_OTHER): Payer: Medicare HMO

## 2019-11-20 ENCOUNTER — Other Ambulatory Visit: Payer: Self-pay | Admitting: Emergency Medicine

## 2019-11-20 DIAGNOSIS — K5900 Constipation, unspecified: Secondary | ICD-10-CM

## 2019-11-20 DIAGNOSIS — E039 Hypothyroidism, unspecified: Secondary | ICD-10-CM

## 2019-11-20 LAB — CBC WITH DIFFERENTIAL/PLATELET
Basophils Absolute: 0.1 10*3/uL (ref 0.0–0.1)
Basophils Relative: 0.4 % (ref 0.0–3.0)
Eosinophils Absolute: 0.2 10*3/uL (ref 0.0–0.7)
Eosinophils Relative: 1.2 % (ref 0.0–5.0)
HCT: 39 % (ref 36.0–46.0)
Hemoglobin: 13 g/dL (ref 12.0–15.0)
Lymphocytes Relative: 11.1 % — ABNORMAL LOW (ref 12.0–46.0)
Lymphs Abs: 1.5 10*3/uL (ref 0.7–4.0)
MCHC: 33.3 g/dL (ref 30.0–36.0)
MCV: 89.1 fl (ref 78.0–100.0)
Monocytes Absolute: 1.7 10*3/uL — ABNORMAL HIGH (ref 0.1–1.0)
Monocytes Relative: 12.3 % — ABNORMAL HIGH (ref 3.0–12.0)
Neutro Abs: 10.5 10*3/uL — ABNORMAL HIGH (ref 1.4–7.7)
Neutrophils Relative %: 75 % (ref 43.0–77.0)
Platelets: 310 10*3/uL (ref 150.0–400.0)
RBC: 4.37 Mil/uL (ref 3.87–5.11)
RDW: 12.4 % (ref 11.5–15.5)
WBC: 14 10*3/uL — ABNORMAL HIGH (ref 4.0–10.5)

## 2019-11-20 LAB — TSH: TSH: 1.92 u[IU]/mL (ref 0.35–4.50)

## 2019-11-20 LAB — COMPREHENSIVE METABOLIC PANEL
ALT: 11 U/L (ref 0–35)
AST: 12 U/L (ref 0–37)
Albumin: 4.4 g/dL (ref 3.5–5.2)
Alkaline Phosphatase: 67 U/L (ref 39–117)
BUN: 12 mg/dL (ref 6–23)
CO2: 26 mEq/L (ref 19–32)
Calcium: 9.4 mg/dL (ref 8.4–10.5)
Chloride: 95 mEq/L — ABNORMAL LOW (ref 96–112)
Creatinine, Ser: 0.67 mg/dL (ref 0.40–1.20)
GFR: 87.3 mL/min (ref >60.00)
Glucose, Bld: 150 mg/dL — ABNORMAL HIGH (ref 70–99)
Potassium: 3.8 mEq/L (ref 3.5–5.1)
Sodium: 129 mEq/L — ABNORMAL LOW (ref 135–145)
Total Bilirubin: 0.5 mg/dL (ref 0.2–1.2)
Total Protein: 6.5 g/dL (ref 6.0–8.3)

## 2019-11-20 NOTE — Telephone Encounter (Signed)
Since patient symptoms are not improving what are the next steps?

## 2019-11-20 NOTE — Telephone Encounter (Signed)
How are bowel movements going? Are they more full evacuations? Any new symptoms?

## 2019-11-20 NOTE — Telephone Encounter (Signed)
Spoke with patient of Ashley Shaffer options.  She is agreeable with getting lab work. Orders placed in chart and will have provider to palpate abdomen while she is here.

## 2019-11-20 NOTE — Telephone Encounter (Signed)
Patient states she is having bowel movements. 3 this morning but mostly liquid. Not having a lot of stool just liquid Still having bloating She is following the bowel regimen: Miralax, Probiotic, Ducolax and Hycoscamine for the pain. No new symptoms same symptoms.

## 2019-11-20 NOTE — Telephone Encounter (Signed)
I would recommend GI assessment. We can reconsider x-ray of her abdomen to reassess current stool burden but try to avoid imaging when possible. I would like to get her in for check of CBC, CMP, TSH -- can be lab-only appointment. If anything worsens before labs and GI assessment will need to see her in-office. ER for severe symptoms.

## 2019-11-20 NOTE — Telephone Encounter (Signed)
LMOVM of Cody recommendations.

## 2019-11-20 NOTE — Telephone Encounter (Signed)
Pt had VV with Cody on 1/18, was advised to call with update. As of today she is a little better, pain level was 8 and is now 5-6. Unsure what her improvement expectations were and if Einar Pheasant wanted her to come in for lab work.  Pt [phone]

## 2019-11-21 ENCOUNTER — Ambulatory Visit: Payer: Medicare HMO | Attending: Internal Medicine

## 2019-11-21 ENCOUNTER — Telehealth: Payer: Self-pay | Admitting: *Deleted

## 2019-11-21 DIAGNOSIS — Z23 Encounter for immunization: Secondary | ICD-10-CM | POA: Insufficient documentation

## 2019-11-21 MED ORDER — TIZANIDINE HCL 4 MG PO TABS: 4.0000 mg | ORAL_TABLET | Freq: Three times a day (TID) | ORAL | 0 refills | Status: DC | PRN

## 2019-11-21 NOTE — Telephone Encounter (Signed)
Please advise. It does not have your name on the tizanidine refill.

## 2019-11-21 NOTE — Telephone Encounter (Signed)
Pt requesting Refill Muscle relaxer

## 2019-11-21 NOTE — Progress Notes (Signed)
   Covid-19 Vaccination Clinic  Name:  Ashley Shaffer    MRN: [id number] DOB: 1951/09/22  11/21/2019  [name] was observed post Covid-19 immunization for 15 minutes without incidence. She was provided with Vaccine Information Sheet and instruction to access the V-Safe system.   [name] was instructed to call 911 with any severe reactions post vaccine: Marland Kitchen Difficulty breathing  . Swelling of your face and throat  . A fast heartbeat  . A bad rash all over your body  . Dizziness and weakness    Immunizations Administered    Name Date Dose VIS Date Route   Pfizer COVID-19 Vaccine 11/21/2019  8:33 AM 0.3 mL 10/11/2019 Intramuscular   Manufacturer: [place]   Lot: [id number]   [place]: [id number]

## 2019-11-21 NOTE — Telephone Encounter (Signed)
Medication filled to pharmacy as requested.   

## 2019-11-21 NOTE — Telephone Encounter (Signed)
Ok to refill tizanidine 4mg  TID PRN.  #60, no refills

## 2019-11-22 ENCOUNTER — Other Ambulatory Visit: Payer: Self-pay

## 2019-11-22 ENCOUNTER — Ambulatory Visit (INDEPENDENT_AMBULATORY_CARE_PROVIDER_SITE_OTHER): Payer: Medicare HMO

## 2019-11-22 DIAGNOSIS — E871 Hypo-osmolality and hyponatremia: Secondary | ICD-10-CM

## 2019-11-22 LAB — CBC WITH DIFFERENTIAL/PLATELET
Basophils Absolute: 0.1 10*3/uL (ref 0.0–0.1)
Basophils Relative: 0.6 % (ref 0.0–3.0)
Eosinophils Absolute: 0.2 10*3/uL (ref 0.0–0.7)
Eosinophils Relative: 2.2 % (ref 0.0–5.0)
HCT: 37.5 % (ref 36.0–46.0)
Hemoglobin: 12.6 g/dL (ref 12.0–15.0)
Lymphocytes Relative: 18.5 % (ref 12.0–46.0)
Lymphs Abs: 1.8 10*3/uL (ref 0.7–4.0)
MCHC: 33.7 g/dL (ref 30.0–36.0)
MCV: 88.7 fl (ref 78.0–100.0)
Monocytes Absolute: 1.2 10*3/uL — ABNORMAL HIGH (ref 0.1–1.0)
Monocytes Relative: 12.2 % — ABNORMAL HIGH (ref 3.0–12.0)
Neutro Abs: 6.3 10*3/uL (ref 1.4–7.7)
Neutrophils Relative %: 66.5 % (ref 43.0–77.0)
Platelets: 304 10*3/uL (ref 150.0–400.0)
RBC: 4.22 Mil/uL (ref 3.87–5.11)
RDW: 12.3 % (ref 11.5–15.5)
WBC: 9.5 10*3/uL (ref 4.0–10.5)

## 2019-12-12 ENCOUNTER — Ambulatory Visit: Payer: Medicare HMO

## 2019-12-12 ENCOUNTER — Ambulatory Visit: Payer: Medicare HMO | Attending: Internal Medicine

## 2019-12-12 DIAGNOSIS — Z23 Encounter for immunization: Secondary | ICD-10-CM | POA: Insufficient documentation

## 2019-12-12 NOTE — Progress Notes (Signed)
   Covid-19 Vaccination Clinic  Name:  Ashley Shaffer    MRN: [id number] DOB: 11-14-1950  12/12/2019  [name] was observed post Covid-19 immunization for 15 minutes without incidence. She was provided with Vaccine Information Sheet and instruction to access the V-Safe system.   [name] was instructed to call 911 with any severe reactions post vaccine: Marland Kitchen Difficulty breathing  . Swelling of your face and throat  . A fast heartbeat  . A bad rash all over your body  . Dizziness and weakness    Immunizations Administered    Name Date Dose VIS Date Route   Pfizer COVID-19 Vaccine 12/12/2019  8:18 AM 0.3 mL 10/11/2019 Intramuscular   Manufacturer: [place]   Lot: [id number]   [place]: [id number]

## 2019-12-17 DIAGNOSIS — M1711 Unilateral primary osteoarthritis, right knee: Secondary | ICD-10-CM | POA: Diagnosis not present

## 2019-12-24 DIAGNOSIS — M1711 Unilateral primary osteoarthritis, right knee: Secondary | ICD-10-CM | POA: Diagnosis not present

## 2019-12-30 ENCOUNTER — Other Ambulatory Visit: Payer: Self-pay

## 2019-12-30 ENCOUNTER — Ambulatory Visit
Admission: RE | Admit: 2019-12-30 | Discharge: 2019-12-30 | Disposition: A | Discharge status: Home / Self Care | Payer: Medicare HMO | Source: Ambulatory Visit | Attending: Family Medicine | Admitting: Family Medicine

## 2019-12-30 DIAGNOSIS — Z1231 Encounter for screening mammogram for malignant neoplasm of breast: Secondary | ICD-10-CM

## 2019-12-31 DIAGNOSIS — M1711 Unilateral primary osteoarthritis, right knee: Secondary | ICD-10-CM | POA: Diagnosis not present

## 2020-01-07 DIAGNOSIS — M1711 Unilateral primary osteoarthritis, right knee: Secondary | ICD-10-CM | POA: Diagnosis not present

## 2020-01-14 ENCOUNTER — Other Ambulatory Visit: Payer: Self-pay

## 2020-01-14 ENCOUNTER — Encounter: Payer: Self-pay | Admitting: Family Medicine

## 2020-01-14 ENCOUNTER — Ambulatory Visit (INDEPENDENT_AMBULATORY_CARE_PROVIDER_SITE_OTHER): Payer: Medicare HMO | Admitting: Family Medicine

## 2020-01-14 VITALS — BP 124/70 | HR 76 | Temp 98.1°F | Resp 16 | Ht 65.0 in | Wt 135.5 lb

## 2020-01-14 DIAGNOSIS — E038 Other specified hypothyroidism: Secondary | ICD-10-CM

## 2020-01-14 DIAGNOSIS — E119 Type 2 diabetes mellitus without complications: Secondary | ICD-10-CM

## 2020-01-14 DIAGNOSIS — E785 Hyperlipidemia, unspecified: Secondary | ICD-10-CM

## 2020-01-14 LAB — HEPATIC FUNCTION PANEL
ALT: 16 U/L (ref 0–35)
AST: 18 U/L (ref 0–37)
Albumin: 4.2 g/dL (ref 3.5–5.2)
Alkaline Phosphatase: 76 U/L (ref 39–117)
Bilirubin, Direct: 0.1 mg/dL (ref 0.0–0.3)
Total Bilirubin: 0.5 mg/dL (ref 0.2–1.2)
Total Protein: 6.8 g/dL (ref 6.0–8.3)

## 2020-01-14 LAB — CBC WITH DIFFERENTIAL/PLATELET
Basophils Absolute: 0.1 10*3/uL (ref 0.0–0.1)
Basophils Relative: 0.7 % (ref 0.0–3.0)
Eosinophils Absolute: 0.3 10*3/uL (ref 0.0–0.7)
Eosinophils Relative: 4.1 % (ref 0.0–5.0)
HCT: 38.9 % (ref 36.0–46.0)
Hemoglobin: 13.1 g/dL (ref 12.0–15.0)
Lymphocytes Relative: 23.7 % (ref 12.0–46.0)
Lymphs Abs: 1.8 10*3/uL (ref 0.7–4.0)
MCHC: 33.6 g/dL (ref 30.0–36.0)
MCV: 89 fl (ref 78.0–100.0)
Monocytes Absolute: 0.8 10*3/uL (ref 0.1–1.0)
Monocytes Relative: 10.8 % (ref 3.0–12.0)
Neutro Abs: 4.5 10*3/uL (ref 1.4–7.7)
Neutrophils Relative %: 60.7 % (ref 43.0–77.0)
Platelets: 321 10*3/uL (ref 150.0–400.0)
RBC: 4.37 Mil/uL (ref 3.87–5.11)
RDW: 12.8 % (ref 11.5–15.5)
WBC: 7.4 10*3/uL (ref 4.0–10.5)

## 2020-01-14 LAB — LIPID PANEL
Cholesterol: 214 mg/dL — ABNORMAL HIGH (ref 0–200)
HDL: 86 mg/dL (ref >39.00)
LDL Cholesterol: 110 mg/dL — ABNORMAL HIGH (ref 0–99)
NonHDL: 127.92
Total CHOL/HDL Ratio: 2
Triglycerides: 91 mg/dL (ref 0.0–149.0)
VLDL: 18.2 mg/dL (ref 0.0–40.0)

## 2020-01-14 LAB — BASIC METABOLIC PANEL
BUN: 18 mg/dL (ref 6–23)
CO2: 27 mEq/L (ref 19–32)
Calcium: 9.8 mg/dL (ref 8.4–10.5)
Chloride: 99 mEq/L (ref 96–112)
Creatinine, Ser: 0.65 mg/dL (ref 0.40–1.20)
GFR: 90.37 mL/min (ref >60.00)
Glucose, Bld: 112 mg/dL — ABNORMAL HIGH (ref 70–99)
Potassium: 4.3 mEq/L (ref 3.5–5.1)
Sodium: 135 mEq/L (ref 135–145)

## 2020-01-14 LAB — HEMOGLOBIN A1C: Hgb A1c MFr Bld: 6.4 % (ref 4.6–6.5)

## 2020-01-14 LAB — TSH: TSH: 1.25 u[IU]/mL (ref 0.35–4.50)

## 2020-01-14 NOTE — Assessment & Plan Note (Signed)
Chronic problem.  Has been able to control w/ diet and exercise.  Check labs and determine if meds are needed

## 2020-01-14 NOTE — Patient Instructions (Signed)
Schedule your Medicare Wellness Visit at your convenience and a physical with me in 6 months We'll notify you of your lab results and make any changes if needed Continue to work on healthy diet and regular exercise- you look great! Call with any questions or concerns Stay Safe!  Stay Health! Have a great trip!

## 2020-01-14 NOTE — Progress Notes (Signed)
   Subjective:    Patient ID: Ashley Shaffer, female    DOB: April 09, 1951, 69 y.o.   MRN: [id number]  HPI DM- chronic problem, on Metformin XR 1000mg  daily and Onglyza 5mg  daily.  Last A1C 6.7.  UTD on microalbumin, foot exam.  Due for eye exam.  Continues to exercise regularly.  No CP, SOB, HAs, visual changes, edema.  Denies symptomatic lows.  No numbness/tingling of hands/feet.  Hyperlipidemia- chronic problem, has been able to control w/ diet and exercise.  Last LDL 100.  Denies abd pain, N/V  Hypothyroid- chronic problem, on Levothyroxine 142mcg daily.  Denies excessive fatigue, changes to skin/hair/nails.  No changes to bowel or bladder habits (had serious bout of constipation but this has resolved).  Pt has had both COVID vaccines  Review of Systems For ROS see HPI   This visit occurred during the SARS-CoV-2 public health emergency.  Safety protocols were in place, including screening questions prior to the visit, additional usage of staff PPE, and extensive cleaning of exam room while observing appropriate contact time as indicated for disinfecting solutions.       Objective:   Physical Exam Vitals reviewed.  Constitutional:      General: She is not in acute distress.    Appearance: Normal appearance. She is well-developed.  HENT:     Head: Normocephalic and atraumatic.  Eyes:     Conjunctiva/sclera: Conjunctivae normal.     Pupils: Pupils are equal, round, and reactive to light.  Neck:     Thyroid: No thyromegaly.  Cardiovascular:     Rate and Rhythm: Normal rate and regular rhythm.     Heart sounds: Normal heart sounds. No murmur.  Pulmonary:     Effort: Pulmonary effort is normal. No respiratory distress.     Breath sounds: Normal breath sounds.  Abdominal:     General: There is no distension.     Palpations: Abdomen is soft.     Tenderness: There is no abdominal tenderness.  Musculoskeletal:     Cervical back: Normal range of motion and neck supple.   Lymphadenopathy:     Cervical: No cervical adenopathy.  Skin:    General: Skin is warm and dry.  Neurological:     Mental Status: She is alert and oriented to person, place, and time.  Psychiatric:        Behavior: Behavior normal.           Assessment & Plan:

## 2020-01-14 NOTE — Assessment & Plan Note (Signed)
Chronic problem.  Currently asymptomatic.  Check labs.  Adjust meds prn  

## 2020-01-14 NOTE — Assessment & Plan Note (Signed)
Chronic problem.  Tolerating Metformin and Ongylza w/o difficulty.  Pt to schedule eye exam at her convenience.  UTD on foot exam and microalbumin.  Check labs.  Adjust meds prn

## 2020-01-15 ENCOUNTER — Encounter: Payer: Self-pay | Admitting: General Practice

## 2020-01-15 ENCOUNTER — Ambulatory Visit: Payer: Medicare HMO

## 2020-02-02 ENCOUNTER — Other Ambulatory Visit: Payer: Self-pay | Admitting: Family Medicine

## 2020-02-04 ENCOUNTER — Telehealth: Payer: Self-pay | Admitting: Family Medicine

## 2020-02-04 DIAGNOSIS — M1712 Unilateral primary osteoarthritis, left knee: Secondary | ICD-10-CM | POA: Diagnosis not present

## 2020-02-04 NOTE — Progress Notes (Signed)
°  Chronic Care Management   Note  02/04/2020 Name: Caylin Remsberg MRN: [id number] DOB: [birth year]  Leoma Blauser is a 69 y.o. year old female who is a primary care patient of Birdie Riddle, Aundra Millet, MD. I reached out to Kenna Gilbert by phone today in response to a referral sent by Ms. Yvetta Coder Hoey's PCP, Midge Minium, MD.   Ms. Midgette was given information about Chronic Care Management services today including:  1. CCM service includes personalized support from designated clinical staff supervised by her physician, including individualized plan of care and coordination with other care providers 2. 24/7 contact phone numbers for assistance for urgent and routine care needs. 3. Service will only be billed when office clinical staff spend 20 minutes or more in a month to coordinate care. 4. Only one practitioner may furnish and bill the service in a calendar month. 5. The patient may stop CCM services at any time (effective at the end of the month) by phone call to the office staff.   Patient did not agree to services and wishes to consider information provided before deciding about enrollment in care management services.   Follow up plan:   Earney [place] [facility] Scheduler

## 2020-02-05 ENCOUNTER — Other Ambulatory Visit: Payer: Self-pay | Admitting: Family Medicine

## 2020-02-06 ENCOUNTER — Encounter: Payer: Self-pay | Admitting: Family Medicine

## 2020-02-06 DIAGNOSIS — H5203 Hypermetropia, bilateral: Secondary | ICD-10-CM | POA: Diagnosis not present

## 2020-02-06 DIAGNOSIS — E119 Type 2 diabetes mellitus without complications: Secondary | ICD-10-CM | POA: Diagnosis not present

## 2020-02-06 LAB — HM DIABETES EYE EXAM

## 2020-02-12 DIAGNOSIS — M1712 Unilateral primary osteoarthritis, left knee: Secondary | ICD-10-CM | POA: Diagnosis not present

## 2020-02-18 DIAGNOSIS — M1712 Unilateral primary osteoarthritis, left knee: Secondary | ICD-10-CM | POA: Diagnosis not present

## 2020-02-25 DIAGNOSIS — M1712 Unilateral primary osteoarthritis, left knee: Secondary | ICD-10-CM | POA: Diagnosis not present

## 2020-03-27 ENCOUNTER — Encounter: Payer: Self-pay | Admitting: Family Medicine

## 2020-03-27 DIAGNOSIS — E2839 Other primary ovarian failure: Secondary | ICD-10-CM

## 2020-04-01 ENCOUNTER — Telehealth: Payer: Self-pay | Admitting: Family Medicine

## 2020-04-01 NOTE — Telephone Encounter (Signed)
Left message for patient to schedule Annual Wellness Visit.  Please schedule with Nurse Health Advisor Martha Stanley, RN at [facility]  

## 2020-04-05 ENCOUNTER — Other Ambulatory Visit: Payer: Self-pay | Admitting: Family Medicine

## 2020-04-07 ENCOUNTER — Encounter: Payer: Self-pay | Admitting: Family Medicine

## 2020-04-08 DIAGNOSIS — M7542 Impingement syndrome of left shoulder: Secondary | ICD-10-CM | POA: Diagnosis not present

## 2020-04-08 DIAGNOSIS — M25512 Pain in left shoulder: Secondary | ICD-10-CM | POA: Diagnosis not present

## 2020-04-09 ENCOUNTER — Encounter: Payer: Self-pay | Admitting: Family Medicine

## 2020-04-09 ENCOUNTER — Encounter: Payer: Self-pay | Admitting: General Practice

## 2020-04-09 ENCOUNTER — Ambulatory Visit
Admission: RE | Admit: 2020-04-09 | Discharge: 2020-04-09 | Disposition: A | Discharge status: Home / Self Care | Payer: Medicare HMO | Source: Ambulatory Visit | Attending: Family Medicine | Admitting: Family Medicine

## 2020-04-09 ENCOUNTER — Other Ambulatory Visit: Payer: Self-pay

## 2020-04-09 DIAGNOSIS — Z780 Asymptomatic menopausal state: Secondary | ICD-10-CM | POA: Diagnosis not present

## 2020-04-09 DIAGNOSIS — E2839 Other primary ovarian failure: Secondary | ICD-10-CM

## 2020-04-09 DIAGNOSIS — M85832 Other specified disorders of bone density and structure, left forearm: Secondary | ICD-10-CM | POA: Diagnosis not present

## 2020-04-13 ENCOUNTER — Telehealth: Payer: Medicare HMO | Admitting: Family Medicine

## 2020-04-15 ENCOUNTER — Other Ambulatory Visit: Payer: Self-pay

## 2020-04-15 ENCOUNTER — Encounter: Payer: Self-pay | Admitting: Family Medicine

## 2020-04-15 ENCOUNTER — Telehealth: Payer: Medicare HMO | Admitting: Family Medicine

## 2020-04-15 ENCOUNTER — Ambulatory Visit (INDEPENDENT_AMBULATORY_CARE_PROVIDER_SITE_OTHER): Payer: Medicare HMO | Admitting: Family Medicine

## 2020-04-15 VITALS — BP 124/84 | HR 68 | Temp 97.9°F | Resp 16 | Ht 65.0 in | Wt 134.0 lb

## 2020-04-15 DIAGNOSIS — N941 Unspecified dyspareunia: Secondary | ICD-10-CM

## 2020-04-15 NOTE — Patient Instructions (Signed)
Follow up as needed or as scheduled We'll call you with your GYN referral so we can get to the bottom of this Continue to use the lubricants as needed A pessary may be an option Call with any questions or concerns Hang in there!!

## 2020-04-15 NOTE — Progress Notes (Signed)
° °  Subjective:    Patient ID: Ashley Shaffer, female    DOB: 1951-07-14, 69 y.o.   MRN: [id number]  HPI Painful intercourse- pt initially thought it was dryness.  Has been using Revaree vaginal suppositories (hyaluronic acid).  The suppositories have improved moisture but 'it feels like it's hitting a wall'.  This has been going on 8 yrs.   Review of Systems For ROS see HPI   This visit occurred during the SARS-CoV-2 public health emergency.  Safety protocols were in place, including screening questions prior to the visit, additional usage of staff PPE, and extensive cleaning of exam room while observing appropriate contact time as indicated for disinfecting solutions.       Objective:   Physical Exam Vitals reviewed.  Constitutional:      General: She is not in acute distress.    Appearance: Normal appearance. She is not ill-appearing.  HENT:     Head: Normocephalic and atraumatic.  Genitourinary:    Comments: Deferred to GYN Neurological:     General: No focal deficit present.     Mental Status: She is alert and oriented to person, place, and time.  Psychiatric:        Mood and Affect: Mood normal.        Behavior: Behavior normal.        Thought Content: Thought content normal.        Judgment: Judgment normal.           Assessment & Plan:  Dyspareunia- new to provider, ongoing for pt.  Given that she's not having trouble w/ friction or dryness and her description of 'hitting a wall' will need GYN referral to assess.  If she is having bladder or uterine prolapse, a pessary may be the solution.  Need to r/o other anatomical issue.  Pt expressed understanding and is in agreement w/ plan.

## 2020-04-20 ENCOUNTER — Ambulatory Visit: Payer: Medicare HMO | Admitting: Obstetrics and Gynecology

## 2020-04-20 ENCOUNTER — Encounter: Payer: Self-pay | Admitting: Obstetrics and Gynecology

## 2020-04-20 ENCOUNTER — Other Ambulatory Visit: Payer: Self-pay

## 2020-04-20 VITALS — BP 136/74 | HR 64 | Temp 97.0°F | Resp 18 | Ht 63.0 in | Wt 133.0 lb

## 2020-04-20 DIAGNOSIS — N952 Postmenopausal atrophic vaginitis: Secondary | ICD-10-CM | POA: Diagnosis not present

## 2020-04-20 DIAGNOSIS — N761 Subacute and chronic vaginitis: Secondary | ICD-10-CM | POA: Diagnosis not present

## 2020-04-20 NOTE — Progress Notes (Signed)
GYNECOLOGY  VISIT   HPI: 69 y.o.   Married  Caucasian  female   G0P0000 with Patient's last menstrual period was 10/31/2004 (approximate).   here for dyspareunia for years.  Patient has been using revaree vaginal moisturizing suppositories without relief.  No vaginal bleeding or discharge.   She did not use HRT.   States she is urinating often.  She feels pockets and wonders if her bladder has shifts.  No loss of urine with cough, laugh or sneeze.  Does pilates.   From [place].  Enjoys horses.   GYNECOLOGIC HISTORY: Patient's last menstrual period was 10/31/2004 (approximate). Contraception:  Tubal Menopausal hormone therapy:  none Last mammogram: 12-30-19 neg/density C/birads1 Last pap smear:  4 years ago in [place]--normal per patient. paps always normal.        OB History    Gravida  0   Para  0   Term  0   Preterm  0   AB  0   Living  0     SAB  0   TAB  0   Ectopic  0   Multiple  0   Live Births  0              Patient Active Problem List   Diagnosis Date Noted  . Hyperlipidemia 04/03/2017  . Retinal scar of left eye 06/29/2016  . Physical exam 04/01/2016  . Diabetes mellitus type II, controlled, with no complications ([place]) 07/68/0881  . Hypothyroid 01/27/2016  . Depression 01/27/2016  . Idiopathic scoliosis 01/27/2016    Past Medical History:  Diagnosis Date  . Basal cell carcinoma   . Depression   . Diabetes mellitus without complication ([place])    type II  . Hyperlipidemia   . Ischemic colitis ([place])   . Left ACL tear   . Osteopenia   . Thyroid disease    thyroid removed age 13 due to benign mass    Past Surgical History:  Procedure Laterality Date  . BREAST BIOPSY Left    Benign  . BREAST MASS EXCISION Right 1998  . BUNIONECTOMY  1997  . KNEE ARTHROSCOPY  12/04/07  . SHOULDER SURGERY Right 08/10/06  . THYROIDECTOMY  1978   due to enlarged goiter  . TONSILLECTOMY    . TUBAL LIGATION      Current Outpatient Medications   Medication Sig Dispense Refill  . ACCU-CHEK SOFTCLIX LANCETS lancets Use one lancet each time sugars are tested. Dx. E11.9 300 each 3  . Blood Glucose Monitoring Suppl (ACCU-CHEK AVIVA PLUS) w/Device KIT Please use new glucometer to check sugar levels daily. Dx E11.9 1 kit 2  . buPROPion (WELLBUTRIN SR) 100 MG 12 hr tablet TAKE 1 TABLET TWICE DAILY 180 tablet 1  . glucose blood (ACCU-CHEK AVIVA PLUS) test strip Please use one strip each time sugars are tested. Pt checks sugars three times daily. Dx E11.9 300 each 3  . levothyroxine (SYNTHROID) 150 MCG tablet TAKE 1 TABLET EVERY DAY BEFORE BREAKFAST 90 tablet 1  . metFORMIN (GLUCOPHAGE-XR) 500 MG 24 hr tablet TAKE 2 TABLETS TWICE DAILY 360 tablet 1  . ONGLYZA 5 MG TABS tablet TAKE 1 TABLET EVERY DAY 90 tablet 1  . [place] 30 MG/2ML SOSY      No current facility-administered medications for this visit.     ALLERGIES: Patient has no known allergies.  Family History  Problem Relation Age of Onset  . Heart disease Mother   . Healthy Sister   . Heart  disease Brother   . Diabetes Maternal Aunt     Social History   Socioeconomic History  . Marital status: Married    Spouse name: Not on file  . Number of children: Not on file  . Years of education: Not on file  . Highest education level: Not on file  Occupational History  . Not on file  Tobacco Use  . Smoking status: Never Smoker  . Smokeless tobacco: Never Used  Vaping Use  . Vaping Use: Never used  Substance and Sexual Activity  . Alcohol use: Yes    Alcohol/week: 2.0 standard drinks    Types: 2 Glasses of wine per week  . Drug use: No  . Sexual activity: Yes    Birth control/protection: Surgical    Comment: tubal  Other Topics Concern  . Not on file  Social History Narrative  . Not on file   Social Determinants of Health   Financial Resource Strain:   . Difficulty of Paying Living Expenses:   Food Insecurity:   . Worried About Charity fundraiser in the Last  Year:   . Arboriculturist in the Last Year:   Transportation Needs:   . Film/video editor (Medical):   Marland Kitchen Lack of Transportation (Non-Medical):   Physical Activity:   . Days of Exercise per Week:   . Minutes of Exercise per Session:   Stress:   . Feeling of Stress :   Social Connections:   . Frequency of Communication with Friends and Family:   . Frequency of Social Gatherings with Friends and Family:   . Attends Religious Services:   . Active Member of Clubs or Organizations:   . Attends Archivist Meetings:   Marland Kitchen Marital Status:   Intimate Partner Violence:   . Fear of Current or Ex-Partner:   . Emotionally Abused:   Marland Kitchen Physically Abused:   . Sexually Abused:     Review of Systems  All other systems reviewed and are negative.   PHYSICAL EXAMINATION:    BP 136/74   Pulse 64   Temp (!) 97 F (36.1 C) (Temporal)   Resp 18   Ht '5\' 3"'  (1.6 m)   Wt 133 lb (60.3 kg)   LMP 10/31/2004 (Approximate)   BMI 23.56 kg/m     General appearance: alert, cooperative and appears stated age Head: Normocephalic, without obvious abnormality, atraumatic Lungs: clear to auscultation bilaterally Heart: regular rate and rhythm Abdomen: soft, non-tender, no masses,  no organomegaly Extremities: extremities normal, atraumatic, no cyanosis or edema No abnormal inguinal nodes palpated Neurologic: Grossly normal  Pelvic: External genitalia:  no lesions              Urethra:  normal appearing urethra with no masses, tenderness or lesions              Bartholins and Skenes: normal                 Vagina: normal appearing vagina with mucosa with generalized erythema and small amount of white discharge, no lesions              Cervix: no lesions                Bimanual Exam:  Uterus:  normal size, contour, position, consistency, mobility, non-tender              Adnexa: no mass, fullness, tenderness           Chaperone  was present for exam.  ASSESSMENT  Vaginitis.   Atrophy.   No pelvic organ prolapse.  Ischemic colitis.  DM.  PLAN  Affirm taken.  We discussed the forms of vaginitis.  Atrophy reviewed - etiology and treatment.  Treatments may include water based lubricants, cooking oils, local vaginal estrogens, oosphena, and [place].  She will try cooling oils and call back if she wishes to try local vaginal estrogen.   I did discuss the potential effect of estrogen on breast cancer.     An After Visit Summary was printed and given to the patient.

## 2020-04-20 NOTE — Patient Instructions (Signed)
Atrophic Vaginitis  Atrophic vaginitis is a condition in which the tissues that line the vagina become dry and thin. This condition is most common in women who have stopped having regular menstrual periods (are in menopause). This usually starts when a woman is 45-69 years old. That is the time when a woman's estrogen levels begin to drop (decrease). Estrogen is a female hormone. It helps to keep the tissues of the vagina moist. It stimulates the vagina to produce a clear fluid that lubricates the vagina for sexual intercourse. This fluid also protects the vagina from infection. Lack of estrogen can cause the lining of the vagina to get thinner and dryer. The vagina may also shrink in size. It may become less elastic. Atrophic vaginitis tends to get worse over time as a woman's estrogen level drops. What are the causes? This condition is caused by the normal drop in estrogen that happens around the time of menopause. What increases the risk? Certain conditions or situations may lower a woman's estrogen level, leading to a higher risk for atrophic vaginitis. You are more likely to develop this condition if:  You are taking medicines that block estrogen.  You have had your ovaries removed.  You are being treated for cancer with X-ray (radiation) or medicines (chemotherapy).  You have given birth or are breastfeeding.  You are older than age 50.  You smoke. What are the signs or symptoms? Symptoms of this condition include:  Pain, soreness, or bleeding during sexual intercourse (dyspareunia).  Vaginal burning, irritation, or itching.  Pain or bleeding when a speculum is used in a vaginal exam (pelvic exam).  Having burning pain when passing urine.  Vaginal discharge that is brown or yellow. In some cases, there are no symptoms. How is this diagnosed? This condition is diagnosed by taking a medical history and doing a physical exam. This will include a pelvic exam that checks the  vaginal tissues. Though rare, you may also have other tests, including:  A urine test.  A test that checks the acid balance in your vagina (acid balance test). How is this treated? Treatment for this condition depends on how severe your symptoms are. Treatment may include:  Using an over-the-counter vaginal lubricant before sex.  Using a long-acting vaginal moisturizer.  Using low-dose vaginal estrogen for moderate to severe symptoms that do not respond to other treatments. Options include creams, tablets, and inserts (vaginal rings). Before you use a vaginal estrogen, tell your health care provider if you have a history of: ? Breast cancer. ? Endometrial cancer. ? Blood clots. If you are not sexually active and your symptoms are very mild, you may not need treatment. Follow these instructions at home: Medicines  Take over-the-counter and prescription medicines only as told by your health care provider. Do not use herbal or alternative medicines unless your health care provider says that you can.  Use over-the-counter creams, lubricants, or moisturizers for dryness only as directed by your health care provider. General instructions  If your atrophic vaginitis is caused by menopause, discuss all of your menopause symptoms and treatment options with your health care provider.  Do not douche.  Do not use products that can make your vagina dry. These include: ? Scented feminine sprays. ? Scented tampons. ? Scented soaps.  Vaginal intercourse can help to improve blood flow and elasticity of vaginal tissue. If it hurts to have sex, try using a lubricant or moisturizer just before having intercourse. Contact a health care provider if:    Your discharge looks different than normal.  Your vagina has an unusual smell.  You have new symptoms.  Your symptoms do not improve with treatment.  Your symptoms get worse. Summary  Atrophic vaginitis is a condition in which the tissues that  line the vagina become dry and thin. It is most common in women who have stopped having regular menstrual periods (are in menopause).  Treatment options include using vaginal lubricants and low-dose vaginal estrogen.  Contact a health care provider if your vagina has an unusual smell, or if your symptoms get worse or do not improve after treatment. This information is not intended to replace advice given to you by your health care provider. Make sure you discuss any questions you have with your health care provider. Document Revised: 09/29/2017 Document Reviewed: 07/13/2017 Elsevier Patient Education  2020 [employer]  

## 2020-04-21 ENCOUNTER — Encounter: Payer: Self-pay | Admitting: Family Medicine

## 2020-04-21 LAB — VAGINITIS/VAGINOSIS, DNA PROBE
Candida Species: NEGATIVE
Gardnerella vaginalis: NEGATIVE
Trichomonas vaginosis: NEGATIVE

## 2020-04-22 DIAGNOSIS — M25512 Pain in left shoulder: Secondary | ICD-10-CM | POA: Diagnosis not present

## 2020-04-24 ENCOUNTER — Telehealth: Payer: Self-pay | Admitting: Obstetrics and Gynecology

## 2020-04-24 MED ORDER — ESTRADIOL 0.1 MG/GM VA CREA
TOPICAL_CREAM | VAGINAL | 1 refills | Status: DC
Start: 2020-04-24 — End: 2021-03-24

## 2020-04-24 NOTE — Telephone Encounter (Signed)
I recommend Estrace cream. Sig:  place 1/2 gram pv at hs for 2 weeks.  Then place 1/2 gram pv at hs two to three times per week.  Disp:  42.5 gram RF:  one.  Please make an appointment for a recheck with me in 6 weeks to see her response to the cream.

## 2020-04-24 NOTE — Telephone Encounter (Signed)
Reviewed OV notes dated 04/20/20, vaginal estrogen reviewed.   Routing to Dr. Quincy Simmonds to review and advise on Rx request.

## 2020-04-24 NOTE — Telephone Encounter (Signed)
Spoke with patient, advised per Dr. Quincy Simmonds. Patient request Rx to [place], [place]. OV scheduled for 8/4 at 4:30pm with Dr. Quincy Simmonds. Questions answered. Patient verbalizes understanding and is agreeable.   Encounter closed.

## 2020-04-24 NOTE — Telephone Encounter (Signed)
Patient saw DR.Quincy Simmonds recently and discuss starting an estrogen cream. She has decided she would like the prescription sen to [facility], [place], [place]. She would like to talk with a nurse abut the directions for use of this cream.

## 2020-05-12 DIAGNOSIS — M25512 Pain in left shoulder: Secondary | ICD-10-CM | POA: Diagnosis not present

## 2020-05-18 ENCOUNTER — Telehealth: Payer: Self-pay | Admitting: Family Medicine

## 2020-05-18 NOTE — Telephone Encounter (Signed)
Left message for patient to schedule Annual Wellness Visit.  Please schedule with Nurse Health Advisor Martha Stanley, RN at [facility]  

## 2020-05-25 ENCOUNTER — Telehealth: Payer: Self-pay

## 2020-05-25 NOTE — Telephone Encounter (Signed)
Patient left message to cancel office visit for 6 week med recheck. Patient stated she would call back to reschedule when ready.

## 2020-05-26 NOTE — Telephone Encounter (Signed)
Encounter reviewed and closed.  

## 2020-06-02 ENCOUNTER — Encounter: Payer: Self-pay | Admitting: Family Medicine

## 2020-06-02 MED ORDER — SAXAGLIPTIN HCL 5 MG PO TABS: 5.0000 mg | ORAL_TABLET | Freq: Every day | ORAL | 6 refills | Status: DC

## 2020-06-03 ENCOUNTER — Ambulatory Visit: Payer: Self-pay | Admitting: Obstetrics and Gynecology

## 2020-06-08 ENCOUNTER — Encounter: Payer: Self-pay | Admitting: Family Medicine

## 2020-06-08 MED ORDER — HYDROCORTISONE ACETATE 25 MG RE SUPP
25.0000 mg | Freq: Two times a day (BID) | RECTAL | 0 refills | Status: DC
Start: 2020-06-08 — End: 2021-03-24

## 2020-06-28 ENCOUNTER — Other Ambulatory Visit: Payer: Self-pay | Admitting: Family Medicine

## 2020-07-01 ENCOUNTER — Other Ambulatory Visit: Payer: Self-pay | Admitting: Family Medicine

## 2020-07-13 ENCOUNTER — Other Ambulatory Visit: Payer: Self-pay

## 2020-07-13 ENCOUNTER — Ambulatory Visit (INDEPENDENT_AMBULATORY_CARE_PROVIDER_SITE_OTHER): Payer: Medicare HMO | Admitting: Family Medicine

## 2020-07-13 ENCOUNTER — Encounter: Payer: Self-pay | Admitting: Family Medicine

## 2020-07-13 VITALS — BP 128/60 | HR 78 | Temp 98.0°F | Resp 16 | Ht 63.0 in | Wt 135.2 lb

## 2020-07-13 DIAGNOSIS — Z23 Encounter for immunization: Secondary | ICD-10-CM

## 2020-07-13 DIAGNOSIS — E119 Type 2 diabetes mellitus without complications: Secondary | ICD-10-CM

## 2020-07-13 DIAGNOSIS — Z0000 Encounter for general adult medical examination without abnormal findings: Secondary | ICD-10-CM

## 2020-07-13 LAB — BASIC METABOLIC PANEL
BUN: 20 mg/dL (ref 6–23)
CO2: 28 mEq/L (ref 19–32)
Calcium: 9.8 mg/dL (ref 8.4–10.5)
Chloride: 96 mEq/L (ref 96–112)
Creatinine, Ser: 0.73 mg/dL (ref 0.40–1.20)
GFR: 78.93 mL/min (ref >60.00)
Glucose, Bld: 108 mg/dL — ABNORMAL HIGH (ref 70–99)
Potassium: 4.2 mEq/L (ref 3.5–5.1)
Sodium: 133 mEq/L — ABNORMAL LOW (ref 135–145)

## 2020-07-13 LAB — LIPID PANEL
Cholesterol: 199 mg/dL (ref 0–200)
HDL: 79.7 mg/dL (ref >39.00)
LDL Cholesterol: 90 mg/dL (ref 0–99)
NonHDL: 119.61
Total CHOL/HDL Ratio: 3
Triglycerides: 150 mg/dL — ABNORMAL HIGH (ref 0.0–149.0)
VLDL: 30 mg/dL (ref 0.0–40.0)

## 2020-07-13 LAB — CBC WITH DIFFERENTIAL/PLATELET
Basophils Absolute: 0 10*3/uL (ref 0.0–0.1)
Basophils Relative: 0.4 % (ref 0.0–3.0)
Eosinophils Absolute: 0.3 10*3/uL (ref 0.0–0.7)
Eosinophils Relative: 2.6 % (ref 0.0–5.0)
HCT: 37.8 % (ref 36.0–46.0)
Hemoglobin: 12.8 g/dL (ref 12.0–15.0)
Lymphocytes Relative: 21.6 % (ref 12.0–46.0)
Lymphs Abs: 2.2 10*3/uL (ref 0.7–4.0)
MCHC: 33.9 g/dL (ref 30.0–36.0)
MCV: 90 fl (ref 78.0–100.0)
Monocytes Absolute: 0.9 10*3/uL (ref 0.1–1.0)
Monocytes Relative: 8.7 % (ref 3.0–12.0)
Neutro Abs: 6.8 10*3/uL (ref 1.4–7.7)
Neutrophils Relative %: 66.7 % (ref 43.0–77.0)
Platelets: 317 10*3/uL (ref 150.0–400.0)
RBC: 4.19 Mil/uL (ref 3.87–5.11)
RDW: 11.9 % (ref 11.5–15.5)
WBC: 10.2 10*3/uL (ref 4.0–10.5)

## 2020-07-13 LAB — HEPATIC FUNCTION PANEL
ALT: 15 U/L (ref 0–35)
AST: 16 U/L (ref 0–37)
Albumin: 4.5 g/dL (ref 3.5–5.2)
Alkaline Phosphatase: 57 U/L (ref 39–117)
Bilirubin, Direct: 0.1 mg/dL (ref 0.0–0.3)
Total Bilirubin: 0.5 mg/dL (ref 0.2–1.2)
Total Protein: 6.6 g/dL (ref 6.0–8.3)

## 2020-07-13 LAB — TSH: TSH: 1.3 u[IU]/mL (ref 0.35–4.50)

## 2020-07-13 LAB — MICROALBUMIN / CREATININE URINE RATIO
Creatinine,U: 29 mg/dL
Microalb Creat Ratio: 2.4 mg/g (ref 0.0–30.0)
Microalb, Ur: <0.7 mg/dL (ref 0.0–1.9)

## 2020-07-13 LAB — HEMOGLOBIN A1C: Hgb A1c MFr Bld: 6.6 % — ABNORMAL HIGH (ref 4.6–6.5)

## 2020-07-13 NOTE — Assessment & Plan Note (Signed)
Chronic problem, on Metformin.  UTD on eye exam.  Foot exam done today.  Check microalbumin.  Check labs.  Adjust meds prn

## 2020-07-13 NOTE — Patient Instructions (Signed)
Follow up in 3-4 months to recheck diabetes We'll notify you of your lab results and make any changes if needed Keep up the good work on healthy diet and regular exercise- you look great! Call with any questions or concerns Stay safe!  Stay Healthy!

## 2020-07-13 NOTE — Assessment & Plan Note (Signed)
Pt's PE WNL.  UTD on mammo, colonoscopy, COVID, pneumonia vaccines.  Due for flu today- given today.  Check labs.  Anticipatory guidance provided.

## 2020-07-13 NOTE — Progress Notes (Signed)
   Subjective:    Patient ID: Ashley Shaffer, female    DOB: 10/24/1951, 69 y.o.   MRN: [id number]  HPI CPE- UTD on colonoscopy, mammogram, eye exam.  Due for foot exam.  UTD on DEXA.  Due for microalbumin.  UTD on pneumonia vaccines, COVID vaccine.  Due for flu.  'feeling great!'  Reviewed past medical, surgical, family and social histories.   Health Maintenance  Topic Date Due  . URINE MICROALBUMIN  03/05/2020  . INFLUENZA VACCINE  05/31/2020  . TETANUS/TDAP  01/13/2021 (Originally 10/14/2017)  . Hepatitis C Screening  01/13/2021 (Originally [birth year])  . FOOT EXAM  07/15/2020  . HEMOGLOBIN A1C  07/16/2020  . OPHTHALMOLOGY EXAM  02/05/2021  . MAMMOGRAM  12/29/2021  . COLONOSCOPY  07/02/2023  . DEXA SCAN  Completed  . COVID-19 Vaccine  Completed  . PNA vac Low Risk Adult  Completed     Review of Systems Patient reports no vision/ hearing changes, adenopathy,fever, weight change,  persistant/recurrent hoarseness , swallowing issues, chest pain, palpitations, edema, persistant/recurrent cough, hemoptysis, dyspnea (rest/exertional/paroxysmal nocturnal), gastrointestinal bleeding (melena, rectal bleeding), abdominal pain, significant heartburn, bowel changes, GU symptoms (dysuria, hematuria, incontinence), Gyn symptoms (abnormal  bleeding, pain),  syncope, focal weakness, memory loss, numbness & tingling, skin/hair/nail changes, abnormal bruising or bleeding, anxiety, or depression.   This visit occurred during the SARS-CoV-2 public health emergency.  Safety protocols were in place, including screening questions prior to the visit, additional usage of staff PPE, and extensive cleaning of exam room while observing appropriate contact time as indicated for disinfecting solutions.       Objective:   Physical Exam General Appearance:    Alert, cooperative, no distress, appears stated age  Head:    Normocephalic, without obvious abnormality, atraumatic  Eyes:    PERRL,  conjunctiva/corneas clear, EOM's intact, fundi    benign, both eyes  Ears:    Normal TM's and external ear canals, both ears  Nose:   Deferred due to COVID  Throat:   Neck:   Supple, symmetrical, trachea midline, no adenopathy;    Thyroid: no enlargement/tenderness/nodules  Back:     Symmetric, no curvature, ROM normal, no CVA tenderness  Lungs:     Clear to auscultation bilaterally, respirations unlabored  Chest Wall:    No tenderness or deformity   Heart:    Regular rate and rhythm, S1 and S2 normal, no murmur, rub   or gallop  Breast Exam:    Deferred to mammo  Abdomen:     Soft, non-tender, bowel sounds active all four quadrants,    no masses, no organomegaly  Genitalia:    Deferred  Rectal:    Extremities:   Extremities normal, atraumatic, no cyanosis or edema  Pulses:   2+ and symmetric all extremities  Skin:   Skin color, texture, turgor normal, no rashes or lesions  Lymph nodes:   Cervical, supraclavicular, and axillary nodes normal  Neurologic:   CNII-XII intact, normal strength, sensation and reflexes    throughout          Assessment & Plan:

## 2020-07-14 ENCOUNTER — Encounter: Payer: Self-pay | Admitting: General Practice

## 2020-07-23 DIAGNOSIS — M19011 Primary osteoarthritis, right shoulder: Secondary | ICD-10-CM | POA: Diagnosis not present

## 2020-08-30 ENCOUNTER — Other Ambulatory Visit: Payer: Self-pay | Admitting: Family Medicine

## 2020-09-22 DIAGNOSIS — M19012 Primary osteoarthritis, left shoulder: Secondary | ICD-10-CM | POA: Diagnosis not present

## 2020-09-22 DIAGNOSIS — S83241A Other tear of medial meniscus, current injury, right knee, initial encounter: Secondary | ICD-10-CM | POA: Diagnosis not present

## 2020-09-29 DIAGNOSIS — M25561 Pain in right knee: Secondary | ICD-10-CM | POA: Diagnosis not present

## 2020-10-04 DIAGNOSIS — S0181XA Laceration without foreign body of other part of head, initial encounter: Secondary | ICD-10-CM | POA: Diagnosis not present

## 2020-10-09 ENCOUNTER — Encounter: Payer: Self-pay | Admitting: Family Medicine

## 2020-10-12 ENCOUNTER — Other Ambulatory Visit: Payer: Self-pay

## 2020-10-12 DIAGNOSIS — S0181XA Laceration without foreign body of other part of head, initial encounter: Secondary | ICD-10-CM

## 2020-11-04 ENCOUNTER — Encounter: Payer: Self-pay | Admitting: Family Medicine

## 2020-11-04 DIAGNOSIS — J019 Acute sinusitis, unspecified: Secondary | ICD-10-CM

## 2020-11-04 DIAGNOSIS — B9689 Other specified bacterial agents as the cause of diseases classified elsewhere: Secondary | ICD-10-CM

## 2020-11-04 MED ORDER — HYDROCOD POLST-CPM POLST ER 10-8 MG/5ML PO SUER: 5.0000 mL | Freq: Two times a day (BID) | ORAL | 0 refills | Status: DC | PRN

## 2020-11-06 ENCOUNTER — Ambulatory Visit: Payer: Medicare HMO | Admitting: Family Medicine

## 2020-11-09 ENCOUNTER — Ambulatory Visit: Payer: Medicare HMO | Admitting: Family Medicine

## 2020-11-09 ENCOUNTER — Other Ambulatory Visit: Payer: Self-pay | Admitting: Family Medicine

## 2020-11-12 ENCOUNTER — Institutional Professional Consult (permissible substitution): Payer: Medicare HMO | Admitting: Plastic Surgery

## 2020-11-12 ENCOUNTER — Other Ambulatory Visit: Payer: Self-pay

## 2020-11-12 ENCOUNTER — Encounter: Payer: Self-pay | Admitting: Family Medicine

## 2020-11-12 MED ORDER — METFORMIN HCL ER 500 MG PO TB24: 1000.0000 mg | ORAL_TABLET | Freq: Two times a day (BID) | ORAL | 1 refills | Status: DC

## 2020-11-23 ENCOUNTER — Encounter: Payer: Self-pay | Admitting: Family Medicine

## 2020-11-24 ENCOUNTER — Encounter: Payer: Self-pay | Admitting: Family Medicine

## 2020-11-24 ENCOUNTER — Other Ambulatory Visit: Payer: Self-pay | Admitting: Emergency Medicine

## 2020-11-24 DIAGNOSIS — L989 Disorder of the skin and subcutaneous tissue, unspecified: Secondary | ICD-10-CM

## 2020-11-25 NOTE — Telephone Encounter (Signed)
Pt has been made aware that the referral has been sent to [facility] dermatology.

## 2020-12-02 ENCOUNTER — Other Ambulatory Visit: Payer: Self-pay | Admitting: Family Medicine

## 2020-12-02 DIAGNOSIS — Z1231 Encounter for screening mammogram for malignant neoplasm of breast: Secondary | ICD-10-CM

## 2020-12-22 DIAGNOSIS — M1711 Unilateral primary osteoarthritis, right knee: Secondary | ICD-10-CM | POA: Diagnosis not present

## 2020-12-22 DIAGNOSIS — M19011 Primary osteoarthritis, right shoulder: Secondary | ICD-10-CM | POA: Diagnosis not present

## 2020-12-24 DIAGNOSIS — L821 Other seborrheic keratosis: Secondary | ICD-10-CM | POA: Diagnosis not present

## 2020-12-24 DIAGNOSIS — I781 Nevus, non-neoplastic: Secondary | ICD-10-CM | POA: Diagnosis not present

## 2020-12-29 DIAGNOSIS — M1711 Unilateral primary osteoarthritis, right knee: Secondary | ICD-10-CM | POA: Diagnosis not present

## 2021-01-01 ENCOUNTER — Inpatient Hospital Stay: Admission: RE | Admit: 2021-01-01 | Payer: Medicare HMO | Source: Ambulatory Visit

## 2021-01-01 DIAGNOSIS — Z1231 Encounter for screening mammogram for malignant neoplasm of breast: Secondary | ICD-10-CM

## 2021-01-05 DIAGNOSIS — M1711 Unilateral primary osteoarthritis, right knee: Secondary | ICD-10-CM | POA: Diagnosis not present

## 2021-01-10 ENCOUNTER — Other Ambulatory Visit: Payer: Self-pay | Admitting: Family Medicine

## 2021-01-12 ENCOUNTER — Ambulatory Visit (INDEPENDENT_AMBULATORY_CARE_PROVIDER_SITE_OTHER): Payer: Medicare HMO | Admitting: Family Medicine

## 2021-01-12 ENCOUNTER — Encounter: Payer: Self-pay | Admitting: Family Medicine

## 2021-01-12 ENCOUNTER — Other Ambulatory Visit: Payer: Self-pay

## 2021-01-12 VITALS — BP 118/70 | HR 68 | Temp 98.1°F | Resp 17 | Ht 63.0 in | Wt 136.2 lb

## 2021-01-12 DIAGNOSIS — M1710 Unilateral primary osteoarthritis, unspecified knee: Secondary | ICD-10-CM | POA: Insufficient documentation

## 2021-01-12 DIAGNOSIS — M1711 Unilateral primary osteoarthritis, right knee: Secondary | ICD-10-CM | POA: Diagnosis not present

## 2021-01-12 DIAGNOSIS — M170 Bilateral primary osteoarthritis of knee: Secondary | ICD-10-CM | POA: Diagnosis not present

## 2021-01-12 DIAGNOSIS — E119 Type 2 diabetes mellitus without complications: Secondary | ICD-10-CM

## 2021-01-12 DIAGNOSIS — E039 Hypothyroidism, unspecified: Secondary | ICD-10-CM

## 2021-01-12 DIAGNOSIS — M179 Osteoarthritis of knee, unspecified: Secondary | ICD-10-CM | POA: Insufficient documentation

## 2021-01-12 LAB — CBC WITH DIFFERENTIAL/PLATELET
Basophils Absolute: 0 10*3/uL (ref 0.0–0.1)
Basophils Relative: 0.5 % (ref 0.0–3.0)
Eosinophils Absolute: 0.1 10*3/uL (ref 0.0–0.7)
Eosinophils Relative: 1.4 % (ref 0.0–5.0)
HCT: 38.9 % (ref 36.0–46.0)
Hemoglobin: 13.3 g/dL (ref 12.0–15.0)
Lymphocytes Relative: 20 % (ref 12.0–46.0)
Lymphs Abs: 1.7 10*3/uL (ref 0.7–4.0)
MCHC: 34.2 g/dL (ref 30.0–36.0)
MCV: 88.8 fl (ref 78.0–100.0)
Monocytes Absolute: 0.7 10*3/uL (ref 0.1–1.0)
Monocytes Relative: 8.6 % (ref 3.0–12.0)
Neutro Abs: 6 10*3/uL (ref 1.4–7.7)
Neutrophils Relative %: 69.5 % (ref 43.0–77.0)
Platelets: 309 10*3/uL (ref 150.0–400.0)
RBC: 4.39 Mil/uL (ref 3.87–5.11)
RDW: 12.6 % (ref 11.5–15.5)
WBC: 8.7 10*3/uL (ref 4.0–10.5)

## 2021-01-12 LAB — HEPATIC FUNCTION PANEL
ALT: 17 U/L (ref 0–35)
AST: 16 U/L (ref 0–37)
Albumin: 4.1 g/dL (ref 3.5–5.2)
Alkaline Phosphatase: 65 U/L (ref 39–117)
Bilirubin, Direct: 0 mg/dL (ref 0.0–0.3)
Total Bilirubin: 0.3 mg/dL (ref 0.2–1.2)
Total Protein: 6.5 g/dL (ref 6.0–8.3)

## 2021-01-12 LAB — TSH: TSH: 1.89 u[IU]/mL (ref 0.35–4.50)

## 2021-01-12 LAB — BASIC METABOLIC PANEL
BUN: 15 mg/dL (ref 6–23)
CO2: 25 mEq/L (ref 19–32)
Calcium: 9.3 mg/dL (ref 8.4–10.5)
Chloride: 98 mEq/L (ref 96–112)
Creatinine, Ser: 0.73 mg/dL (ref 0.40–1.20)
GFR: 83.56 mL/min (ref >60.00)
Glucose, Bld: 161 mg/dL — ABNORMAL HIGH (ref 70–99)
Potassium: 3.9 mEq/L (ref 3.5–5.1)
Sodium: 133 mEq/L — ABNORMAL LOW (ref 135–145)

## 2021-01-12 LAB — HEMOGLOBIN A1C: Hgb A1c MFr Bld: 7.7 % — ABNORMAL HIGH (ref 4.6–6.5)

## 2021-01-12 LAB — LIPID PANEL
Cholesterol: 201 mg/dL — ABNORMAL HIGH (ref 0–200)
HDL: 79.4 mg/dL (ref >39.00)
NonHDL: 121.16
Total CHOL/HDL Ratio: 3
Triglycerides: 224 mg/dL — ABNORMAL HIGH (ref 0.0–149.0)
VLDL: 44.8 mg/dL — ABNORMAL HIGH (ref 0.0–40.0)

## 2021-01-12 LAB — LDL CHOLESTEROL, DIRECT: Direct LDL: 88 mg/dL

## 2021-01-12 MED ORDER — MELOXICAM 15 MG PO TABS: 15.0000 mg | ORAL_TABLET | Freq: Every day | ORAL | 1 refills | Status: DC

## 2021-01-12 NOTE — Patient Instructions (Signed)
Schedule your complete physical in 6 months We'll notify you of your lab results and make any changes if needed START the Meloxicam once daily- take w/ food AVOID other anti-inflammatories like ibuprofen/aleve/motrin Take tylenol/acetaminophen as needed for breakthrough pain Call with any questions or concerns Stay Safe!  Stay Healthy! Happy Spring!!!

## 2021-01-12 NOTE — Progress Notes (Signed)
   Subjective:    Patient ID: Ashley Shaffer, female    DOB: 14-Feb-1951, 70 y.o.   MRN: [id number]  HPI DM- chronic problem, on Metformin 1000mg  BID, Onglyza 5mg  daily w/ hx of good control.  Recent steroids from ortho caused sugars to run high- 3 days at 20mg .  UTD on microalbumin, eye exam, foot exam.  No CP, SOB, HAs, visual changes, edema.  Hypothyroid- chronic problem, on Levothyroxine 115mcg daily.  Energy level is good.  No changes to skin/hair/nails  Arthritis- chronic problem, seeing Dr Layne Benton.  Is 'living on ibuprofen and famotidine'.  Dr Layne Benton suggested possible switch to Meloxicam.  Wants to know my thoughts on that.   Review of Systems For ROS see HPI   This visit occurred during the SARS-CoV-2 public health emergency.  Safety protocols were in place, including screening questions prior to the visit, additional usage of staff PPE, and extensive cleaning of exam room while observing appropriate contact time as indicated for disinfecting solutions.       Objective:   Physical Exam Vitals reviewed.  Constitutional:      General: She is not in acute distress.    Appearance: Normal appearance. She is well-developed. She is not ill-appearing.  HENT:     Head: Normocephalic and atraumatic.  Eyes:     Conjunctiva/sclera: Conjunctivae normal.     Pupils: Pupils are equal, round, and reactive to light.  Neck:     Thyroid: No thyromegaly.  Cardiovascular:     Rate and Rhythm: Normal rate and regular rhythm.     Pulses: Normal pulses.     Heart sounds: Normal heart sounds. No murmur heard.   Pulmonary:     Effort: Pulmonary effort is normal. No respiratory distress.     Breath sounds: Normal breath sounds.  Abdominal:     General: There is no distension.     Palpations: Abdomen is soft.     Tenderness: There is no abdominal tenderness.  Musculoskeletal:     Cervical back: Normal range of motion and neck supple.     Right lower leg: No edema.     Left lower leg:  No edema.  Lymphadenopathy:     Cervical: No cervical adenopathy.  Skin:    General: Skin is warm and dry.  Neurological:     Mental Status: She is alert and oriented to person, place, and time.  Psychiatric:        Behavior: Behavior normal.           Assessment & Plan:

## 2021-01-12 NOTE — Assessment & Plan Note (Signed)
New to provider, ongoing for pt.  Will start Meloxicam once daily rather than relying on multiple ibuprofen.  Pt expressed understanding and is in agreement w/ plan.

## 2021-01-12 NOTE — Assessment & Plan Note (Signed)
Chronic problem.  Tolerating Metformin 1000mg  BID and Onglyza 5mg  daily w/ hx of good control.  She has been stressed recently b/c sugars have been high after oral prednisone for knee pain.  UTD on eye exam, foot exam, and microalbumin.  Check labs.  Adjust meds prn

## 2021-01-12 NOTE — Assessment & Plan Note (Signed)
Chronic problem.  Currently asymptomatic on Levothyroxine 117mcg daily.  Check labs.  Adjust meds prn

## 2021-02-04 ENCOUNTER — Telehealth: Payer: Self-pay | Admitting: Family Medicine

## 2021-02-04 NOTE — Telephone Encounter (Signed)
Left message for patient to schedule Annual Wellness Visit.  Please schedule with Nurse Health Advisor Martha Stanley, RN at [facility]  

## 2021-02-19 DIAGNOSIS — M1711 Unilateral primary osteoarthritis, right knee: Secondary | ICD-10-CM | POA: Diagnosis not present

## 2021-02-23 ENCOUNTER — Other Ambulatory Visit: Payer: Self-pay

## 2021-02-23 ENCOUNTER — Ambulatory Visit
Admission: RE | Admit: 2021-02-23 | Discharge: 2021-02-23 | Disposition: A | Discharge status: Home / Self Care | Payer: Medicare HMO | Source: Ambulatory Visit | Attending: Family Medicine | Admitting: Family Medicine

## 2021-02-23 DIAGNOSIS — Z1231 Encounter for screening mammogram for malignant neoplasm of breast: Secondary | ICD-10-CM

## 2021-03-17 ENCOUNTER — Encounter: Payer: Self-pay | Admitting: Family Medicine

## 2021-03-24 ENCOUNTER — Encounter: Payer: Self-pay | Admitting: Family Medicine

## 2021-03-24 ENCOUNTER — Ambulatory Visit (INDEPENDENT_AMBULATORY_CARE_PROVIDER_SITE_OTHER): Payer: Medicare HMO | Admitting: Family Medicine

## 2021-03-24 ENCOUNTER — Other Ambulatory Visit: Payer: Self-pay

## 2021-03-24 VITALS — BP 117/80 | Temp 97.4°F | Resp 18 | Ht 63.0 in | Wt 131.4 lb

## 2021-03-24 DIAGNOSIS — E119 Type 2 diabetes mellitus without complications: Secondary | ICD-10-CM | POA: Diagnosis not present

## 2021-03-24 DIAGNOSIS — Z1159 Encounter for screening for other viral diseases: Secondary | ICD-10-CM | POA: Diagnosis not present

## 2021-03-24 LAB — BASIC METABOLIC PANEL
BUN: 16 mg/dL (ref 6–23)
CO2: 28 mEq/L (ref 19–32)
Calcium: 9.6 mg/dL (ref 8.4–10.5)
Chloride: 98 mEq/L (ref 96–112)
Creatinine, Ser: 0.61 mg/dL (ref 0.40–1.20)
GFR: 90.71 mL/min (ref >60.00)
Glucose, Bld: 194 mg/dL — ABNORMAL HIGH (ref 70–99)
Potassium: 4 mEq/L (ref 3.5–5.1)
Sodium: 134 mEq/L — ABNORMAL LOW (ref 135–145)

## 2021-03-24 LAB — HEMOGLOBIN A1C: Hgb A1c MFr Bld: 7.8 % — ABNORMAL HIGH (ref 4.6–6.5)

## 2021-03-24 NOTE — Progress Notes (Signed)
   Subjective:    Patient ID: Ashley Shaffer, female    DOB: 1951-06-27, 70 y.o.   MRN: [id number]  HPI DM- chronic problem, on Metformin 1000mg  BID, Onglyza 5mg  daily.  UTD on foot exam, microalbumin.  Due for eye exam.  Pt is down 5 lbs since last visit.  No CP, SOB, HAs, visual changes, edema, abd pain, N/V.  + fatigue.  Denies symptomatic lows.  No numbness/tingling of hands/feet   Review of Systems For ROS see HPI   This visit occurred during the SARS-CoV-2 public health emergency.  Safety protocols were in place, including screening questions prior to the visit, additional usage of staff PPE, and extensive cleaning of exam room while observing appropriate contact time as indicated for disinfecting solutions.       Objective:   Physical Exam Vitals reviewed.  Constitutional:      General: She is not in acute distress.    Appearance: Normal appearance. She is well-developed. She is not ill-appearing.  HENT:     Head: Normocephalic and atraumatic.  Eyes:     Conjunctiva/sclera: Conjunctivae normal.     Pupils: Pupils are equal, round, and reactive to light.  Neck:     Thyroid: No thyromegaly.  Cardiovascular:     Rate and Rhythm: Normal rate and regular rhythm.     Pulses: Normal pulses.     Heart sounds: Normal heart sounds. No murmur heard.   Pulmonary:     Effort: Pulmonary effort is normal. No respiratory distress.     Breath sounds: Normal breath sounds.  Abdominal:     General: There is no distension.     Palpations: Abdomen is soft.     Tenderness: There is no abdominal tenderness.  Musculoskeletal:     Cervical back: Normal range of motion and neck supple.     Left lower leg: No edema.  Lymphadenopathy:     Cervical: No cervical adenopathy.  Skin:    General: Skin is warm and dry.  Neurological:     Mental Status: She is alert and oriented to person, place, and time.  Psychiatric:        Behavior: Behavior normal.           Assessment & Plan:

## 2021-03-24 NOTE — Assessment & Plan Note (Signed)
Chronic problem.  Pt reports she had a steroid injxn in her knee and she's worried that sugars will be high.  Due for eye exam- pt to schedule.  UTD on foot exam, microalbumin.  Check labs.  Adjust meds prn

## 2021-03-24 NOTE — Patient Instructions (Signed)
Schedule your complete physical after 9/13 We'll notify you of your lab results and make any changes if needed Keep up the good work on healthy diet and regular exercise- you're doing great! Call and schedule your eye exam Call with any questions or concerns Stay Safe!  Stay Healthy! Have a great summer!!!

## 2021-03-25 LAB — HEPATITIS C ANTIBODY
Hepatitis C Ab: NONREACTIVE
SIGNAL TO CUT-OFF: 0 (ref <1.00)

## 2021-04-07 DIAGNOSIS — M25551 Pain in right hip: Secondary | ICD-10-CM | POA: Diagnosis not present

## 2021-04-07 DIAGNOSIS — M1711 Unilateral primary osteoarthritis, right knee: Secondary | ICD-10-CM | POA: Diagnosis not present

## 2021-04-08 ENCOUNTER — Encounter: Payer: Self-pay | Admitting: Family Medicine

## 2021-04-08 ENCOUNTER — Other Ambulatory Visit: Payer: Self-pay

## 2021-04-08 MED ORDER — BUPROPION HCL ER (SR) 100 MG PO TB12: 100.0000 mg | ORAL_TABLET | Freq: Two times a day (BID) | ORAL | 1 refills | Status: DC

## 2021-04-09 ENCOUNTER — Telehealth: Payer: Self-pay | Admitting: Family Medicine

## 2021-04-09 NOTE — Chronic Care Management (AMB) (Signed)
  Chronic Care Management   Note  04/09/2021 Name: Ashley Shaffer MRN: [id number] DOB: [birth year]  Ashley Shaffer is a 70 y.o. year old female who is a primary care patient of Birdie Riddle, Aundra Millet, MD. I reached out to Kenna Gilbert by phone today in response to a referral sent by Ashley Shaffer's PCP, Midge Minium, MD.   Ashley Shaffer was given information about Chronic Care Management services today including:  CCM service includes personalized support from designated clinical staff supervised by her physician, including individualized plan of care and coordination with other care providers 24/7 contact phone numbers for assistance for urgent and routine care needs. Service will only be billed when office clinical staff spend 20 minutes or more in a month to coordinate care. Only one practitioner may furnish and bill the service in a calendar month. The patient may stop CCM services at any time (effective at the end of the month) by phone call to the office staff.   Patient did not agree to enrollment in care management services and does not wish to consider at this time.  Follow up plan:   Ashley Shaffer [employer] Scheduler

## 2021-04-09 NOTE — Progress Notes (Signed)
  Chronic Care Management   Outreach Note  04/09/2021 Name: Roshni Burbano MRN: [id number] DOB: [birth year]  Referred by: Midge Minium, MD Reason for referral : No chief complaint on file.   An unsuccessful telephone outreach was attempted today. The patient was referred to the pharmacist for assistance with care management and care coordination.   Follow Up Plan:   Lauretta Grill [employer] Scheduler

## 2021-04-13 DIAGNOSIS — M6281 Muscle weakness (generalized): Secondary | ICD-10-CM | POA: Diagnosis not present

## 2021-04-13 DIAGNOSIS — M25561 Pain in right knee: Secondary | ICD-10-CM | POA: Diagnosis not present

## 2021-04-13 DIAGNOSIS — R2689 Other abnormalities of gait and mobility: Secondary | ICD-10-CM | POA: Diagnosis not present

## 2021-04-15 ENCOUNTER — Other Ambulatory Visit: Payer: Self-pay | Admitting: Family Medicine

## 2021-04-16 DIAGNOSIS — M25561 Pain in right knee: Secondary | ICD-10-CM | POA: Diagnosis not present

## 2021-04-16 DIAGNOSIS — M6281 Muscle weakness (generalized): Secondary | ICD-10-CM | POA: Diagnosis not present

## 2021-04-16 DIAGNOSIS — R2689 Other abnormalities of gait and mobility: Secondary | ICD-10-CM | POA: Diagnosis not present

## 2021-04-20 DIAGNOSIS — M6281 Muscle weakness (generalized): Secondary | ICD-10-CM | POA: Diagnosis not present

## 2021-04-20 DIAGNOSIS — R2689 Other abnormalities of gait and mobility: Secondary | ICD-10-CM | POA: Diagnosis not present

## 2021-04-20 DIAGNOSIS — M25561 Pain in right knee: Secondary | ICD-10-CM | POA: Diagnosis not present

## 2021-04-22 DIAGNOSIS — M6281 Muscle weakness (generalized): Secondary | ICD-10-CM | POA: Diagnosis not present

## 2021-04-22 DIAGNOSIS — M25561 Pain in right knee: Secondary | ICD-10-CM | POA: Diagnosis not present

## 2021-04-22 DIAGNOSIS — R2689 Other abnormalities of gait and mobility: Secondary | ICD-10-CM | POA: Diagnosis not present

## 2021-04-26 ENCOUNTER — Encounter: Payer: Self-pay | Admitting: Family Medicine

## 2021-04-26 ENCOUNTER — Other Ambulatory Visit: Payer: Self-pay

## 2021-04-26 DIAGNOSIS — M6281 Muscle weakness (generalized): Secondary | ICD-10-CM | POA: Diagnosis not present

## 2021-04-26 DIAGNOSIS — E038 Other specified hypothyroidism: Secondary | ICD-10-CM

## 2021-04-26 DIAGNOSIS — M25561 Pain in right knee: Secondary | ICD-10-CM | POA: Diagnosis not present

## 2021-04-26 DIAGNOSIS — R2689 Other abnormalities of gait and mobility: Secondary | ICD-10-CM | POA: Diagnosis not present

## 2021-04-26 MED ORDER — LEVOTHYROXINE SODIUM 150 MCG PO TABS: ORAL_TABLET | ORAL | 1 refills | Status: DC

## 2021-04-28 ENCOUNTER — Encounter: Payer: Self-pay | Admitting: *Deleted

## 2021-05-05 DIAGNOSIS — R2689 Other abnormalities of gait and mobility: Secondary | ICD-10-CM | POA: Diagnosis not present

## 2021-05-05 DIAGNOSIS — M25561 Pain in right knee: Secondary | ICD-10-CM | POA: Diagnosis not present

## 2021-05-05 DIAGNOSIS — M6281 Muscle weakness (generalized): Secondary | ICD-10-CM | POA: Diagnosis not present

## 2021-05-07 DIAGNOSIS — M25561 Pain in right knee: Secondary | ICD-10-CM | POA: Diagnosis not present

## 2021-05-07 DIAGNOSIS — R2689 Other abnormalities of gait and mobility: Secondary | ICD-10-CM | POA: Diagnosis not present

## 2021-05-07 DIAGNOSIS — M6281 Muscle weakness (generalized): Secondary | ICD-10-CM | POA: Diagnosis not present

## 2021-05-10 DIAGNOSIS — H5203 Hypermetropia, bilateral: Secondary | ICD-10-CM | POA: Diagnosis not present

## 2021-05-11 DIAGNOSIS — M25561 Pain in right knee: Secondary | ICD-10-CM | POA: Diagnosis not present

## 2021-05-11 DIAGNOSIS — R2689 Other abnormalities of gait and mobility: Secondary | ICD-10-CM | POA: Diagnosis not present

## 2021-05-11 DIAGNOSIS — M6281 Muscle weakness (generalized): Secondary | ICD-10-CM | POA: Diagnosis not present

## 2021-05-13 DIAGNOSIS — R2689 Other abnormalities of gait and mobility: Secondary | ICD-10-CM | POA: Diagnosis not present

## 2021-05-13 DIAGNOSIS — M6281 Muscle weakness (generalized): Secondary | ICD-10-CM | POA: Diagnosis not present

## 2021-05-13 DIAGNOSIS — M25561 Pain in right knee: Secondary | ICD-10-CM | POA: Diagnosis not present

## 2021-05-18 DIAGNOSIS — M25561 Pain in right knee: Secondary | ICD-10-CM | POA: Diagnosis not present

## 2021-05-18 DIAGNOSIS — R2689 Other abnormalities of gait and mobility: Secondary | ICD-10-CM | POA: Diagnosis not present

## 2021-05-18 DIAGNOSIS — M6281 Muscle weakness (generalized): Secondary | ICD-10-CM | POA: Diagnosis not present

## 2021-05-21 DIAGNOSIS — R2689 Other abnormalities of gait and mobility: Secondary | ICD-10-CM | POA: Diagnosis not present

## 2021-05-21 DIAGNOSIS — M25561 Pain in right knee: Secondary | ICD-10-CM | POA: Diagnosis not present

## 2021-05-21 DIAGNOSIS — M6281 Muscle weakness (generalized): Secondary | ICD-10-CM | POA: Diagnosis not present

## 2021-05-24 DIAGNOSIS — M25551 Pain in right hip: Secondary | ICD-10-CM | POA: Diagnosis not present

## 2021-05-27 ENCOUNTER — Telehealth: Payer: Self-pay | Admitting: Family Medicine

## 2021-05-27 NOTE — Telephone Encounter (Signed)
Patient declined AWV do not call  

## 2021-06-21 DIAGNOSIS — M6281 Muscle weakness (generalized): Secondary | ICD-10-CM | POA: Diagnosis not present

## 2021-06-21 DIAGNOSIS — M25561 Pain in right knee: Secondary | ICD-10-CM | POA: Diagnosis not present

## 2021-06-21 DIAGNOSIS — R2689 Other abnormalities of gait and mobility: Secondary | ICD-10-CM | POA: Diagnosis not present

## 2021-06-25 DIAGNOSIS — M6281 Muscle weakness (generalized): Secondary | ICD-10-CM | POA: Diagnosis not present

## 2021-06-25 DIAGNOSIS — R2689 Other abnormalities of gait and mobility: Secondary | ICD-10-CM | POA: Diagnosis not present

## 2021-06-25 DIAGNOSIS — M25561 Pain in right knee: Secondary | ICD-10-CM | POA: Diagnosis not present

## 2021-06-29 ENCOUNTER — Encounter (HOSPITAL_BASED_OUTPATIENT_CLINIC_OR_DEPARTMENT_OTHER): Payer: Self-pay | Admitting: *Deleted

## 2021-06-29 ENCOUNTER — Other Ambulatory Visit: Payer: Self-pay

## 2021-06-29 DIAGNOSIS — R110 Nausea: Secondary | ICD-10-CM | POA: Insufficient documentation

## 2021-06-29 DIAGNOSIS — R42 Dizziness and giddiness: Secondary | ICD-10-CM | POA: Insufficient documentation

## 2021-06-29 DIAGNOSIS — E039 Hypothyroidism, unspecified: Secondary | ICD-10-CM | POA: Diagnosis not present

## 2021-06-29 DIAGNOSIS — Z20822 Contact with and (suspected) exposure to covid-19: Secondary | ICD-10-CM | POA: Diagnosis not present

## 2021-06-29 DIAGNOSIS — Z79899 Other long term (current) drug therapy: Secondary | ICD-10-CM | POA: Diagnosis not present

## 2021-06-29 DIAGNOSIS — E1165 Type 2 diabetes mellitus with hyperglycemia: Secondary | ICD-10-CM | POA: Diagnosis not present

## 2021-06-29 DIAGNOSIS — R2689 Other abnormalities of gait and mobility: Secondary | ICD-10-CM | POA: Diagnosis not present

## 2021-06-29 DIAGNOSIS — Z7984 Long term (current) use of oral hypoglycemic drugs: Secondary | ICD-10-CM | POA: Diagnosis not present

## 2021-06-29 DIAGNOSIS — M25561 Pain in right knee: Secondary | ICD-10-CM | POA: Diagnosis not present

## 2021-06-29 DIAGNOSIS — R519 Headache, unspecified: Secondary | ICD-10-CM | POA: Diagnosis not present

## 2021-06-29 DIAGNOSIS — M6281 Muscle weakness (generalized): Secondary | ICD-10-CM | POA: Diagnosis not present

## 2021-06-29 LAB — BASIC METABOLIC PANEL
Anion gap: 9 (ref 5–15)
BUN: 21 mg/dL (ref 8–23)
CO2: 24 mmol/L (ref 22–32)
Calcium: 9.7 mg/dL (ref 8.9–10.3)
Chloride: 99 mmol/L (ref 98–111)
Creatinine, Ser: 0.69 mg/dL (ref 0.44–1.00)
GFR, Estimated: >60 mL/min (ref >60)
Glucose, Bld: 239 mg/dL — ABNORMAL HIGH (ref 70–99)
Potassium: 4.1 mmol/L (ref 3.5–5.1)
Sodium: 132 mmol/L — ABNORMAL LOW (ref 135–145)

## 2021-06-29 LAB — CBC
HCT: 38.2 % (ref 36.0–46.0)
Hemoglobin: 12.8 g/dL (ref 12.0–15.0)
MCH: 29.6 pg (ref 26.0–34.0)
MCHC: 33.5 g/dL (ref 30.0–36.0)
MCV: 88.2 fL (ref 80.0–100.0)
Platelets: 301 10*3/uL (ref 150–400)
RBC: 4.33 MIL/uL (ref 3.87–5.11)
RDW: 11.9 % (ref 11.5–15.5)
WBC: 7.9 10*3/uL (ref 4.0–10.5)
nRBC: 0 % (ref 0.0–0.2)

## 2021-06-29 LAB — CBG MONITORING, ED: Glucose-Capillary: 238 mg/dL — ABNORMAL HIGH (ref 70–99)

## 2021-06-29 NOTE — ED Triage Notes (Signed)
While at rehab therapy, patient felt dizzy when the therapist asked her to roll over.  Pt is diabetic, CBG was 191 after the Therapist gave her the apple juice.

## 2021-06-30 ENCOUNTER — Emergency Department (HOSPITAL_COMMUNITY): Payer: Medicare HMO

## 2021-06-30 ENCOUNTER — Emergency Department (HOSPITAL_BASED_OUTPATIENT_CLINIC_OR_DEPARTMENT_OTHER)
Admission: EM | Admit: 2021-06-30 | Discharge: 2021-06-30 | Disposition: A | Discharge status: Home / Self Care | Payer: Medicare HMO | Attending: Emergency Medicine | Admitting: Emergency Medicine

## 2021-06-30 DIAGNOSIS — Z79899 Other long term (current) drug therapy: Secondary | ICD-10-CM | POA: Diagnosis not present

## 2021-06-30 DIAGNOSIS — R519 Headache, unspecified: Secondary | ICD-10-CM | POA: Diagnosis not present

## 2021-06-30 DIAGNOSIS — Z20822 Contact with and (suspected) exposure to covid-19: Secondary | ICD-10-CM | POA: Diagnosis not present

## 2021-06-30 DIAGNOSIS — R42 Dizziness and giddiness: Secondary | ICD-10-CM | POA: Diagnosis not present

## 2021-06-30 DIAGNOSIS — Z7984 Long term (current) use of oral hypoglycemic drugs: Secondary | ICD-10-CM | POA: Diagnosis not present

## 2021-06-30 DIAGNOSIS — E039 Hypothyroidism, unspecified: Secondary | ICD-10-CM | POA: Diagnosis not present

## 2021-06-30 DIAGNOSIS — E1165 Type 2 diabetes mellitus with hyperglycemia: Secondary | ICD-10-CM | POA: Diagnosis not present

## 2021-06-30 DIAGNOSIS — R110 Nausea: Secondary | ICD-10-CM | POA: Diagnosis not present

## 2021-06-30 DIAGNOSIS — R739 Hyperglycemia, unspecified: Secondary | ICD-10-CM

## 2021-06-30 LAB — RESP PANEL BY RT-PCR (FLU A&B, COVID) ARPGX2
Influenza A by PCR: NEGATIVE
Influenza B by PCR: NEGATIVE
SARS Coronavirus 2 by RT PCR: NEGATIVE

## 2021-06-30 LAB — CBG MONITORING, ED: Glucose-Capillary: 192 mg/dL — ABNORMAL HIGH (ref 70–99)

## 2021-06-30 MED ORDER — MECLIZINE HCL 25 MG PO TABS: 25.0000 mg | ORAL_TABLET | Freq: Three times a day (TID) | ORAL | 0 refills | Status: DC | PRN

## 2021-06-30 MED ORDER — MECLIZINE HCL 25 MG PO TABS
25.0000 mg | ORAL_TABLET | Freq: Once | ORAL | Status: AC
  Administered 2021-06-30: 25 mg via ORAL
  Filled 2021-06-30: qty 1

## 2021-06-30 NOTE — ED Provider Notes (Signed)
[facility] EMERGENCY DEPT Provider Note   CSN: [id number] Arrival date & time: 06/29/21  1820     History Chief Complaint  Patient presents with   Dizziness    Monnie Gudgel is a 70 y.o. female.  The history is provided by the patient and the spouse.  Dizziness Quality:  Room spinning and imbalance Severity:  Severe Onset quality:  Sudden Duration:  6 hours Timing:  Constant Progression:  Improving Chronicity:  New Context: head movement   Relieved by:  Being still Worsened by:  Standing up Associated symptoms: headaches and nausea   Associated symptoms: no chest pain, no hearing loss, no syncope, no tinnitus, no vision changes, no vomiting and no weakness   Risk factors: no hx of stroke   Patient with history of diabetes presents with dizziness and vertigo.  Patient reports she was undergoing physical therapy for hip and knee pain.  When she rolled on the table she had sudden onset of vertigo as if things were spinning.  No falls or syncope.  She reports headache and nausea.  No focal weakness.  No visual changes.     Past Medical History:  Diagnosis Date   Basal cell carcinoma    Depression    Diabetes mellitus without complication ([place])    type II   Hyperlipidemia    Ischemic colitis ([place])    Left ACL tear    Osteopenia    Thyroid disease    thyroid removed age 54 due to benign mass    Patient Active Problem List   Diagnosis Date Noted   Arthritis of knee, degenerative 01/12/2021   Hyperlipidemia 04/03/2017   Retinal scar of left eye 06/29/2016   Physical exam 04/01/2016   Diabetes mellitus type II, controlled, with no complications ([place]) 28/36/6294   Hypothyroid 01/27/2016   Depression 01/27/2016   Idiopathic scoliosis 01/27/2016    Past Surgical History:  Procedure Laterality Date   BREAST BIOPSY Left    Benign   BREAST MASS EXCISION Right 1998   BUNIONECTOMY  1997   KNEE ARTHROSCOPY  12/04/07   SHOULDER SURGERY Right  08/10/06   THYROIDECTOMY  1978   due to enlarged goiter   TONSILLECTOMY     TUBAL LIGATION       OB History     Gravida  0   Para  0   Term  0   Preterm  0   AB  0   Living  0      SAB  0   IAB  0   Ectopic  0   Multiple  0   Live Births  0           Family History  Problem Relation Age of Onset   Heart disease Mother    Healthy Sister    Heart disease Brother    Diabetes Maternal Aunt     Social History   Tobacco Use   Smoking status: Never   Smokeless tobacco: Never  Vaping Use   Vaping Use: Never used  Substance Use Topics   Alcohol use: Yes    Alcohol/week: 2.0 standard drinks    Types: 2 Glasses of wine per week    Comment: weekly   Drug use: No    Home Medications Prior to Admission medications   Medication Sig Start Date End Date Taking? Authorizing Provider  buPROPion (WELLBUTRIN SR) 100 MG 12 hr tablet Take 1 tablet (100 mg total) by mouth 2 (two) times daily.  04/08/21  Yes Midge Minium, MD  levothyroxine (SYNTHROID) 150 MCG tablet TAKE 1 TABLET EVERY DAY BEFORE BREAKFAST 04/26/21  Yes Midge Minium, MD  metFORMIN (GLUCOPHAGE-XR) 500 MG 24 hr tablet Take 2 tablets (1,000 mg total) by mouth 2 (two) times daily. 11/12/20  Yes Midge Minium, MD  ONGLYZA 5 MG TABS tablet TAKE 1 TABLET(5 MG) BY MOUTH DAILY 01/11/21  Yes Midge Minium, MD  ACCU-CHEK SOFTCLIX LANCETS lancets Use one lancet each time sugars are tested. Dx. E11.9 11/14/18   Midge Minium, MD  Blood Glucose Monitoring Suppl (ACCU-CHEK AVIVA PLUS) w/Device KIT Please use new glucometer to check sugar levels daily. Dx E11.9 10/19/18   Midge Minium, MD  glucose blood (ACCU-CHEK AVIVA PLUS) test strip Please use one strip each time sugars are tested. Pt checks sugars three times daily. Dx E11.9 10/19/18   Midge Minium, MD  meloxicam (MOBIC) 15 MG tablet Take 1 tablet (15 mg total) by mouth daily. 01/12/21   Midge Minium, MD  [place] 30  MG/2ML SOSY  12/09/19   [provider]    Allergies    Patient has no known allergies.  Review of Systems   Review of Systems  Constitutional:  Negative for fever.  HENT:  Negative for hearing loss and tinnitus.   Cardiovascular:  Negative for chest pain and syncope.  Gastrointestinal:  Positive for nausea. Negative for vomiting.  Neurological:  Positive for dizziness and headaches. Negative for syncope and weakness.  All other systems reviewed and are negative.  Physical Exam Updated Vital Signs BP (!) 166/77 (BP Location: Left Arm)   Pulse 62   Temp 98.7 F (37.1 C)   Resp 16   Ht 1.626 m (5' 4")   Wt 59 kg   LMP 10/31/2004 (Approximate)   SpO2 100%   BMI 22.31 kg/m   Physical Exam CONSTITUTIONAL: Elderly, but appears younger than stated age HEAD: Normocephalic/atraumatic EYES: EOMI/PERRL, no nystagmus, no visual field deficit  no ptosis ENMT: Mucous membranes moist, TMs clear and intact NECK: supple no meningeal signs, no bruits CV: S1/S2 noted, no murmurs/rubs/gallops noted LUNGS: Lungs are clear to auscultation bilaterally, no apparent distress ABDOMEN: soft, nontender, no rebound or guarding GU:no cva tenderness NEURO:Awake/alert, face symmetric, no arm or leg drift is noted Equal 5/5 strength with shoulder abduction, elbow flex/extension, wrist flex/extension in upper extremities and equal hand grips bilaterally Equal 5/5 strength with hip flexion,knee flex/extension, foot dorsi/plantar flexion Cranial nerves 3/4/5/6/05/08/09/11/12 tested and intact Patient is unable to stand or walk.  She reports significant symptoms with any movement and is unable to stand or walk No past pointing Sensation to light touch intact in all extremities EXTREMITIES: pulses normal, full ROM SKIN: warm, color normal PSYCH: no abnormalities of mood noted   ED Results / Procedures / Treatments   Labs (all labs ordered are listed, but only abnormal results are  displayed) Labs Reviewed  BASIC METABOLIC PANEL - Abnormal; Notable for the following components:      Result Value   Sodium 132 (*)    Glucose, Bld 239 (*)    All other components within normal limits  CBG MONITORING, ED - Abnormal; Notable for the following components:   Glucose-Capillary 238 (*)    All other components within normal limits  RESP PANEL BY RT-PCR (FLU A&B, COVID) ARPGX2  CBC    EKG EKG Interpretation  Date/Time:  Tuesday June 29 2021 18:43:29 EDT Ventricular Rate:  70 PR Interval:  190 QRS Duration: 98 QT Interval:  388 QTC Calculation: 419 R Axis:   45 Text Interpretation: Sinus rhythm with sinus arrhythmia with occasional Premature ventricular complexes Septal infarct , age undetermined Abnormal ECG No previous ECGs available Confirmed by Ripley Fraise [phone]) on 06/30/2021 12:39:30 AM  Radiology No results found.  Procedures Procedures   Medications Ordered in ED Medications  meclizine (ANTIVERT) tablet 25 mg (has no administration in time range)    ED Course  I have reviewed the triage vital signs and the nursing notes.  Pertinent labs   results that were available during my care of the patient were reviewed by me and considered in my medical decision making (see chart for details).    MDM Rules/Calculators/A&P                          1:03 AM Patient presents with sudden onset of vertigo unable to walk after physical therapy.  Here in the ER with any movement she has significant symptoms and is unable to walk.  Patient will need to have a stroke work-up.  We will transfer to [facility] ER for emergent MRI brain.  Discussed with Dr. Delora Fuel at [facility] to accept in transfer.  Patient agreeable with plan Final Clinical Impression(s) / ED Diagnoses Final diagnoses:  Vertigo  Hyperglycemia    Rx / DC Orders ED Discharge Orders     None        Ripley Fraise, MD 06/30/21 0104

## 2021-06-30 NOTE — ED Provider Notes (Signed)
Patient arrived in the ED, transferred from [place] for MRI of brain to evaluate dizziness.  MRI shows no evidence of stroke.  She had an accident clinical relief with meclizine and is discharged with prescription for same.  Results for orders placed or performed during the hospital encounter of 06/30/21  Resp Panel by RT-PCR (Flu A&B, Covid) Nasopharyngeal Swab   Specimen: Nasopharyngeal Swab; Nasopharyngeal(NP) swabs in vial transport medium  Result Value Ref Range   SARS Coronavirus 2 by RT PCR NEGATIVE NEGATIVE   Influenza A by PCR NEGATIVE NEGATIVE   Influenza B by PCR NEGATIVE NEGATIVE  Basic metabolic panel  Result Value Ref Range   Sodium 132 (L) 135 - 145 mmol/L   Potassium 4.1 3.5 - 5.1 mmol/L   Chloride 99 98 - 111 mmol/L   CO2 24 22 - 32 mmol/L   Glucose, Bld 239 (H) 70 - 99 mg/dL   BUN 21 8 - 23 mg/dL   Creatinine, Ser 0.69 0.44 - 1.00 mg/dL   Calcium 9.7 8.9 - 10.3 mg/dL   GFR, Estimated >60 >60 mL/min   Anion gap 9 5 - 15  CBC  Result Value Ref Range   WBC 7.9 4.0 - 10.5 K/uL   RBC 4.33 3.87 - 5.11 MIL/uL   Hemoglobin 12.8 12.0 - 15.0 g/dL   HCT 38.2 36.0 - 46.0 %   MCV 88.2 80.0 - 100.0 fL   MCH 29.6 26.0 - 34.0 pg   MCHC 33.5 30.0 - 36.0 g/dL   RDW 11.9 11.5 - 15.5 %   Platelets 301 150 - 400 K/uL   nRBC 0.0 0.0 - 0.2 %  CBG monitoring, ED  Result Value Ref Range   Glucose-Capillary 238 (H) 70 - 99 mg/dL  CBG monitoring, ED  Result Value Ref Range   Glucose-Capillary 192 (H) 70 - 99 mg/dL   MR BRAIN WO CONTRAST  Result Date: 06/30/2021 CLINICAL DATA:  Dizziness EXAM: MRI HEAD WITHOUT CONTRAST TECHNIQUE: Multiplanar, multiecho pulse sequences of the brain and surrounding structures were obtained without intravenous contrast. COMPARISON:  None. FINDINGS: Brain: No acute infarct, mass effect or extra-axial collection. No acute or chronic hemorrhage. There is multifocal hyperintense T2-weighted signal within the white matter. Generalized volume  loss without a clear lobar predilection. The midline structures are normal. Vascular: Major flow voids are preserved. Skull and upper cervical spine: Normal calvarium and skull base. Visualized upper cervical spine and soft tissues are normal. Sinuses/Orbits:No paranasal sinus fluid levels or advanced mucosal thickening. No mastoid or middle ear effusion. Normal orbits. IMPRESSION: 1. No acute intracranial abnormality. 2. Findings of chronic microvascular ischemia and generalized volume loss. Electronically Signed   By: Ulyses Jarred M.D.   On: [id number]      Delora Fuel, MD [id number] [phone]

## 2021-06-30 NOTE — Discharge Instructions (Addendum)
Your MRI did not show any evidence of a stroke.  If dizziness recurs, you may take meclizine every 6-8 hours as needed to help control the dizziness.  Return if symptoms are getting worse.

## 2021-06-30 NOTE — ED Notes (Signed)
Care Handoff/Report given to [place]. All Questions Answered.

## 2021-06-30 NOTE — ED Notes (Signed)
Pt transported to MRI 

## 2021-06-30 NOTE — ED Notes (Signed)
Report received from [place], RN with [facility].

## 2021-06-30 NOTE — ED Notes (Signed)
Dr. Glick at bedside.  

## 2021-07-01 DIAGNOSIS — M6281 Muscle weakness (generalized): Secondary | ICD-10-CM | POA: Diagnosis not present

## 2021-07-01 DIAGNOSIS — R2689 Other abnormalities of gait and mobility: Secondary | ICD-10-CM | POA: Diagnosis not present

## 2021-07-01 DIAGNOSIS — M25561 Pain in right knee: Secondary | ICD-10-CM | POA: Diagnosis not present

## 2021-07-08 ENCOUNTER — Other Ambulatory Visit: Payer: Self-pay | Admitting: Family Medicine

## 2021-07-13 DIAGNOSIS — M6281 Muscle weakness (generalized): Secondary | ICD-10-CM | POA: Diagnosis not present

## 2021-07-13 DIAGNOSIS — M25561 Pain in right knee: Secondary | ICD-10-CM | POA: Diagnosis not present

## 2021-07-13 DIAGNOSIS — R2689 Other abnormalities of gait and mobility: Secondary | ICD-10-CM | POA: Diagnosis not present

## 2021-07-14 DIAGNOSIS — M1611 Unilateral primary osteoarthritis, right hip: Secondary | ICD-10-CM | POA: Diagnosis not present

## 2021-07-14 DIAGNOSIS — M47816 Spondylosis without myelopathy or radiculopathy, lumbar region: Secondary | ICD-10-CM | POA: Diagnosis not present

## 2021-07-15 ENCOUNTER — Ambulatory Visit (INDEPENDENT_AMBULATORY_CARE_PROVIDER_SITE_OTHER): Payer: Medicare HMO | Admitting: Family Medicine

## 2021-07-15 ENCOUNTER — Encounter: Payer: Self-pay | Admitting: Family Medicine

## 2021-07-15 ENCOUNTER — Other Ambulatory Visit: Payer: Self-pay

## 2021-07-15 VITALS — BP 120/78 | HR 65 | Temp 97.3°F | Resp 17 | Ht 63.0 in | Wt 134.4 lb

## 2021-07-15 DIAGNOSIS — M6281 Muscle weakness (generalized): Secondary | ICD-10-CM | POA: Diagnosis not present

## 2021-07-15 DIAGNOSIS — R2689 Other abnormalities of gait and mobility: Secondary | ICD-10-CM | POA: Diagnosis not present

## 2021-07-15 DIAGNOSIS — E119 Type 2 diabetes mellitus without complications: Secondary | ICD-10-CM

## 2021-07-15 DIAGNOSIS — Z23 Encounter for immunization: Secondary | ICD-10-CM | POA: Diagnosis not present

## 2021-07-15 DIAGNOSIS — M25561 Pain in right knee: Secondary | ICD-10-CM | POA: Diagnosis not present

## 2021-07-15 DIAGNOSIS — Z0000 Encounter for general adult medical examination without abnormal findings: Secondary | ICD-10-CM

## 2021-07-15 LAB — HEPATIC FUNCTION PANEL
ALT: 17 U/L (ref 0–35)
AST: 16 U/L (ref 0–37)
Albumin: 4.5 g/dL (ref 3.5–5.2)
Alkaline Phosphatase: 77 U/L (ref 39–117)
Bilirubin, Direct: 0.1 mg/dL (ref 0.0–0.3)
Total Bilirubin: 0.5 mg/dL (ref 0.2–1.2)
Total Protein: 6.8 g/dL (ref 6.0–8.3)

## 2021-07-15 LAB — LIPID PANEL
Cholesterol: 216 mg/dL — ABNORMAL HIGH (ref 0–200)
HDL: 80.5 mg/dL (ref >39.00)
LDL Cholesterol: 112 mg/dL — ABNORMAL HIGH (ref 0–99)
NonHDL: 135.48
Total CHOL/HDL Ratio: 3
Triglycerides: 117 mg/dL (ref 0.0–149.0)
VLDL: 23.4 mg/dL (ref 0.0–40.0)

## 2021-07-15 LAB — BASIC METABOLIC PANEL
BUN: 14 mg/dL (ref 6–23)
CO2: 29 mEq/L (ref 19–32)
Calcium: 9.9 mg/dL (ref 8.4–10.5)
Chloride: 99 mEq/L (ref 96–112)
Creatinine, Ser: 0.63 mg/dL (ref 0.40–1.20)
GFR: 89.81 mL/min (ref >60.00)
Glucose, Bld: 160 mg/dL — ABNORMAL HIGH (ref 70–99)
Potassium: 4.3 mEq/L (ref 3.5–5.1)
Sodium: 136 mEq/L (ref 135–145)

## 2021-07-15 LAB — CBC WITH DIFFERENTIAL/PLATELET
Basophils Absolute: 0 10*3/uL (ref 0.0–0.1)
Basophils Relative: 0.6 % (ref 0.0–3.0)
Eosinophils Absolute: 0.3 10*3/uL (ref 0.0–0.7)
Eosinophils Relative: 5 % (ref 0.0–5.0)
HCT: 40.5 % (ref 36.0–46.0)
Hemoglobin: 13.5 g/dL (ref 12.0–15.0)
Lymphocytes Relative: 25.5 % (ref 12.0–46.0)
Lymphs Abs: 1.6 10*3/uL (ref 0.7–4.0)
MCHC: 33.4 g/dL (ref 30.0–36.0)
MCV: 88.6 fl (ref 78.0–100.0)
Monocytes Absolute: 0.7 10*3/uL (ref 0.1–1.0)
Monocytes Relative: 11.7 % (ref 3.0–12.0)
Neutro Abs: 3.5 10*3/uL (ref 1.4–7.7)
Neutrophils Relative %: 57.2 % (ref 43.0–77.0)
Platelets: 317 10*3/uL (ref 150.0–400.0)
RBC: 4.57 Mil/uL (ref 3.87–5.11)
RDW: 12.5 % (ref 11.5–15.5)
WBC: 6.2 10*3/uL (ref 4.0–10.5)

## 2021-07-15 LAB — MICROALBUMIN / CREATININE URINE RATIO
Creatinine,U: 55.8 mg/dL
Microalb Creat Ratio: 4.7 mg/g (ref 0.0–30.0)
Microalb, Ur: 2.6 mg/dL — ABNORMAL HIGH (ref 0.0–1.9)

## 2021-07-15 LAB — TSH: TSH: 3.13 u[IU]/mL (ref 0.35–5.50)

## 2021-07-15 LAB — HEMOGLOBIN A1C: Hgb A1c MFr Bld: 7.7 % — ABNORMAL HIGH (ref 4.6–6.5)

## 2021-07-15 MED ORDER — DICLOFENAC SODIUM 1 % EX GEL: 4.0000 g | Freq: Four times a day (QID) | CUTANEOUS | 3 refills | Status: DC

## 2021-07-15 MED ORDER — BUPROPION HCL ER (SR) 100 MG PO TB12: 100.0000 mg | ORAL_TABLET | Freq: Two times a day (BID) | ORAL | 1 refills | Status: DC

## 2021-07-15 NOTE — Patient Instructions (Addendum)
Follow up in 3-4 months to recheck sugars We'll notify you of your lab results and make any changes if needed Continue to work on healthy diet and regular exercise- you look great! Get shingles shot at the pharmacy at your convenience Get the new COVID booster at your convenience Call with any questions or concerns Happy Fall!!

## 2021-07-15 NOTE — Assessment & Plan Note (Signed)
Chronic problem.  Hx of good control but steroids do cause wild fluctuations in her sugars.  UTD on eye exam, foot exam done today.  Microalbumin ordered.  Check labs.  Adjust meds prn

## 2021-07-15 NOTE — Assessment & Plan Note (Signed)
Pt's PE WNL.  UTD on colonoscopy, mammo, eye exam, DEXA, pneumonia vaccines.  Flu shot given today.  Check labs.  Anticipatory guidance provided.

## 2021-07-15 NOTE — Progress Notes (Signed)
   Subjective:    Patient ID: Ashley Shaffer, female    DOB: 04-24-1951, 70 y.o.   MRN: [id number]  HPI CPE- UTD on colonoscopy, mammo, eye exam.  UTD on DEXA.  UTD on pneumonia vaccines.  Due for flu.  Had Tdap at pharmacy.  Due for foot exam.  Patient Care Team    Relationship Specialty Notifications Start End  Midge Minium, MD PCP - General Family Medicine  01/22/16   Nunzio Cobbs, MD Consulting Physician Obstetrics and Gynecology  04/24/20     Health Maintenance  Topic Date Due  . Zoster Vaccines- Shingrix (1 of 2) Never done  . TETANUS/TDAP  10/14/2017  . OPHTHALMOLOGY EXAM  02/05/2021  . INFLUENZA VACCINE  05/31/2021  . COVID-19 Vaccine (4 - Booster for [place] series) 06/25/2021  . FOOT EXAM  07/13/2021  . URINE MICROALBUMIN  07/13/2021  . HEMOGLOBIN A1C  09/24/2021  . MAMMOGRAM  02/24/2023  . COLONOSCOPY (Pts 45-66yr Insurance coverage will need to be confirmed)  07/02/2023  . DEXA SCAN  Completed  . Hepatitis C Screening  Completed  . PNA vac Low Risk Adult  Completed  . HPV VACCINES  Aged Out      Review of Systems Patient reports no vision/ hearing changes, adenopathy,fever, weight change,  persistant/recurrent hoarseness , swallowing issues, chest pain, palpitations, edema, persistant/recurrent cough, hemoptysis, dyspnea (rest/exertional/paroxysmal nocturnal), gastrointestinal bleeding (melena, rectal bleeding), abdominal pain, significant heartburn, bowel changes, GU symptoms (dysuria, hematuria, incontinence), Gyn symptoms (abnormal  bleeding, pain),  syncope, focal weakness, memory loss, numbness & tingling, skin/hair/nail changes, abnormal bruising or bleeding, anxiety, or depression.   This visit occurred during the SARS-CoV-2 public health emergency.  Safety protocols were in place, including screening questions prior to the visit, additional usage of staff PPE, and extensive cleaning of exam room while observing appropriate contact time as  indicated for disinfecting solutions.      Objective:   Physical Exam General Appearance:    Alert, cooperative, no distress, appears stated age  Head:    Normocephalic, without obvious abnormality, atraumatic  Eyes:    PERRL, conjunctiva/corneas clear, EOM's intact, fundi    benign, both eyes  Ears:    Normal TM's and external ear canals, both ears  Nose:   Deferred due to COVID  Throat:   Neck:   Supple, symmetrical, trachea midline, no adenopathy;    Thyroid: no enlargement/tenderness/nodules  Back:     Symmetric, no curvature, ROM normal, no CVA tenderness  Lungs:     Clear to auscultation bilaterally, respirations unlabored  Chest Wall:    No tenderness or deformity   Heart:    Regular rate and rhythm, S1 and S2 normal, no murmur, rub   or gallop  Breast Exam:    Deferred to mammo  Abdomen:     Soft, non-tender, bowel sounds active all four quadrants,    no masses, no organomegaly  Genitalia:    Deferred  Rectal:    Extremities:   Extremities normal, atraumatic, no cyanosis or edema  Pulses:   2+ and symmetric all extremities  Skin:   Skin color, texture, turgor normal, no rashes or lesions  Lymph nodes:   Cervical, supraclavicular, and axillary nodes normal  Neurologic:   CNII-XII intact, normal strength, sensation and reflexes    throughout          Assessment & Plan:

## 2021-07-20 DIAGNOSIS — M25561 Pain in right knee: Secondary | ICD-10-CM | POA: Diagnosis not present

## 2021-07-20 DIAGNOSIS — R2689 Other abnormalities of gait and mobility: Secondary | ICD-10-CM | POA: Diagnosis not present

## 2021-07-20 DIAGNOSIS — M6281 Muscle weakness (generalized): Secondary | ICD-10-CM | POA: Diagnosis not present

## 2021-07-22 ENCOUNTER — Other Ambulatory Visit: Payer: Self-pay | Admitting: Family Medicine

## 2021-07-22 DIAGNOSIS — M25561 Pain in right knee: Secondary | ICD-10-CM | POA: Diagnosis not present

## 2021-07-22 DIAGNOSIS — R2689 Other abnormalities of gait and mobility: Secondary | ICD-10-CM | POA: Diagnosis not present

## 2021-07-22 DIAGNOSIS — E038 Other specified hypothyroidism: Secondary | ICD-10-CM

## 2021-07-22 DIAGNOSIS — M6281 Muscle weakness (generalized): Secondary | ICD-10-CM | POA: Diagnosis not present

## 2021-07-28 DIAGNOSIS — M25551 Pain in right hip: Secondary | ICD-10-CM | POA: Diagnosis not present

## 2021-08-05 DIAGNOSIS — M25551 Pain in right hip: Secondary | ICD-10-CM | POA: Diagnosis not present

## 2021-08-11 DIAGNOSIS — M1712 Unilateral primary osteoarthritis, left knee: Secondary | ICD-10-CM | POA: Diagnosis not present

## 2021-08-11 DIAGNOSIS — M25459 Effusion, unspecified hip: Secondary | ICD-10-CM | POA: Diagnosis not present

## 2021-08-11 DIAGNOSIS — M25452 Effusion, left hip: Secondary | ICD-10-CM | POA: Diagnosis not present

## 2021-08-11 DIAGNOSIS — R5383 Other fatigue: Secondary | ICD-10-CM | POA: Diagnosis not present

## 2021-08-11 DIAGNOSIS — M2550 Pain in unspecified joint: Secondary | ICD-10-CM | POA: Diagnosis not present

## 2021-08-24 DIAGNOSIS — M1712 Unilateral primary osteoarthritis, left knee: Secondary | ICD-10-CM | POA: Diagnosis not present

## 2021-08-25 DIAGNOSIS — M6281 Muscle weakness (generalized): Secondary | ICD-10-CM | POA: Diagnosis not present

## 2021-08-25 DIAGNOSIS — M25551 Pain in right hip: Secondary | ICD-10-CM | POA: Diagnosis not present

## 2021-08-27 DIAGNOSIS — M6281 Muscle weakness (generalized): Secondary | ICD-10-CM | POA: Diagnosis not present

## 2021-08-27 DIAGNOSIS — M25551 Pain in right hip: Secondary | ICD-10-CM | POA: Diagnosis not present

## 2021-08-31 DIAGNOSIS — M25551 Pain in right hip: Secondary | ICD-10-CM | POA: Diagnosis not present

## 2021-08-31 DIAGNOSIS — M6281 Muscle weakness (generalized): Secondary | ICD-10-CM | POA: Diagnosis not present

## 2021-09-02 DIAGNOSIS — M6281 Muscle weakness (generalized): Secondary | ICD-10-CM | POA: Diagnosis not present

## 2021-09-02 DIAGNOSIS — M25551 Pain in right hip: Secondary | ICD-10-CM | POA: Diagnosis not present

## 2021-09-07 ENCOUNTER — Encounter: Payer: Self-pay | Admitting: Family Medicine

## 2021-09-07 ENCOUNTER — Other Ambulatory Visit: Payer: Self-pay

## 2021-09-07 DIAGNOSIS — E119 Type 2 diabetes mellitus without complications: Secondary | ICD-10-CM

## 2021-09-07 DIAGNOSIS — M6281 Muscle weakness (generalized): Secondary | ICD-10-CM | POA: Diagnosis not present

## 2021-09-07 DIAGNOSIS — M25551 Pain in right hip: Secondary | ICD-10-CM | POA: Diagnosis not present

## 2021-09-07 MED ORDER — SAXAGLIPTIN HCL 5 MG PO TABS
ORAL_TABLET | ORAL | 6 refills | Status: DC
Start: 2021-09-07 — End: 2022-03-01

## 2021-09-09 DIAGNOSIS — M25551 Pain in right hip: Secondary | ICD-10-CM | POA: Diagnosis not present

## 2021-09-09 DIAGNOSIS — M6281 Muscle weakness (generalized): Secondary | ICD-10-CM | POA: Diagnosis not present

## 2021-09-14 DIAGNOSIS — M6281 Muscle weakness (generalized): Secondary | ICD-10-CM | POA: Diagnosis not present

## 2021-09-14 DIAGNOSIS — M25551 Pain in right hip: Secondary | ICD-10-CM | POA: Diagnosis not present

## 2021-09-16 DIAGNOSIS — M25551 Pain in right hip: Secondary | ICD-10-CM | POA: Diagnosis not present

## 2021-09-16 DIAGNOSIS — M6281 Muscle weakness (generalized): Secondary | ICD-10-CM | POA: Diagnosis not present

## 2021-09-16 DIAGNOSIS — M25451 Effusion, right hip: Secondary | ICD-10-CM | POA: Diagnosis not present

## 2021-09-16 DIAGNOSIS — M25452 Effusion, left hip: Secondary | ICD-10-CM | POA: Diagnosis not present

## 2021-09-20 ENCOUNTER — Encounter: Payer: Self-pay | Admitting: Family Medicine

## 2021-09-20 ENCOUNTER — Other Ambulatory Visit: Payer: Self-pay

## 2021-09-20 DIAGNOSIS — M6281 Muscle weakness (generalized): Secondary | ICD-10-CM | POA: Diagnosis not present

## 2021-09-20 DIAGNOSIS — M25551 Pain in right hip: Secondary | ICD-10-CM | POA: Diagnosis not present

## 2021-09-20 MED ORDER — DICLOFENAC SODIUM 1 % EX GEL: 4.0000 g | Freq: Four times a day (QID) | CUTANEOUS | 3 refills | Status: DC

## 2021-09-22 DIAGNOSIS — M6281 Muscle weakness (generalized): Secondary | ICD-10-CM | POA: Diagnosis not present

## 2021-09-22 DIAGNOSIS — M25551 Pain in right hip: Secondary | ICD-10-CM | POA: Diagnosis not present

## 2021-09-28 DIAGNOSIS — M25551 Pain in right hip: Secondary | ICD-10-CM | POA: Diagnosis not present

## 2021-09-28 DIAGNOSIS — M6281 Muscle weakness (generalized): Secondary | ICD-10-CM | POA: Diagnosis not present

## 2021-09-30 DIAGNOSIS — M6281 Muscle weakness (generalized): Secondary | ICD-10-CM | POA: Diagnosis not present

## 2021-09-30 DIAGNOSIS — M25551 Pain in right hip: Secondary | ICD-10-CM | POA: Diagnosis not present

## 2021-10-05 DIAGNOSIS — M6281 Muscle weakness (generalized): Secondary | ICD-10-CM | POA: Diagnosis not present

## 2021-10-05 DIAGNOSIS — M25551 Pain in right hip: Secondary | ICD-10-CM | POA: Diagnosis not present

## 2021-10-07 DIAGNOSIS — M6281 Muscle weakness (generalized): Secondary | ICD-10-CM | POA: Diagnosis not present

## 2021-10-07 DIAGNOSIS — M25551 Pain in right hip: Secondary | ICD-10-CM | POA: Diagnosis not present

## 2021-10-10 ENCOUNTER — Encounter: Payer: Self-pay | Admitting: Family Medicine

## 2021-10-12 DIAGNOSIS — M6281 Muscle weakness (generalized): Secondary | ICD-10-CM | POA: Diagnosis not present

## 2021-10-12 DIAGNOSIS — M25551 Pain in right hip: Secondary | ICD-10-CM | POA: Diagnosis not present

## 2021-10-15 DIAGNOSIS — M25451 Effusion, right hip: Secondary | ICD-10-CM | POA: Diagnosis not present

## 2021-10-18 DIAGNOSIS — M25551 Pain in right hip: Secondary | ICD-10-CM | POA: Diagnosis not present

## 2021-10-18 DIAGNOSIS — M6281 Muscle weakness (generalized): Secondary | ICD-10-CM | POA: Diagnosis not present

## 2021-10-20 DIAGNOSIS — M25551 Pain in right hip: Secondary | ICD-10-CM | POA: Diagnosis not present

## 2021-10-20 DIAGNOSIS — M6281 Muscle weakness (generalized): Secondary | ICD-10-CM | POA: Diagnosis not present

## 2021-11-04 DIAGNOSIS — M170 Bilateral primary osteoarthritis of knee: Secondary | ICD-10-CM | POA: Diagnosis not present

## 2021-11-04 DIAGNOSIS — M7061 Trochanteric bursitis, right hip: Secondary | ICD-10-CM | POA: Diagnosis not present

## 2021-11-04 DIAGNOSIS — M19042 Primary osteoarthritis, left hand: Secondary | ICD-10-CM | POA: Diagnosis not present

## 2021-11-04 DIAGNOSIS — M19041 Primary osteoarthritis, right hand: Secondary | ICD-10-CM | POA: Diagnosis not present

## 2021-11-11 DIAGNOSIS — M170 Bilateral primary osteoarthritis of knee: Secondary | ICD-10-CM | POA: Diagnosis not present

## 2021-11-15 ENCOUNTER — Other Ambulatory Visit: Payer: Self-pay

## 2021-11-15 ENCOUNTER — Telehealth: Payer: Self-pay | Admitting: Family Medicine

## 2021-11-15 MED ORDER — METFORMIN HCL ER 500 MG PO TB24: 1000.0000 mg | ORAL_TABLET | Freq: Two times a day (BID) | ORAL | 1 refills | Status: DC

## 2021-11-15 NOTE — Telephone Encounter (Signed)
Pt called in asking if we can send a new script of the metformin to the [facility] in [place]   She states that she is out of the script and it is expired   Please advise

## 2021-11-15 NOTE — Telephone Encounter (Signed)
Rx sent 

## 2021-11-18 ENCOUNTER — Telehealth: Payer: Self-pay | Admitting: Family Medicine

## 2021-11-18 DIAGNOSIS — M170 Bilateral primary osteoarthritis of knee: Secondary | ICD-10-CM | POA: Diagnosis not present

## 2021-11-18 NOTE — Telephone Encounter (Signed)
I have placed a clearance form in the bin up front with a charge sheet   Please advise

## 2021-11-19 NOTE — Telephone Encounter (Signed)
Pt has appt on 1/24.  Forms will be completed at that time

## 2021-11-19 NOTE — Telephone Encounter (Signed)
Placed in your folder to be signed

## 2021-11-23 ENCOUNTER — Encounter: Payer: Self-pay | Admitting: Family Medicine

## 2021-11-23 ENCOUNTER — Ambulatory Visit (INDEPENDENT_AMBULATORY_CARE_PROVIDER_SITE_OTHER): Payer: Medicare HMO | Admitting: Family Medicine

## 2021-11-23 VITALS — BP 130/72 | HR 91 | Temp 97.4°F | Resp 16 | Ht 63.0 in | Wt 135.0 lb

## 2021-11-23 DIAGNOSIS — E119 Type 2 diabetes mellitus without complications: Secondary | ICD-10-CM

## 2021-11-23 DIAGNOSIS — Z01818 Encounter for other preprocedural examination: Secondary | ICD-10-CM

## 2021-11-23 LAB — CBC WITH DIFFERENTIAL/PLATELET
Basophils Absolute: 0 10*3/uL (ref 0.0–0.1)
Basophils Relative: 0.5 % (ref 0.0–3.0)
Eosinophils Absolute: 0.3 10*3/uL (ref 0.0–0.7)
Eosinophils Relative: 3.7 % (ref 0.0–5.0)
HCT: 42.3 % (ref 36.0–46.0)
Hemoglobin: 13.9 g/dL (ref 12.0–15.0)
Lymphocytes Relative: 20.3 % (ref 12.0–46.0)
Lymphs Abs: 1.4 10*3/uL (ref 0.7–4.0)
MCHC: 32.8 g/dL (ref 30.0–36.0)
MCV: 88.4 fl (ref 78.0–100.0)
Monocytes Absolute: 0.8 10*3/uL (ref 0.1–1.0)
Monocytes Relative: 10.9 % (ref 3.0–12.0)
Neutro Abs: 4.5 10*3/uL (ref 1.4–7.7)
Neutrophils Relative %: 64.6 % (ref 43.0–77.0)
Platelets: 324 10*3/uL (ref 150.0–400.0)
RBC: 4.78 Mil/uL (ref 3.87–5.11)
RDW: 12.7 % (ref 11.5–15.5)
WBC: 7 10*3/uL (ref 4.0–10.5)

## 2021-11-23 LAB — BASIC METABOLIC PANEL
BUN: 15 mg/dL (ref 6–23)
CO2: 28 mEq/L (ref 19–32)
Calcium: 10.2 mg/dL (ref 8.4–10.5)
Chloride: 95 mEq/L — ABNORMAL LOW (ref 96–112)
Creatinine, Ser: 0.64 mg/dL (ref 0.40–1.20)
GFR: 89.25 mL/min (ref >60.00)
Glucose, Bld: 134 mg/dL — ABNORMAL HIGH (ref 70–99)
Potassium: 4.2 mEq/L (ref 3.5–5.1)
Sodium: 133 mEq/L — ABNORMAL LOW (ref 135–145)

## 2021-11-23 LAB — HEPATIC FUNCTION PANEL
ALT: 16 U/L (ref 0–35)
AST: 16 U/L (ref 0–37)
Albumin: 4.8 g/dL (ref 3.5–5.2)
Alkaline Phosphatase: 75 U/L (ref 39–117)
Bilirubin, Direct: 0.1 mg/dL (ref 0.0–0.3)
Total Bilirubin: 0.6 mg/dL (ref 0.2–1.2)
Total Protein: 7.1 g/dL (ref 6.0–8.3)

## 2021-11-23 LAB — HEMOGLOBIN A1C: Hgb A1c MFr Bld: 6.7 % — ABNORMAL HIGH (ref 4.6–6.5)

## 2021-11-23 LAB — TSH: TSH: 2.59 u[IU]/mL (ref 0.35–5.50)

## 2021-11-23 NOTE — Patient Instructions (Signed)
Follow up in 3-4 months (or as your surgery/recovery allows) We'll notify you of your lab results and make any changes if needed Keep up the good work!  You look great! HOLD your Metformin the night before surgery and the morning of surgery HOLD the Onglyza the morning of surgery Call with any questions or concerns GOOD LUCK!!!

## 2021-11-23 NOTE — Progress Notes (Signed)
Subjective:    Ashley Shaffer is a 71 y.o. female who presents to the office today for a preoperative consultation at the request of surgeon Dr Zachery Dakins who plans on performing R total knee on April 24. This consultation is requested for the specific conditions prompting preoperative evaluation (i.e. because of potential affect on operative risk): DM, hypothyroid. Planned anesthesia: general. The patient has the following known anesthesia issues:  no hx of problems . Patients bleeding risk: no recent abnormal bleeding and no remote history of abnormal bleeding. Patient does not have objections to receiving blood products if needed.  The following portions of the patient's history were reviewed and updated as appropriate: allergies, current medications, past family history, past medical history, past social history, past surgical history, and problem list.  Review of Systems A comprehensive review of systems was negative.    Objective:    BP 130/72    Pulse 91    Temp (!) 97.4 F (36.3 C) (Temporal)    Resp 16    Ht 5\' 3"  (1.6 m)    Wt 135 lb (61.2 kg)    LMP 10/31/2004 (Approximate)    SpO2 95%    BMI 23.91 kg/m   General Appearance:    Alert, cooperative, no distress, appears stated age  Head:    Normocephalic, without obvious abnormality, atraumatic  Eyes:    PERRL, conjunctiva/corneas clear, EOM's intact both eyes  Ears:    Normal TM's and external ear canals, both ears  Nose:   Nares normal, septum midline, mucosa normal, no drainage    or sinus tenderness  Throat:   Lips, mucosa, and tongue normal; teeth and gums normal  Neck:   Supple, symmetrical, trachea midline, no adenopathy;    thyroid:  no enlargement/tenderness/nodules  Back:     Symmetric, no curvature, ROM normal, no CVA tenderness  Lungs:     Clear to auscultation bilaterally, respirations unlabored  Chest Wall:    No tenderness or deformity   Heart:    Regular rate and rhythm, S1 and S2 normal, no murmur, rub   or  gallop  Breast Exam:    Deferred  Abdomen:     Soft, non-tender, bowel sounds active all four quadrants,    no masses, no organomegaly  Genitalia:    Deferred   Rectal:    Extremities:   Extremities normal, atraumatic, no cyanosis or edema  Pulses:   2+ and symmetric all extremities  Skin:   Skin color, texture, turgor normal, no rashes or lesions  Lymph nodes:   Cervical, supraclavicular, and axillary nodes normal  Neurologic:   CNII-XII intact, normal strength, sensation and reflexes    throughout    Predictors of intubation difficulty:  Morbid obesity? no  Anatomically abnormal facies? no  Prominent incisors? no  Receding mandible? no  Short, thick neck? no  Neck range of motion: normal  Dentition: No chipped, loose, or missing teeth.  Cardiographics ECG: normal sinus rhythm, no blocks or conduction defects, no ischemic changes Echocardiogram: not done  Imaging Chest x-ray:  NA    Lab Review  Office Visit on 07/15/2021  Component Date Value   Hgb A1c MFr Bld 07/15/2021 7.7 (H)    Cholesterol 07/15/2021 216 (H)    Triglycerides 07/15/2021 117.0    HDL 07/15/2021 80.50    VLDL 07/15/2021 23.4    LDL Cholesterol 07/15/2021 112 (H)    Total CHOL/HDL Ratio 07/15/2021 3    NonHDL 07/15/2021 135.48    Sodium  07/15/2021 136    Potassium 07/15/2021 4.3    Chloride 07/15/2021 99    CO2 07/15/2021 29    Glucose, Bld 07/15/2021 160 (H)    BUN 07/15/2021 14    Creatinine, Ser 07/15/2021 0.63    GFR 07/15/2021 89.81    Calcium 07/15/2021 9.9    TSH 07/15/2021 3.13    Total Bilirubin 07/15/2021 0.5    Bilirubin, Direct 07/15/2021 0.1    Alkaline Phosphatase 07/15/2021 77    AST 07/15/2021 16    ALT 07/15/2021 17    Total Protein 07/15/2021 6.8    Albumin 07/15/2021 4.5    WBC 07/15/2021 6.2    RBC 07/15/2021 4.57    Hemoglobin 07/15/2021 13.5    HCT 07/15/2021 40.5    MCV 07/15/2021 88.6    MCHC 07/15/2021 33.4    RDW 07/15/2021 12.5    Platelets 07/15/2021 317.0     Neutrophils Relative % 07/15/2021 57.2    Lymphocytes Relative 07/15/2021 25.5    Monocytes Relative 07/15/2021 11.7    Eosinophils Relative 07/15/2021 5.0    Basophils Relative 07/15/2021 0.6    Neutro Abs 07/15/2021 3.5    Lymphs Abs 07/15/2021 1.6    Monocytes Absolute 07/15/2021 0.7    Eosinophils Absolute 07/15/2021 0.3    Basophils Absolute 07/15/2021 0.0    Microalb, Ur 07/15/2021 2.6 (H)    Creatinine,U 07/15/2021 55.8    Microalb Creat Ratio 07/15/2021 4.7   Admission on 06/30/2021, Discharged on 06/30/2021  Component Date Value   Sodium 06/29/2021 132 (L)    Potassium 06/29/2021 4.1    Chloride 06/29/2021 99    CO2 06/29/2021 24    Glucose, Bld 06/29/2021 239 (H)    BUN 06/29/2021 21    Creatinine, Ser 06/29/2021 0.69    Calcium 06/29/2021 9.7    GFR, Estimated 06/29/2021 >60    Anion gap 06/29/2021 9    WBC 06/29/2021 7.9    RBC 06/29/2021 4.33    Hemoglobin 06/29/2021 12.8    HCT 06/29/2021 38.2    MCV 06/29/2021 88.2    MCH 06/29/2021 29.6    MCHC 06/29/2021 33.5    RDW 06/29/2021 11.9    Platelets 06/29/2021 301    nRBC 06/29/2021 0.0    Glucose-Capillary 06/29/2021 238 (H)    SARS Coronavirus 2 by RT* 06/30/2021 NEGATIVE    Influenza A by PCR 06/30/2021 NEGATIVE    Influenza B by PCR 06/30/2021 NEGATIVE    Glucose-Capillary 06/30/2021 192 (H)       Assessment:      71 y.o. female with planned surgery as above.   Known risk factors for perioperative complications: Diabetes mellitus   Difficulty with intubation is not anticipated.  Cardiac Risk Estimation: low  Current medications which may produce withdrawal symptoms if withheld perioperatively: none    Plan:    1. Preoperative workup as follows ECG, hemoglobin, hematocrit, electrolytes, creatinine, glucose, liver function studies. 2. Change in medication regimen before surgery:  hold Metformin and Onglyza the morning of surgery, stop NSAIDs 2 weeks prior . 3. Prophylaxis for cardiac events  with perioperative beta-blockers: not indicated. 4. Invasive hemodynamic monitoring perioperatively: at the discretion of anesthesiologist. 5. Deep vein thrombosis prophylaxis postoperatively:regimen to be chosen by surgical team. 6. Surveillance for postoperative MI with ECG immediately postoperatively and on postoperative days 1 and 2 AND troponin levels 24 hours postoperatively and on day 4 or hospital discharge (whichever comes first): at the discretion of anesthesiologist. 7. Other measures:  none

## 2021-11-29 DIAGNOSIS — M1611 Unilateral primary osteoarthritis, right hip: Secondary | ICD-10-CM | POA: Diagnosis not present

## 2021-12-30 ENCOUNTER — Encounter: Payer: Self-pay | Admitting: Family Medicine

## 2021-12-30 MED ORDER — MAGNESIUM CITRATE PO SOLN: 296.0000 mL | Freq: Once | ORAL | 0 refills | Status: AC

## 2021-12-31 ENCOUNTER — Encounter: Payer: Self-pay | Admitting: Family Medicine

## 2022-01-18 DIAGNOSIS — M1712 Unilateral primary osteoarthritis, left knee: Secondary | ICD-10-CM | POA: Diagnosis not present

## 2022-01-20 DIAGNOSIS — M1611 Unilateral primary osteoarthritis, right hip: Secondary | ICD-10-CM | POA: Diagnosis not present

## 2022-01-31 DIAGNOSIS — M1712 Unilateral primary osteoarthritis, left knee: Secondary | ICD-10-CM | POA: Diagnosis not present

## 2022-02-01 ENCOUNTER — Ambulatory Visit: Payer: Self-pay | Admitting: Physician Assistant

## 2022-02-01 DIAGNOSIS — G8929 Other chronic pain: Secondary | ICD-10-CM

## 2022-02-01 NOTE — H&P (Signed)
TOTAL KNEE ADMISSION H&P ? ?Patient is being admitted for left total knee arthroplasty. ? ?Subjective: ? ?Chief Complaint:left knee pain. ? ?HPI: Ashley Shaffer, 71 y.o. female, has a history of pain and functional disability in the left knee due to arthritis and has failed non-surgical conservative treatments for greater than 12 weeks to includeNSAID's and/or analgesics and activity modification.  Onset of symptoms was gradual, starting 9 years ago with gradually worsening course since that time. The patient noted no past surgery on the left knee(s).  Patient currently rates pain in the left knee(s) at 8 out of 10 with activity. Patient has night pain, worsening of pain with activity and weight bearing, pain that interferes with activities of daily living, pain with passive range of motion, crepitus, and joint swelling.  Patient has evidence of periarticular osteophytes and joint space narrowing by imaging studies. There is no active infection. ? ?Patient Active Problem List  ? Diagnosis Date Noted  ? Arthritis of knee, degenerative 01/12/2021  ? Hyperlipidemia 04/03/2017  ? Retinal scar of left eye 06/29/2016  ? Physical exam 04/01/2016  ? Diabetes mellitus type II, controlled, with no complications (HCC) 01/27/2016  ? Hypothyroid 01/27/2016  ? Depression 01/27/2016  ? Idiopathic scoliosis 01/27/2016  ? ?Past Medical History:  ?Diagnosis Date  ? Basal cell carcinoma   ? Depression   ? Diabetes mellitus without complication (HCC)   ? type II  ? Hyperlipidemia   ? Ischemic colitis (HCC)   ? Left ACL tear   ? Osteopenia   ? Thyroid disease   ? thyroid removed age 26 due to benign mass  ?  ?Past Surgical History:  ?Procedure Laterality Date  ? BREAST BIOPSY Left   ? Benign  ? BREAST MASS EXCISION Right 1998  ? BUNIONECTOMY  1997  ? KNEE ARTHROSCOPY  12/04/07  ? SHOULDER SURGERY Right 08/10/06  ? THYROIDECTOMY  1978  ? due to enlarged goiter  ? TONSILLECTOMY    ? TUBAL LIGATION    ?  ?Current Outpatient Medications   ?Medication Sig Dispense Refill Last Dose  ? ACCU-CHEK SOFTCLIX LANCETS lancets Use one lancet each time sugars are tested. Dx. E11.9 300 each 3   ? Blood Glucose Monitoring Suppl (ACCU-CHEK AVIVA PLUS) w/Device KIT Please use new glucometer to check sugar levels daily. Dx E11.9 1 kit 2   ? buPROPion ER (WELLBUTRIN SR) 100 MG 12 hr tablet Take 1 tablet (100 mg total) by mouth 2 (two) times daily. 180 tablet 1   ? diclofenac Sodium (VOLTAREN) 1 % GEL Apply 4 g topically 4 (four) times daily. 350 g 3   ? glucose blood (ACCU-CHEK AVIVA PLUS) test strip Please use one strip each time sugars are tested. Pt checks sugars three times daily. Dx E11.9 300 each 3   ? levothyroxine (SYNTHROID) 150 MCG tablet TAKE 1 TABLET BY MOUTH EVERY DAY BEFORE BREAKFAST 90 tablet 1   ? meclizine (ANTIVERT) 25 MG tablet Take 1 tablet (25 mg total) by mouth 3 (three) times daily as needed for dizziness. 30 tablet 0   ? metFORMIN (GLUCOPHAGE-XR) 500 MG 24 hr tablet Take 2 tablets (1,000 mg total) by mouth 2 (two) times daily. 360 tablet 1   ? ORTHOVISC 30 MG/2ML SOSY      ? saxagliptin HCl (ONGLYZA) 5 MG TABS tablet TAKE 1 TABLET(5 MG) BY MOUTH DAILY 30 tablet 6   ? ?No current facility-administered medications for this visit.  ? ?No Known Allergies  ?Social History  ? ?  Tobacco Use  ? Smoking status: Never  ? Smokeless tobacco: Never  ?Substance Use Topics  ? Alcohol use: Yes  ?  Alcohol/week: 2.0 standard drinks  ?  Types: 2 Glasses of wine per week  ?  Comment: weekly  ?  ?Family History  ?Problem Relation Age of Onset  ? Heart disease Mother   ? Healthy Sister   ? Heart disease Brother   ? Diabetes Maternal Aunt   ?  ? ?Review of Systems  ?Musculoskeletal:  Positive for arthralgias.  ?All other systems reviewed and are negative. ? ?Objective: ? ?Physical Exam ?Constitutional:   ?   General: She is not in acute distress. ?   Appearance: Normal appearance.  ?HENT:  ?   Head: Normocephalic and atraumatic.  ?Eyes:  ?   Extraocular Movements:  Extraocular movements intact.  ?   Pupils: Pupils are equal, round, and reactive to light.  ?Cardiovascular:  ?   Rate and Rhythm: Normal rate and regular rhythm.  ?   Pulses: Normal pulses.  ?   Heart sounds: Normal heart sounds.  ?Pulmonary:  ?   Effort: Pulmonary effort is normal. No respiratory distress.  ?   Breath sounds: Normal breath sounds. No wheezing.  ?Abdominal:  ?   General: Abdomen is flat. Bowel sounds are normal. There is no distension.  ?   Palpations: Abdomen is soft.  ?   Tenderness: There is no abdominal tenderness.  ?Musculoskeletal:  ?   Cervical back: Normal range of motion and neck supple.  ?   Comments: On examination she has severe bilateral valgus deformities, left greater than right.  Left knee range of motion is 5-120 degrees.  Valgus deformity, not passively correctable.  Her knee is stable to varus and valgus stress.  MCL has a palpable end point.  Negative anterior and posterior drawer.  No pain with hip flexion or internal rotation.  She walks with a moderately antalgic gait.  She has intact sensation distally below the knees.  She has intact ankle dorsiflexion and plantarflexion.  Her feet are warm and well perfused.    ?Lymphadenopathy:  ?   Cervical: No cervical adenopathy.  ?Skin: ?   General: Skin is warm and dry.  ?   Findings: No erythema or rash.  ?Neurological:  ?   General: No focal deficit present.  ?   Mental Status: She is alert and oriented to person, place, and time.  ?Psychiatric:     ?   Mood and Affect: Mood normal.     ?   Behavior: Behavior normal.  ? ? ?Vital signs in last 24 hours: ?@VSRANGES@ ? ?Labs: ? ? ?Estimated body mass index is 23.91 kg/m? as calculated from the following: ?  Height as of 11/23/21: 5' 3" (1.6 m). ?  Weight as of 11/23/21: 61.2 kg. ? ? ?Imaging Review ?Plain radiographs demonstrate severe degenerative joint disease of the left knee(s). The overall alignment issignificant valgus. The bone quality appears to be good for age and reported  activity level. ? ? ? ? ? ?Assessment/Plan: ? ?End stage arthritis, left knee  ? ?The patient history, physical examination, clinical judgment of the provider and imaging studies are consistent with end stage degenerative joint disease of the left knee(s) and total knee arthroplasty is deemed medically necessary. The treatment options including medical management, injection therapy arthroscopy and arthroplasty were discussed at length. The risks and benefits of total knee arthroplasty were presented and reviewed. The risks due to aseptic   loosening, infection, stiffness, patella tracking problems, thromboembolic complications and other imponderables were discussed. The patient acknowledged the explanation, agreed to proceed with the plan and consent was signed. Patient is being admitted for inpatient treatment for surgery, pain control, PT, OT, prophylactic antibiotics, VTE prophylaxis, progressive ambulation and ADL's and discharge planning. The patient is planning to be discharged  home with outpt PT. ? ? ? ?Anticipated LOS equal to or greater than 2 midnights due to ?- Age 65 and older with one or more of the following: ? - Obesity ? - Expected need for hospital services (PT, OT, Nursing) required for safe  discharge ? - Anticipated need for postoperative skilled nursing care or inpatient rehab ? - Active co-morbidities: Diabetes ?OR  ? ?- Unanticipated findings during/Post Surgery: None  ?- Patient is a high risk of re-admission due to: None ?

## 2022-02-01 NOTE — H&P (View-Only) (Signed)
TOTAL KNEE ADMISSION H&P ? ?Patient is being admitted for left total knee arthroplasty. ? ?Subjective: ? ?Chief Complaint:left knee pain. ? ?HPI: Ashley Shaffer, 71 y.o. female, has a history of pain and functional disability in the left knee due to arthritis and has failed non-surgical conservative treatments for greater than 12 weeks to includeNSAID's and/or analgesics and activity modification.  Onset of symptoms was gradual, starting 9 years ago with gradually worsening course since that time. The patient noted no past surgery on the left knee(s).  Patient currently rates pain in the left knee(s) at 8 out of 10 with activity. Patient has night pain, worsening of pain with activity and weight bearing, pain that interferes with activities of daily living, pain with passive range of motion, crepitus, and joint swelling.  Patient has evidence of periarticular osteophytes and joint space narrowing by imaging studies. There is no active infection. ? ?Patient Active Problem List  ? Diagnosis Date Noted  ? Arthritis of knee, degenerative 01/12/2021  ? Hyperlipidemia 04/03/2017  ? Retinal scar of left eye 06/29/2016  ? Physical exam 04/01/2016  ? Diabetes mellitus type II, controlled, with no complications ([place]) 32/67/1245  ? Hypothyroid 01/27/2016  ? Depression 01/27/2016  ? Idiopathic scoliosis 01/27/2016  ? ?Past Medical History:  ?Diagnosis Date  ? Basal cell carcinoma   ? Depression   ? Diabetes mellitus without complication ([place])   ? type II  ? Hyperlipidemia   ? Ischemic colitis ([place])   ? Left ACL tear   ? Osteopenia   ? Thyroid disease   ? thyroid removed age 72 due to benign mass  ?  ?Past Surgical History:  ?Procedure Laterality Date  ? BREAST BIOPSY Left   ? Benign  ? BREAST MASS EXCISION Right 1998  ? [place]  ? KNEE ARTHROSCOPY  12/04/07  ? SHOULDER SURGERY Right 08/10/06  ? THYROIDECTOMY  1978  ? due to enlarged goiter  ? TONSILLECTOMY    ? TUBAL LIGATION    ?  ?Current Outpatient Medications   ?Medication Sig Dispense Refill Last Dose  ? ACCU-CHEK SOFTCLIX LANCETS lancets Use one lancet each time sugars are tested. Dx. E11.9 300 each 3   ? Blood Glucose Monitoring Suppl (ACCU-CHEK AVIVA PLUS) w/Device KIT Please use new glucometer to check sugar levels daily. Dx E11.9 1 kit 2   ? buPROPion ER (WELLBUTRIN SR) 100 MG 12 hr tablet Take 1 tablet (100 mg total) by mouth 2 (two) times daily. 180 tablet 1   ? diclofenac Sodium (VOLTAREN) 1 % GEL Apply 4 g topically 4 (four) times daily. 350 g 3   ? glucose blood (ACCU-CHEK AVIVA PLUS) test strip Please use one strip each time sugars are tested. Pt checks sugars three times daily. Dx E11.9 300 each 3   ? levothyroxine (SYNTHROID) 150 MCG tablet TAKE 1 TABLET BY MOUTH EVERY DAY BEFORE BREAKFAST 90 tablet 1   ? meclizine (ANTIVERT) 25 MG tablet Take 1 tablet (25 mg total) by mouth 3 (three) times daily as needed for dizziness. 30 tablet 0   ? metFORMIN (GLUCOPHAGE-XR) 500 MG 24 hr tablet Take 2 tablets (1,000 mg total) by mouth 2 (two) times daily. 360 tablet 1   ? ORTHOVISC 30 MG/2ML SOSY      ? saxagliptin HCl (ONGLYZA) 5 MG TABS tablet TAKE 1 TABLET(5 MG) BY MOUTH DAILY 30 tablet 6   ? ?No current facility-administered medications for this visit.  ? ?No Known Allergies  ?Social History  ? ?  Tobacco Use  ? Smoking status: Never  ? Smokeless tobacco: Never  ?Substance Use Topics  ? Alcohol use: Yes  ?  Alcohol/week: 2.0 standard drinks  ?  Types: 2 Glasses of wine per week  ?  Comment: weekly  ?  ?Family History  ?Problem Relation Age of Onset  ? Heart disease Mother   ? Healthy Sister   ? Heart disease Brother   ? Diabetes Maternal Aunt   ?  ? ?Review of Systems  ?Musculoskeletal:  Positive for arthralgias.  ?All other systems reviewed and are negative. ? ?Objective: ? ?Physical Exam ?Constitutional:   ?   General: She is not in acute distress. ?   Appearance: Normal appearance.  ?HENT:  ?   Head: Normocephalic and atraumatic.  ?Eyes:  ?   Extraocular Movements:  Extraocular movements intact.  ?   Pupils: Pupils are equal, round, and reactive to light.  ?Cardiovascular:  ?   Rate and Rhythm: Normal rate and regular rhythm.  ?   Pulses: Normal pulses.  ?   Heart sounds: Normal heart sounds.  ?Pulmonary:  ?   Effort: Pulmonary effort is normal. No respiratory distress.  ?   Breath sounds: Normal breath sounds. No wheezing.  ?Abdominal:  ?   General: Abdomen is flat. Bowel sounds are normal. There is no distension.  ?   Palpations: Abdomen is soft.  ?   Tenderness: There is no abdominal tenderness.  ?Musculoskeletal:  ?   Cervical back: Normal range of motion and neck supple.  ?   Comments: On examination she has severe bilateral valgus deformities, left greater than right.  Left knee range of motion is 5-120 degrees.  Valgus deformity, not passively correctable.  Her knee is stable to varus and valgus stress.  MCL has a palpable end point.  Negative anterior and posterior drawer.  No pain with hip flexion or internal rotation.  She walks with a moderately antalgic gait.  She has intact sensation distally below the knees.  She has intact ankle dorsiflexion and plantarflexion.  Her feet are warm and well perfused.    ?Lymphadenopathy:  ?   Cervical: No cervical adenopathy.  ?Skin: ?   General: Skin is warm and dry.  ?   Findings: No erythema or rash.  ?Neurological:  ?   General: No focal deficit present.  ?   Mental Status: She is alert and oriented to person, place, and time.  ?Psychiatric:     ?   Mood and Affect: Mood normal.     ?   Behavior: Behavior normal.  ? ? ?Vital signs in last 24 hours: ?_0 @ ? ?Labs: ? ? ?Estimated body mass index is 23.91 kg/m? as calculated from the following: ?  Height as of 11/23/21: _1  (1.6 m). ?  Weight as of 11/23/21: 61.2 kg. ? ? ?Imaging Review ?Plain radiographs demonstrate severe degenerative joint disease of the left knee(s). The overall alignment issignificant valgus. The bone quality appears to be good for age and reported  activity level. ? ? ? ? ? ?Assessment/Plan: ? ?End stage arthritis, left knee  ? ?The patient history, physical examination, clinical judgment of the provider and imaging studies are consistent with end stage degenerative joint disease of the left knee(s) and total knee arthroplasty is deemed medically necessary. The treatment options including medical management, injection therapy arthroscopy and arthroplasty were discussed at length. The risks and benefits of total knee arthroplasty were presented and reviewed. The risks due to aseptic  loosening, infection, stiffness, patella tracking problems, thromboembolic complications and other imponderables were discussed. The patient acknowledged the explanation, agreed to proceed with the plan and consent was signed. Patient is being admitted for inpatient treatment for surgery, pain control, PT, OT, prophylactic antibiotics, VTE prophylaxis, progressive ambulation and ADL's and discharge planning. The patient is planning to be discharged  home with outpt PT. ? ? ? ?Anticipated LOS equal to or greater than 2 midnights due to ?- Age 6 and older with one or more of the following: ? - Obesity ? - Expected need for hospital services (PT, OT, Nursing) required for safe  discharge ? - Anticipated need for postoperative skilled nursing care or inpatient rehab ? - Active co-morbidities: Diabetes ?OR  ? ?- Unanticipated findings during/Post Surgery: None  ?- Patient is a high risk of re-admission due to: None ?

## 2022-02-04 ENCOUNTER — Other Ambulatory Visit: Payer: Self-pay | Admitting: Family Medicine

## 2022-02-04 ENCOUNTER — Encounter: Payer: Self-pay | Admitting: Family Medicine

## 2022-02-07 NOTE — Progress Notes (Signed)
Anesthesia Review: ? ?PCP: Annye Asa- LOV 11/23/21 for preop exam  ?Cardiologist : ?Chest x-ray : ?EKG : 11/23/21  ?Echo : ?Stress test: ?Cardiac Cath :  ?Activity level:  ?Sleep Study/ CPAP : ?Fasting Blood Sugar :      / Checks Blood Sugar -- times a day:   ?Blood Thinner/ Instructions /Last Dose: ?ASA / Instructions/ Last Dose :   ?DM- type ?Hgba1c-  ?

## 2022-02-07 NOTE — Progress Notes (Signed)
DUE TO COVID-19 ONLY  two VISITOR IS ALLOWED TO COME WITH YOU AND STAY IN THE WAITING ROOM ONLY DURING PRE OP AND PROCEDURE DAY OF SURGERY.   4  VISITORs  MAY VISIT WITH YOU AFTER SURGERY IN YOUR PRIVATE ROOM DURING VISITING HOURS ONLY! ?YOU MAY HAVE ONE PERSON SPEND THE NITE WITH YOU IN YOUR ROOM AFTER SURGERY.   ? ?Your procedure is scheduled on:  ? 02/21/22  ? Report to [facility] Main  Entrance ? ? Report to admitting at        0515         AM ?DO NOT [place], PICTURE ID OR WALLET DAY OF SURGERY.  ?  ? ? Call this number if you have problems the morning of surgery [phone]  ? ? REMEMBER: NO  SOLID FOODS , CANDY, GUM OR MINTS AFTER MIDNITE THE NITE BEFORE SURGERY .       Marland Kitchen CLEAR LIQUIDS UNTIL    0430am             DAY OF SURGERY.      PLEASE FINISH  G2 Lower sugar  DRINK PER SURGEON ORDER  WHICH NEEDS TO BE COMPLETED AT         MORNING OF SURGERY.   ? ? ? ? ?CLEAR LIQUID DIET ? ? ?Foods Allowed      ?WATER ?BLACK COFFEE ( SUGAR OK, NO MILK, CREAM OR CREAMER) REGULAR AND DECAF  ?TEA ( SUGAR OK NO MILK, CREAM, OR CREAMER) REGULAR AND DECAF  ?PLAIN JELLO ( NO RED)  ?FRUIT ICES ( NO RED, NO FRUIT PULP)  ?POPSICLES ( NO RED)  ?JUICE- APPLE, WHITE GRAPE AND WHITE CRANBERRY  ?SPORT DRINK LIKE GATORADE ( NO RED)  ?CLEAR BROTH ( VEGETABLE , CHICKEN OR BEEF)                                                               ? ?    ? ?BRUSH YOUR TEETH MORNING OF SURGERY AND RINSE YOUR MOUTH OUT, NO CHEWING GUM CANDY OR MINTS. ?  ? ? Take these medicines the morning of surgery with A SIP OF WATER:  allegra, wellbutrin, synthroid  ? ? ?DO NOT TAKE ANY DIABETIC MEDICATIONS DAY OF YOUR SURGERY ?                  ?            You may not have any metal on your body including hair pins and  ?            piercings  Do not wear jewelry, make-up, lotions, powders or perfumes, deodorant ?            Do not wear nail polish on your fingernails.   ?           IF YOU ARE A FEMALE AND WANT TO SHAVE UNDER ARMS OR LEGS  PRIOR TO SURGERY YOU MUST DO SO AT LEAST 48 HOURS PRIOR TO SURGERY.  ?            Men may shave face and neck. ? ? Do not bring valuables to the hospital. [facility] NOT ?  RESPONSIBLE   FOR VALUABLES. ? Contacts, dentures or bridgework may not be worn into surgery. ? Leave suitcase in the car. After surgery it may be brought to your room. ? ?  ? Patients discharged the day of surgery will not be allowed to drive home. IF YOU ARE HAVING SURGERY AND GOING HOME THE SAME DAY, YOU MUST HAVE AN ADULT TO DRIVE YOU HOME AND BE WITH YOU FOR 24 HOURS. YOU MAY GO HOME BY TAXI OR UBER OR ORTHERWISE, BUT AN ADULT MUST ACCOMPANY YOU HOME AND STAY WITH YOU FOR 24 HOURS. ?  ? ?            Please read over the following fact sheets you were given: ?_____________________________________________________________________ ? ?[facility] - Preparing for Surgery ?Before surgery, you can play an important role.  Because skin is not sterile, your skin needs to be as free of germs as possible.  You can reduce the number of germs on your skin by washing with CHG (chlorahexidine gluconate) soap before surgery.  CHG is an antiseptic cleaner which kills germs and bonds with the skin to continue killing germs even after washing. ?Please DO NOT use if you have an allergy to CHG or antibacterial soaps.  If your skin becomes reddened/irritated stop using the CHG and inform your nurse when you arrive at Short Stay. ?Do not shave (including legs and underarms) for at least 48 hours prior to the first CHG shower.  You may shave your face/neck. ?Please follow these instructions carefully: ? 1.  Shower with CHG Soap the night before surgery and the  morning of Surgery. ? 2.  If you choose to wash your hair, wash your hair first as usual with your  normal  shampoo. ? 3.  After you shampoo, rinse your hair and body thoroughly to remove the  shampoo.                           4.  Use CHG as you would any other liquid soap.  You can apply chg  directly  to the skin and wash  ?                     Gently with a scrungie or clean washcloth. ? 5.  Apply the CHG Soap to your body ONLY FROM THE NECK DOWN.   Do not use on face/ open      ?                     Wound or open sores. Avoid contact with eyes, ears mouth and genitals (private parts).  ?                     Production manager,  Genitals (private parts) with your normal soap. ?            6.  Wash thoroughly, paying special attention to the area where your surgery  will be performed. ? 7.  Thoroughly rinse your body with warm water from the neck down. ? 8.  DO NOT shower/wash with your normal soap after using and rinsing off  the CHG Soap. ?               9.  Pat yourself dry with a clean towel. ?           10.  Wear clean pajamas. ?  11.  Place clean sheets on your bed the night of your first shower and do not  sleep with pets. ?Day of Surgery : ?Do not apply any lotions/deodorants the morning of surgery.  Please wear clean clothes to the hospital/surgery center. ? ?FAILURE TO FOLLOW THESE INSTRUCTIONS MAY RESULT IN THE CANCELLATION OF YOUR SURGERY ?PATIENT SIGNATURE_________________________________ ? ?NURSE SIGNATURE__________________________________ ? ?________________________________________________________________________  ? ? ?           ?

## 2022-02-08 ENCOUNTER — Encounter (HOSPITAL_COMMUNITY)
Admission: RE | Admit: 2022-02-08 | Discharge: 2022-02-08 | Disposition: A | Discharge status: Home / Self Care | Payer: Medicare HMO | Source: Ambulatory Visit | Attending: Anesthesiology | Admitting: Anesthesiology

## 2022-02-08 ENCOUNTER — Encounter (HOSPITAL_COMMUNITY): Payer: Self-pay

## 2022-02-08 DIAGNOSIS — Z01818 Encounter for other preprocedural examination: Secondary | ICD-10-CM

## 2022-02-11 NOTE — Patient Instructions (Addendum)
DUE TO COVID-19 ONLY ONE VISITOR  (aged 71 and older)  IS ALLOWED TO COME WITH YOU AND STAY IN THE WAITING ROOM ONLY DURING PRE OP AND PROCEDURE.   ?**NO VISITORS ARE ALLOWED IN THE SHORT STAY AREA OR RECOVERY ROOM!!**  ? ? Your procedure is scheduled on: 02/21/22 ? ? Report to [facility] Main Entrance ? ?  Report to admitting at 5:15 AM ? ? Call this number if you have problems the morning of surgery [phone] ? ? Do not eat food :After Midnight. ? ? After Midnight you may have the following liquids until 4:30 AM DAY OF SURGERY ? ?Water ?Black Coffee (sugar ok, NO MILK/CREAM OR CREAMERS)  ?Tea (sugar ok, NO MILK/CREAM OR CREAMERS) regular and decaf                             ?Plain Jell-O (NO RED)                                           ?Fruit ices (not with fruit pulp, NO RED)                                     ?Popsicles (NO RED)                                                                  ?Juice: apple, WHITE grape, WHITE cranberry ?Sports drinks like Gatorade (NO RED) ?Clear broth(vegetable,chicken,beef) ? ?              ?The day of surgery:  ?Drink ONE (1) Pre-Surgery G2 at 4:30 AM the morning of surgery. Drink in one sitting. Do not sip.  ?This drink was given to you during your hospital  ?pre-op appointment visit. ?Nothing else to drink after completing the  ?Pre-Surgery G2. ?  ?       If you have questions, please contact your surgeon?s office. ? ? ?FOLLOW BOWEL PREP AND ANY ADDITIONAL PRE OP INSTRUCTIONS YOU RECEIVED FROM YOUR SURGEON'S OFFICE!!! ?  ?  ?Oral Hygiene is also important to reduce your risk of infection.                                    ?Remember - BRUSH YOUR TEETH THE MORNING OF SURGERY WITH YOUR REGULAR TOOTHPASTE ? ? Take these medicines the morning of surgery with A SIP OF WATER: Wellbutrin, Allegra, Levothyroxine.  ? ?DO NOT TAKE ANY ORAL DIABETIC MEDICATIONS DAY OF YOUR SURGERY ? ?How to Manage Your Diabetes ?Before and After Surgery ? ?Why is it important to  control my blood sugar before and after surgery? ?Improving blood sugar levels before and after surgery helps healing and can limit problems. ?A way of improving blood sugar control is eating a healthy diet by: ? Eating less sugar and carbohydrates ? Increasing activity/exercise ? Talking with your doctor about reaching your blood sugar goals ?High blood sugars (greater than 180 mg/dL)  can raise your risk of infections and slow your recovery, so you will need to focus on controlling your diabetes during the weeks before surgery. ?Make sure that the doctor who takes care of your diabetes knows about your planned surgery including the date and location. ? ?How do I manage my blood sugar before surgery? ?Check your blood sugar at least 4 times a day, starting 2 days before surgery, to make sure that the level is not too high or low. ?Check your blood sugar the morning of your surgery when you wake up and every 2 hours until you get to the Short Stay unit. ?If your blood sugar is less than 70 mg/dL, you will need to treat for low blood sugar: ?Do not take insulin. ?Treat a low blood sugar (less than 70 mg/dL) with ? cup of clear juice (cranberry or apple), 4 glucose tablets, OR glucose gel. ?Recheck blood sugar in 15 minutes after treatment (to make sure it is greater than 70 mg/dL). If your blood sugar is not greater than 70 mg/dL on recheck, call [phone] for further instructions. ?Report your blood sugar to the short stay nurse when you get to Short Stay. ? ?If you are admitted to the hospital after surgery: ?Your blood sugar will be checked by the staff and you will probably be given insulin after surgery (instead of oral diabetes medicines) to make sure you have good blood sugar levels. ?The goal for blood sugar control after surgery is 80-180 mg/dL. ? ? ?WHAT DO I DO ABOUT MY DIABETES MEDICATION? ? ?Do not take oral diabetes medicines (pills) the morning of surgery. ? ?THE DAY BEFORE SURGERY, take Metformin as  prescribed. Do not take Saxagliptin.      ? ? ?THE MORNING OF SURGERY, do not take Metformin or Saxagliptin.  ? ?Reviewed and Endorsed by [facility] Patient Education Committee, August 2015  ?                  ?           You may not have any metal on your body including hair pins, jewelry, and body piercing ? ?           Do not wear make-up, lotions, powders, perfumes, or deodorant ? ?Do not wear nail polish including gel and S&S, artificial/acrylic nails, or any other type of covering on natural nails including finger and toenails. If you have artificial nails, gel coating, etc. that needs to be removed by a nail salon please have this removed prior to surgery or surgery may need to be canceled/ delayed if the surgeon/ anesthesia feels like they are unable to be safely monitored.  ? ?Do not shave  48 hours prior to surgery.  ? ? Do not bring valuables to the hospital. [facility] NOT ?            RESPONSIBLE   FOR VALUABLES. ?  ? Patients discharged on the day of surgery will not be allowed to drive home.  Someone NEEDS to stay with you for the first 24 hours after anesthesia. ? ? Special Instructions: Bring a copy of your healthcare power of attorney and living will documents         the day of surgery if you haven't scanned them before. ? ?            Please read over the following fact sheets you were given: IF YOU HAVE QUESTIONS ABOUT YOUR PRE-OP INSTRUCTIONS PLEASE CALL  [phone] Schneiders  ? ?   [facility] - Preparing for Surgery ?Before surgery, you can play an important role.  Because skin is not sterile, your skin needs to be as free of germs as possible.  You can reduce the number of germs on your skin by washing with CHG (chlorahexidine gluconate) soap before surgery.  CHG is an antiseptic cleaner which kills germs and bonds with the skin to continue killing germs even after washing. ?Please DO NOT use if you have an allergy to CHG or antibacterial soaps.  If your skin becomes reddened/irritated  stop using the CHG and inform your nurse when you arrive at Short Stay. ?Do not shave (including legs and underarms) for at least 48 hours prior to the first CHG shower.  You may shave your face/neck. ? ?Please follow these instructions carefully: ? 1.  Shower with CHG Soap the night before surgery and the  morning of surgery. ? 2.  If you choose to wash your hair, wash your hair first as usual with your normal  shampoo. ? 3.  After you shampoo, rinse your hair and body thoroughly to remove the shampoo.                            ? 4.  Use CHG as you would any other liquid soap.  You can apply chg directly to the skin and wash.  Gently with a scrungie or clean washcloth. ? 5.  Apply the CHG Soap to your body ONLY FROM THE NECK DOWN.   Do   not use on face/ open      ?                     Wound or open sores. Avoid contact with eyes, ears mouth and   genitals (private parts).  ?                     Production manager,  Genitals (private parts) with your normal soap. ?            6.  Wash thoroughly, paying special attention to the area where your    surgery  will be performed. ? 7.  Thoroughly rinse your body with warm water from the neck down. ? 8.  DO NOT shower/wash with your normal soap after using and rinsing off the CHG Soap. ?               9.  Pat yourself dry with a clean towel. ?           10.  Wear clean pajamas. ?           11.  Place clean sheets on your bed the night of your first shower and do not  sleep with pets. ?Day of Surgery : ?Do not apply any lotions/deodorants the morning of surgery.  Please wear clean clothes to the hospital/surgery center. ? ?FAILURE TO FOLLOW THESE INSTRUCTIONS MAY RESULT IN THE CANCELLATION OF YOUR SURGERY ? ?PATIENT SIGNATURE_________________________________ ? ?NURSE SIGNATURE__________________________________ ? ?________________________________________________________________________  ? ?Incentive Spirometer ? ?An incentive spirometer is a tool that can help keep your lungs clear  and active. This tool measures how well you are filling your lungs with each breath. Taking long deep breaths may help reverse or decrease the chance of developing breathing (pulmonary) problems (especiall

## 2022-02-11 NOTE — Progress Notes (Addendum)
COVID Vaccine Completed: yes x3 ?Date COVID Vaccine completed: 11/21/19, 12/12/19 ?Has received booster: 02/23/21 ?COVID vaccine manufacturer: Pfizer     ? ?Date of COVID positive in last 90 days: no ? ?PCP - Annye Asa, MD ?Cardiologist - n/a ? ?Medical clearance by Annye Asa 11/23/21 Epic ? ?Chest x-ray - n/a ?EKG - 11/23/21 Epic ?Stress Test - n/a ?ECHO - n/a ?Cardiac Cath - n/a ?Pacemaker/ICD device last checked: bn/a ?Spinal Cord Stimulator: n/a ? ?Bowel Prep - no ? ?Sleep Study - n/a ?CPAP -  ? ?Fasting Blood Sugar - 100-130 ?Checks Blood Sugar 1 times a day ? ?Blood Thinner Instructions: n/a ?Aspirin Instructions: ?Last Dose: ? ?Activity level: Can go up a flight of stairs and perform activities of daily living without stopping and without symptoms of chest pain or shortness of breath.   ? ?Anesthesia review:  ? ?Patient denies shortness of breath, fever, cough and chest pain at PAT appointment ? ? ?Patient verbalized understanding of instructions that were given to them at the PAT appointment. Patient was also instructed that they will need to review over the PAT instructions again at home before surgery.  ?

## 2022-02-14 ENCOUNTER — Encounter (HOSPITAL_COMMUNITY): Payer: Self-pay

## 2022-02-14 ENCOUNTER — Encounter (HOSPITAL_COMMUNITY)
Admission: RE | Admit: 2022-02-14 | Discharge: 2022-02-14 | Disposition: A | Discharge status: Home / Self Care | Payer: Medicare HMO | Source: Ambulatory Visit | Attending: Orthopedic Surgery | Admitting: Orthopedic Surgery

## 2022-02-14 DIAGNOSIS — M25562 Pain in left knee: Secondary | ICD-10-CM | POA: Insufficient documentation

## 2022-02-14 DIAGNOSIS — Z01812 Encounter for preprocedural laboratory examination: Secondary | ICD-10-CM | POA: Diagnosis not present

## 2022-02-14 DIAGNOSIS — Z01818 Encounter for other preprocedural examination: Secondary | ICD-10-CM

## 2022-02-14 DIAGNOSIS — G8929 Other chronic pain: Secondary | ICD-10-CM

## 2022-02-14 HISTORY — DX: Unspecified osteoarthritis, unspecified site: M19.90

## 2022-02-14 HISTORY — DX: Dizziness and giddiness: R42

## 2022-02-14 LAB — CBC WITH DIFFERENTIAL/PLATELET
Abs Immature Granulocytes: 0.02 10*3/uL (ref 0.00–0.07)
Basophils Absolute: 0 10*3/uL (ref 0.0–0.1)
Basophils Relative: 1 %
Eosinophils Absolute: 0.3 10*3/uL (ref 0.0–0.5)
Eosinophils Relative: 4 %
HCT: 39.4 % (ref 36.0–46.0)
Hemoglobin: 13.1 g/dL (ref 12.0–15.0)
Immature Granulocytes: 0 %
Lymphocytes Relative: 24 %
Lymphs Abs: 1.5 10*3/uL (ref 0.7–4.0)
MCH: 30.3 pg (ref 26.0–34.0)
MCHC: 33.2 g/dL (ref 30.0–36.0)
MCV: 91.2 fL (ref 80.0–100.0)
Monocytes Absolute: 0.8 10*3/uL (ref 0.1–1.0)
Monocytes Relative: 12 %
Neutro Abs: 3.9 10*3/uL (ref 1.7–7.7)
Neutrophils Relative %: 59 %
Platelets: 299 10*3/uL (ref 150–400)
RBC: 4.32 MIL/uL (ref 3.87–5.11)
RDW: 12.5 % (ref 11.5–15.5)
WBC: 6.5 10*3/uL (ref 4.0–10.5)
nRBC: 0 % (ref 0.0–0.2)

## 2022-02-14 LAB — COMPREHENSIVE METABOLIC PANEL
ALT: 27 U/L (ref 0–44)
AST: 23 U/L (ref 15–41)
Albumin: 4 g/dL (ref 3.5–5.0)
Alkaline Phosphatase: 76 U/L (ref 38–126)
Anion gap: 7 (ref 5–15)
BUN: 24 mg/dL — ABNORMAL HIGH (ref 8–23)
CO2: 26 mmol/L (ref 22–32)
Calcium: 9.6 mg/dL (ref 8.9–10.3)
Chloride: 101 mmol/L (ref 98–111)
Creatinine, Ser: 0.56 mg/dL (ref 0.44–1.00)
GFR, Estimated: >60 mL/min (ref >60)
Glucose, Bld: 158 mg/dL — ABNORMAL HIGH (ref 70–99)
Potassium: 4 mmol/L (ref 3.5–5.1)
Sodium: 134 mmol/L — ABNORMAL LOW (ref 135–145)
Total Bilirubin: 0.6 mg/dL (ref 0.3–1.2)
Total Protein: 6.7 g/dL (ref 6.5–8.1)

## 2022-02-14 LAB — SURGICAL PCR SCREEN
MRSA, PCR: NEGATIVE
Staphylococcus aureus: NEGATIVE

## 2022-02-14 LAB — HEMOGLOBIN A1C
Hgb A1c MFr Bld: 6.5 % — ABNORMAL HIGH (ref 4.8–5.6)
Mean Plasma Glucose: 139.85 mg/dL

## 2022-02-14 LAB — GLUCOSE, CAPILLARY: Glucose-Capillary: 173 mg/dL — ABNORMAL HIGH (ref 70–99)

## 2022-02-15 NOTE — Care Plan (Signed)
Ortho Bundle Case Management Note ? ?Patient Details  ?Name: Ashley Shaffer ?MRN: [id number] ?Date of Birth: 1951-05-19 ? ?Met with patient in the office prior to surgery. She will discharge to home with family to assist. Rolling walker and CPM ordered. OPPT set up with [facility]. Patient and MD in agreement with plan. CHoice offered.  ?                ? ? ? ?DME Arranged:  CPM, Walker rolling ?DME Agency:  [employer] ? ?HH Arranged:    ?[place] Agency:    ? ?Additional Comments: ?Please contact me with any questions of if this plan should need to change. ? ?Mardelle Matte  [facility] Orthopaedic Specialist  [phone] ?02/15/2022, 11:30 AM ?  ?

## 2022-02-20 MED ORDER — TRANEXAMIC ACID 1000 MG/10ML IV SOLN
2000.0000 mg | INTRAVENOUS | Status: DC
  Filled 2022-02-20: qty 20

## 2022-02-21 ENCOUNTER — Encounter (HOSPITAL_COMMUNITY): Payer: Self-pay | Admitting: Orthopedic Surgery

## 2022-02-21 ENCOUNTER — Ambulatory Visit (HOSPITAL_COMMUNITY): Payer: Medicare HMO | Admitting: Certified Registered"

## 2022-02-21 ENCOUNTER — Ambulatory Visit (HOSPITAL_BASED_OUTPATIENT_CLINIC_OR_DEPARTMENT_OTHER): Payer: Medicare HMO | Admitting: Certified Registered"

## 2022-02-21 ENCOUNTER — Ambulatory Visit (HOSPITAL_COMMUNITY)
Admission: RE | Admit: 2022-02-21 | Discharge: 2022-02-21 | Disposition: A | Discharge status: Home / Self Care | Payer: Medicare HMO | Source: Ambulatory Visit | Attending: Orthopedic Surgery | Admitting: Orthopedic Surgery

## 2022-02-21 ENCOUNTER — Encounter (HOSPITAL_COMMUNITY)
Admission: RE | Disposition: A | Discharge status: Home / Self Care | Payer: Self-pay | Source: Ambulatory Visit | Attending: Orthopedic Surgery

## 2022-02-21 ENCOUNTER — Other Ambulatory Visit: Payer: Self-pay

## 2022-02-21 ENCOUNTER — Ambulatory Visit (HOSPITAL_COMMUNITY): Payer: Medicare HMO

## 2022-02-21 ENCOUNTER — Other Ambulatory Visit (HOSPITAL_COMMUNITY): Payer: Self-pay

## 2022-02-21 DIAGNOSIS — M1712 Unilateral primary osteoarthritis, left knee: Secondary | ICD-10-CM | POA: Insufficient documentation

## 2022-02-21 DIAGNOSIS — M4120 Other idiopathic scoliosis, site unspecified: Secondary | ICD-10-CM | POA: Insufficient documentation

## 2022-02-21 DIAGNOSIS — Z833 Family history of diabetes mellitus: Secondary | ICD-10-CM | POA: Insufficient documentation

## 2022-02-21 DIAGNOSIS — Z7984 Long term (current) use of oral hypoglycemic drugs: Secondary | ICD-10-CM | POA: Diagnosis not present

## 2022-02-21 DIAGNOSIS — E119 Type 2 diabetes mellitus without complications: Secondary | ICD-10-CM | POA: Diagnosis not present

## 2022-02-21 DIAGNOSIS — Z6823 Body mass index (BMI) 23.0-23.9, adult: Secondary | ICD-10-CM | POA: Diagnosis not present

## 2022-02-21 DIAGNOSIS — M25762 Osteophyte, left knee: Secondary | ICD-10-CM | POA: Insufficient documentation

## 2022-02-21 DIAGNOSIS — Z8249 Family history of ischemic heart disease and other diseases of the circulatory system: Secondary | ICD-10-CM | POA: Diagnosis not present

## 2022-02-21 DIAGNOSIS — E785 Hyperlipidemia, unspecified: Secondary | ICD-10-CM | POA: Diagnosis not present

## 2022-02-21 DIAGNOSIS — M21062 Valgus deformity, not elsewhere classified, left knee: Secondary | ICD-10-CM | POA: Insufficient documentation

## 2022-02-21 DIAGNOSIS — Z791 Long term (current) use of non-steroidal anti-inflammatories (NSAID): Secondary | ICD-10-CM | POA: Diagnosis not present

## 2022-02-21 DIAGNOSIS — G8918 Other acute postprocedural pain: Secondary | ICD-10-CM | POA: Diagnosis not present

## 2022-02-21 DIAGNOSIS — R42 Dizziness and giddiness: Secondary | ICD-10-CM | POA: Insufficient documentation

## 2022-02-21 DIAGNOSIS — E669 Obesity, unspecified: Secondary | ICD-10-CM | POA: Diagnosis not present

## 2022-02-21 DIAGNOSIS — R262 Difficulty in walking, not elsewhere classified: Secondary | ICD-10-CM | POA: Insufficient documentation

## 2022-02-21 DIAGNOSIS — E039 Hypothyroidism, unspecified: Secondary | ICD-10-CM | POA: Diagnosis not present

## 2022-02-21 DIAGNOSIS — M25462 Effusion, left knee: Secondary | ICD-10-CM | POA: Insufficient documentation

## 2022-02-21 DIAGNOSIS — M25562 Pain in left knee: Secondary | ICD-10-CM | POA: Diagnosis not present

## 2022-02-21 DIAGNOSIS — Z96652 Presence of left artificial knee joint: Secondary | ICD-10-CM | POA: Diagnosis not present

## 2022-02-21 DIAGNOSIS — F32A Depression, unspecified: Secondary | ICD-10-CM | POA: Diagnosis not present

## 2022-02-21 HISTORY — PX: TOTAL KNEE ARTHROPLASTY: SHX125

## 2022-02-21 HISTORY — PX: REPLACEMENT TOTAL KNEE: SUR1224

## 2022-02-21 LAB — GLUCOSE, CAPILLARY
Glucose-Capillary: 116 mg/dL — ABNORMAL HIGH (ref 70–99)
Glucose-Capillary: 164 mg/dL — ABNORMAL HIGH (ref 70–99)

## 2022-02-21 LAB — TYPE AND SCREEN
ABO/RH(D): B POS
Antibody Screen: NEGATIVE

## 2022-02-21 SURGERY — ARTHROPLASTY, KNEE, TOTAL
Anesthesia: Regional | Site: Knee | Laterality: Left

## 2022-02-21 MED ORDER — PROPOFOL 500 MG/50ML IV EMUL
INTRAVENOUS | Status: DC | PRN
  Administered 2022-02-21: 70 ug/kg/min via INTRAVENOUS

## 2022-02-21 MED ORDER — MIDAZOLAM HCL 2 MG/2ML IJ SOLN
INTRAMUSCULAR | Status: AC
  Filled 2022-02-21: qty 2

## 2022-02-21 MED ORDER — DEXAMETHASONE SODIUM PHOSPHATE 10 MG/ML IJ SOLN
INTRAMUSCULAR | Status: AC
  Filled 2022-02-21: qty 1

## 2022-02-21 MED ORDER — OXYCODONE HCL 5 MG PO TABS
ORAL_TABLET | ORAL | Status: AC
  Filled 2022-02-21: qty 1

## 2022-02-21 MED ORDER — BUPIVACAINE-EPINEPHRINE (PF) 0.5% -1:200000 IJ SOLN
INTRAMUSCULAR | Status: DC | PRN
  Administered 2022-02-21: 30 mL via PERINEURAL

## 2022-02-21 MED ORDER — ORAL CARE MOUTH RINSE: 15.0000 mL | Freq: Once | OROMUCOSAL | Status: AC

## 2022-02-21 MED ORDER — ONDANSETRON HCL 4 MG PO TABS
4.0000 mg | ORAL_TABLET | Freq: Four times a day (QID) | ORAL | Status: DC | PRN
Start: 2022-02-21 — End: 2022-02-21
  Filled 2022-02-21: qty 1

## 2022-02-21 MED ORDER — LACTATED RINGERS IV BOLUS
500.0000 mL | Freq: Once | INTRAVENOUS | Status: AC
  Administered 2022-02-21: 500 mL via INTRAVENOUS

## 2022-02-21 MED ORDER — ISOPROPYL ALCOHOL 70 % SOLN
Status: AC
  Filled 2022-02-21: qty 480

## 2022-02-21 MED ORDER — PROPOFOL 1000 MG/100ML IV EMUL
INTRAVENOUS | Status: AC
  Filled 2022-02-21: qty 100

## 2022-02-21 MED ORDER — MIDAZOLAM HCL 2 MG/2ML IJ SOLN
INTRAMUSCULAR | Status: DC | PRN
  Administered 2022-02-21 (×2): 1 mg via INTRAVENOUS

## 2022-02-21 MED ORDER — FENTANYL CITRATE (PF) 100 MCG/2ML IJ SOLN
INTRAMUSCULAR | Status: DC | PRN
  Administered 2022-02-21 (×2): 50 ug via INTRAVENOUS

## 2022-02-21 MED ORDER — ACETAMINOPHEN 500 MG PO TABS
1000.0000 mg | ORAL_TABLET | Freq: Four times a day (QID) | ORAL | Status: DC
  Administered 2022-02-21: 1000 mg via ORAL

## 2022-02-21 MED ORDER — PROPOFOL 10 MG/ML IV BOLUS
INTRAVENOUS | Status: DC | PRN
  Administered 2022-02-21: 40 mg via INTRAVENOUS
  Administered 2022-02-21 (×5): 10 mg via INTRAVENOUS

## 2022-02-21 MED ORDER — HYDROMORPHONE HCL 1 MG/ML IJ SOLN: 0.5000 mg | INTRAMUSCULAR | Status: DC | PRN

## 2022-02-21 MED ORDER — SODIUM CHLORIDE 0.9 % IR SOLN
Status: DC | PRN
  Administered 2022-02-21: 3000 mL

## 2022-02-21 MED ORDER — OXYCODONE HCL 5 MG PO TABS
5.0000 mg | ORAL_TABLET | ORAL | Status: DC | PRN
  Administered 2022-02-21: 5 mg via ORAL

## 2022-02-21 MED ORDER — PHENYLEPHRINE HCL-NACL 20-0.9 MG/250ML-% IV SOLN
INTRAVENOUS | Status: DC | PRN
Start: 2022-02-21 — End: 2022-02-21
  Administered 2022-02-21: 30 ug/min via INTRAVENOUS

## 2022-02-21 MED ORDER — ACETAMINOPHEN 325 MG PO TABS: 325.0000 mg | ORAL_TABLET | Freq: Four times a day (QID) | ORAL | Status: DC | PRN

## 2022-02-21 MED ORDER — FENTANYL CITRATE PF 50 MCG/ML IJ SOSY: 25.0000 ug | PREFILLED_SYRINGE | INTRAMUSCULAR | Status: DC | PRN

## 2022-02-21 MED ORDER — KETOROLAC TROMETHAMINE 15 MG/ML IJ SOLN
7.5000 mg | Freq: Four times a day (QID) | INTRAMUSCULAR | Status: DC
  Administered 2022-02-21: 7.5 mg via INTRAVENOUS

## 2022-02-21 MED ORDER — SODIUM CHLORIDE 0.9% FLUSH
INTRAVENOUS | Status: DC | PRN
Start: 2022-02-21 — End: 2022-02-21
  Administered 2022-02-21: 60 mL via INTRADERMAL

## 2022-02-21 MED ORDER — CEFAZOLIN SODIUM-DEXTROSE 2-4 GM/100ML-% IV SOLN
2.0000 g | INTRAVENOUS | Status: AC
  Administered 2022-02-21: 2 g via INTRAVENOUS
  Filled 2022-02-21: qty 100

## 2022-02-21 MED ORDER — OXYCODONE HCL 5 MG PO TABS
5.0000 mg | ORAL_TABLET | ORAL | 0 refills | Status: AC | PRN
  Filled 2022-02-21: qty 40, 7d supply, fill #0

## 2022-02-21 MED ORDER — SODIUM CHLORIDE (PF) 0.9 % IJ SOLN
INTRAMUSCULAR | Status: AC
  Filled 2022-02-21: qty 50

## 2022-02-21 MED ORDER — LACTATED RINGERS IV SOLN: INTRAVENOUS | Status: DC

## 2022-02-21 MED ORDER — ASPIRIN EC 81 MG PO TBEC: 81.0000 mg | DELAYED_RELEASE_TABLET | Freq: Two times a day (BID) | ORAL | 0 refills | Status: AC

## 2022-02-21 MED ORDER — ONDANSETRON HCL 4 MG PO TABS
4.0000 mg | ORAL_TABLET | Freq: Three times a day (TID) | ORAL | 0 refills | Status: AC | PRN
  Filled 2022-02-21: qty 14, 5d supply, fill #0

## 2022-02-21 MED ORDER — METHOCARBAMOL 500 MG PO TABS: 500.0000 mg | ORAL_TABLET | Freq: Four times a day (QID) | ORAL | Status: DC | PRN

## 2022-02-21 MED ORDER — WATER FOR IRRIGATION, STERILE IR SOLN
Status: DC | PRN
  Administered 2022-02-21: 2000 mL

## 2022-02-21 MED ORDER — ACETAMINOPHEN 500 MG PO TABS
ORAL_TABLET | ORAL | Status: AC
  Filled 2022-02-21: qty 2

## 2022-02-21 MED ORDER — BUPIVACAINE IN DEXTROSE 0.75-8.25 % IT SOLN
INTRATHECAL | Status: DC | PRN
  Administered 2022-02-21: 1.6 mL via INTRATHECAL

## 2022-02-21 MED ORDER — LACTATED RINGERS IV BOLUS
250.0000 mL | Freq: Once | INTRAVENOUS | Status: AC
  Administered 2022-02-21: 250 mL via INTRAVENOUS

## 2022-02-21 MED ORDER — BUPIVACAINE LIPOSOME 1.3 % IJ SUSP
20.0000 mL | Freq: Once | INTRAMUSCULAR | Status: DC
Start: 2022-02-21 — End: 2022-02-21

## 2022-02-21 MED ORDER — ONDANSETRON HCL 4 MG/2ML IJ SOLN
INTRAMUSCULAR | Status: DC | PRN
Start: 2022-02-21 — End: 2022-02-21
  Administered 2022-02-21: 4 mg via INTRAVENOUS

## 2022-02-21 MED ORDER — CHLORHEXIDINE GLUCONATE 0.12 % MT SOLN
15.0000 mL | Freq: Once | OROMUCOSAL | Status: AC
  Administered 2022-02-21: 15 mL via OROMUCOSAL

## 2022-02-21 MED ORDER — CEFAZOLIN SODIUM-DEXTROSE 2-4 GM/100ML-% IV SOLN: 2.0000 g | Freq: Four times a day (QID) | INTRAVENOUS | Status: DC

## 2022-02-21 MED ORDER — TRANEXAMIC ACID-NACL 1000-0.7 MG/100ML-% IV SOLN
1000.0000 mg | INTRAVENOUS | Status: AC
  Administered 2022-02-21: 1000 mg via INTRAVENOUS
  Filled 2022-02-21: qty 100

## 2022-02-21 MED ORDER — KETOROLAC TROMETHAMINE 15 MG/ML IJ SOLN
INTRAMUSCULAR | Status: AC
  Filled 2022-02-21: qty 1

## 2022-02-21 MED ORDER — FENTANYL CITRATE (PF) 100 MCG/2ML IJ SOLN
INTRAMUSCULAR | Status: AC
  Filled 2022-02-21: qty 2

## 2022-02-21 MED ORDER — DEXAMETHASONE SODIUM PHOSPHATE 10 MG/ML IJ SOLN
8.0000 mg | Freq: Once | INTRAMUSCULAR | Status: AC
  Administered 2022-02-21: 8 mg via INTRAVENOUS

## 2022-02-21 MED ORDER — ONDANSETRON HCL 4 MG/2ML IJ SOLN
INTRAMUSCULAR | Status: AC
  Filled 2022-02-21: qty 2

## 2022-02-21 MED ORDER — AMISULPRIDE (ANTIEMETIC) 5 MG/2ML IV SOLN: 10.0000 mg | Freq: Once | INTRAVENOUS | Status: DC | PRN

## 2022-02-21 MED ORDER — METHOCARBAMOL 500 MG IVPB - SIMPLE MED: 500.0000 mg | Freq: Four times a day (QID) | INTRAVENOUS | Status: DC | PRN

## 2022-02-21 MED ORDER — SODIUM CHLORIDE 0.9 % IV SOLN: INTRAVENOUS | Status: DC

## 2022-02-21 MED ORDER — METHOCARBAMOL 500 MG PO TABS
500.0000 mg | ORAL_TABLET | Freq: Three times a day (TID) | ORAL | 0 refills | Status: AC | PRN
  Filled 2022-02-21: qty 30, 10d supply, fill #0

## 2022-02-21 MED ORDER — ONDANSETRON HCL 4 MG/2ML IJ SOLN: 4.0000 mg | Freq: Once | INTRAMUSCULAR | Status: DC | PRN

## 2022-02-21 MED ORDER — BUPIVACAINE LIPOSOME 1.3 % IJ SUSP
INTRAMUSCULAR | Status: AC
  Filled 2022-02-21: qty 20

## 2022-02-21 MED ORDER — POVIDONE-IODINE 10 % EX SWAB
2.0000 "application " | Freq: Once | CUTANEOUS | Status: AC
  Administered 2022-02-21: 2 via TOPICAL

## 2022-02-21 MED ORDER — ONDANSETRON HCL 4 MG/2ML IJ SOLN: 4.0000 mg | Freq: Four times a day (QID) | INTRAMUSCULAR | Status: DC | PRN

## 2022-02-21 MED ORDER — CEFAZOLIN SODIUM-DEXTROSE 2-4 GM/100ML-% IV SOLN
INTRAVENOUS | Status: AC
  Administered 2022-02-21: 2 g via INTRAVENOUS
  Filled 2022-02-21: qty 100

## 2022-02-21 MED ORDER — ACETAMINOPHEN 500 MG PO TABS
1000.0000 mg | ORAL_TABLET | Freq: Once | ORAL | Status: AC
  Administered 2022-02-21: 1000 mg via ORAL
  Filled 2022-02-21: qty 2

## 2022-02-21 MED ORDER — ACETAMINOPHEN 500 MG PO TABS: 1000.0000 mg | ORAL_TABLET | Freq: Three times a day (TID) | ORAL | 0 refills | Status: AC | PRN

## 2022-02-21 MED ORDER — SODIUM CHLORIDE (PF) 0.9 % IJ SOLN
INTRAMUSCULAR | Status: AC
  Filled 2022-02-21: qty 10

## 2022-02-21 MED ORDER — BUPIVACAINE LIPOSOME 1.3 % IJ SUSP
INTRAMUSCULAR | Status: DC | PRN
  Administered 2022-02-21: 20 mL

## 2022-02-21 MED ORDER — CELECOXIB 200 MG PO CAPS
400.0000 mg | ORAL_CAPSULE | Freq: Once | ORAL | Status: AC
  Administered 2022-02-21: 400 mg via ORAL
  Filled 2022-02-21: qty 2

## 2022-02-21 MED ORDER — 0.9 % SODIUM CHLORIDE (POUR BTL) OPTIME
TOPICAL | Status: DC | PRN
  Administered 2022-02-21: 1000 mL

## 2022-02-21 SURGICAL SUPPLY — 63 items
BAG COUNTER SPONGE SURGICOUNT (BAG) ×1 IMPLANT
BLADE SAG 18X100X1.27 (BLADE) ×2 IMPLANT
BLADE SAW SAG 35X64 .89 (BLADE) ×2 IMPLANT
BLADE SURG 15 STRL LF DISP TIS (BLADE) IMPLANT
BLADE SURG 15 STRL SS (BLADE) ×1
BNDG COHESIVE 3X5 TAN ST LF (GAUZE/BANDAGES/DRESSINGS) ×2 IMPLANT
BNDG ELASTIC 6X10 VLCR STRL LF (GAUZE/BANDAGES/DRESSINGS) ×2 IMPLANT
BOWL SMART MIX CTS (DISPOSABLE) ×2 IMPLANT
CEMENT BONE REFOBACIN R1X40 US (Cement) ×2 IMPLANT
CHLORAPREP W/TINT 26 (MISCELLANEOUS) ×4 IMPLANT
CLSR STERI-STRIP ANTIMIC 1/2X4 (GAUZE/BANDAGES/DRESSINGS) ×1 IMPLANT
COMP FEM CEMT PERS SZ 7 LT (Joint) ×2 IMPLANT
COMP PATELLA 32 STD 8.5 THK (Orthopedic Implant) IMPLANT
COMPONENT FEM CMT PERS SZ7 LT (Joint) IMPLANT
COVER SURGICAL LIGHT HANDLE (MISCELLANEOUS) ×2 IMPLANT
CUFF TOURN SGL QUICK 34 (TOURNIQUET CUFF) ×1
CUFF TRNQT CYL 34X4.125X (TOURNIQUET CUFF) ×1 IMPLANT
DERMABOND ADVANCED (GAUZE/BANDAGES/DRESSINGS) ×1
DERMABOND ADVANCED .7 DNX12 (GAUZE/BANDAGES/DRESSINGS) ×1 IMPLANT
DRAPE INCISE IOBAN 85X60 (DRAPES) ×2 IMPLANT
DRAPE SHEET LG 3/4 BI-LAMINATE (DRAPES) ×2 IMPLANT
DRAPE U-SHAPE 47X51 STRL (DRAPES) ×2 IMPLANT
DRESSING AQUACEL AG SP 3.5X10 (GAUZE/BANDAGES/DRESSINGS) ×1 IMPLANT
DRSG AQUACEL AG SP 3.5X10 (GAUZE/BANDAGES/DRESSINGS) ×2
GAUZE SPONGE 4X4 12PLY STRL (GAUZE/BANDAGES/DRESSINGS) ×2 IMPLANT
GLOVE BIO SURGEON STRL SZ8 (GLOVE) ×4 IMPLANT
GLOVE BIOGEL PI IND STRL 8 (GLOVE) ×1 IMPLANT
GLOVE BIOGEL PI INDICATOR 8 (GLOVE) ×1
GOWN STRL REUS W/ TWL XL LVL3 (GOWN DISPOSABLE) ×1 IMPLANT
GOWN STRL REUS W/TWL XL LVL3 (GOWN DISPOSABLE) ×1
HANDPIECE INTERPULSE COAX TIP (DISPOSABLE) ×1
HDLS TROCR DRIL PIN KNEE 75 (PIN) ×1
HOOD PEEL AWAY FLYTE STAYCOOL (MISCELLANEOUS) ×6 IMPLANT
INSERT TIB ASF 14 6-7/CD LT (Insert) ×1 IMPLANT
MANIFOLD NEPTUNE II (INSTRUMENTS) ×2 IMPLANT
MARKER SKIN DUAL TIP RULER LAB (MISCELLANEOUS) ×2 IMPLANT
NS IRRIG 1000ML POUR BTL (IV SOLUTION) ×2 IMPLANT
PACK TOTAL KNEE CUSTOM (KITS) ×2 IMPLANT
PATELLA ZIMMER 32MM (Orthopedic Implant) ×2 IMPLANT
PIN DRILL HDLS TROCAR 75 4PK (PIN) IMPLANT
PROTECTOR NERVE ULNAR (MISCELLANEOUS) ×1 IMPLANT
SCREW HEADED 33MM KNEE (MISCELLANEOUS) ×2 IMPLANT
SET HNDPC FAN SPRY TIP SCT (DISPOSABLE) ×1 IMPLANT
SOLUTION IRRIG SURGIPHOR (IV SOLUTION) IMPLANT
SPIKE FLUID TRANSFER (MISCELLANEOUS) ×2 IMPLANT
STEM TIB ST PERS 14+30 (Stem) ×1 IMPLANT
STEM TIBIA 5 DEG SZ D L KNEE (Knees) IMPLANT
STRIP CLOSURE SKIN 1/2X4 (GAUZE/BANDAGES/DRESSINGS) ×2 IMPLANT
SUT MNCRL AB 3-0 PS2 18 (SUTURE) ×3 IMPLANT
SUT STRATAFIX 0 PDS 27 VIOLET (SUTURE) ×2
SUT STRATAFIX PDO 1 14 VIOLET (SUTURE) ×1
SUT STRATFX PDO 1 14 VIOLET (SUTURE) ×1
SUT VIC AB 2-0 CT1 27 (SUTURE) ×1
SUT VIC AB 2-0 CT1 TAPERPNT 27 (SUTURE) IMPLANT
SUT VIC AB 2-0 CT2 27 (SUTURE) ×4 IMPLANT
SUTURE STRATFX 0 PDS 27 VIOLET (SUTURE) ×1 IMPLANT
SUTURE STRATFX PDO 1 14 VIOLET (SUTURE) ×1 IMPLANT
SYR 50ML LL SCALE MARK (SYRINGE) ×2 IMPLANT
TIBIA STEM 5 DEG SZ D L KNEE (Knees) ×2 IMPLANT
TRAY FOLEY MTR SLVR 14FR STAT (SET/KITS/TRAYS/PACK) ×1 IMPLANT
TUBE SUCTION HIGH CAP CLEAR NV (SUCTIONS) ×2 IMPLANT
UNDERPAD 30X36 HEAVY ABSORB (UNDERPADS AND DIAPERS) ×2 IMPLANT
WRAP KNEE MAXI GEL POST OP (GAUZE/BANDAGES/DRESSINGS) IMPLANT

## 2022-02-21 NOTE — Anesthesia Preprocedure Evaluation (Signed)
Anesthesia Evaluation  ?Patient identified by MRN, date of birth, ID band ?Patient awake ? ? ? ?Reviewed: ?Allergy & Precautions, NPO status , Patient's Chart, lab work & pertinent test results ? ?Airway ?Mallampati: II ? ?TM Distance: >3 FB ?Neck ROM: Full ? ? ? Dental ?no notable dental hx. ? ?  ?Pulmonary ?neg pulmonary ROS,  ?  ?Pulmonary exam normal ? ? ? ? ? ? ? Cardiovascular ?negative cardio ROS ?Normal cardiovascular exam ? ? ?  ?Neuro/Psych ?PSYCHIATRIC DISORDERS Depression negative neurological ROS ?   ? GI/Hepatic ?negative GI ROS, Neg liver ROS,   ?Endo/Other  ?diabetes, Oral Hypoglycemic AgentsHypothyroidism  ? Renal/GU ?negative Renal ROS  ? ?  ?Musculoskeletal ? ?(+) Arthritis , scoliosis  ? Abdominal ?  ?Peds ? Hematology ?negative hematology ROS ?(+)   ?Anesthesia Other Findings ?OA LEFT KNEE ? Reproductive/Obstetrics ? ?  ? ? ? ? ? ? ? ? ? ? ? ? ? ?  ?  ? ? ? ? ? ? ? ? ?Anesthesia Physical ?Anesthesia Plan ? ?ASA: 2 ? ?Anesthesia Plan: Regional and Spinal  ? ?Post-op Pain Management:   ? ?Induction: Intravenous ? ?PONV Risk Score and Plan: 2 and Ondansetron, Dexamethasone, Midazolam and Treatment may vary due to age or medical condition ? ?Airway Management Planned: Simple Face Mask ? ?Additional Equipment:  ? ?Intra-op Plan:  ? ?Post-operative Plan:  ? ?Informed Consent: I have reviewed the patients History and Physical, chart, labs and discussed the procedure including the risks, benefits and alternatives for the proposed anesthesia with the patient or authorized representative who has indicated his/her understanding and acceptance.  ? ? ? ?Dental advisory given ? ?Plan Discussed with: CRNA ? ?Anesthesia Plan Comments:   ? ? ? ? ? ? ?Anesthesia Quick Evaluation ? ?

## 2022-02-21 NOTE — Op Note (Signed)
DATE OF SURGERY:  02/21/2022 ?TIME: 9:42 AM ? ?PATIENT NAME:  Ashley Shaffer   ?AGE: 71 y.o.  ? ? ?PRE-OPERATIVE DIAGNOSIS:  End-stage left knee osteoarthritis, severe valgus deformity ? ?POST-OPERATIVE DIAGNOSIS:  Same ? ?PROCEDURE:  Left Total Knee Arthroplasty ? ?SURGEON:  Willaim Sheng, MD  ? ?ASSISTANT: Izola Price, RNFA, present and scrubbed throughout the case, critical for assistance with exposure, retraction, instrumentation, and closure. ? ? ?OPERATIVE IMPLANTS:  ?Cemented Zimmer persona, 7 narrow CR femur, D tibia, with 30 mm stem extension, 32 mm patella, 14 mm MC poly, 2 batches of antibiotic cement (diabetes) ? ?  ?PREOPERATIVE INDICATIONS: ? ?Ashley Shaffer is a 71 y.o. year old female with end stage bone on bone degenerative arthritis of the knee who failed conservative treatment, including injections, antiinflammatories, activity modification, and assistive devices, and had significant impairment of their activities of daily living, and elected for Total Knee Arthroplasty.  ? ?The risks, benefits, and alternatives were discussed at length including but not limited to the risks of infection, bleeding, nerve injury, stiffness, blood clots, the need for revision surgery, cardiopulmonary complications, among others, and they were willing to proceed. ? ?OPERATIVE FINDINGS AND UNIQUE ASPECTS OF THE CASE: Severe valgus deformity tight in extension and release of IT band. ? ?ESTIMATED BLOOD LOSS: 25cc ? ?OPERATIVE DESCRIPTION: ? ? Once adequate anesthesia, preoperative antibiotics, 2 gm of ancef,1 gm of Tranexamic Acid, and 8 mg of Decadron administered, the patient was positioned supine with a left thigh tourniquet placed.  The left lower extremity was prepped and draped in sterile fashion.  A time-  out was performed identifying the patient, planned procedure, and the appropriate extremity.  ?   ?The leg was  exsanguinated, tourniquet elevated to 250 mmHg.  A midline incision was  ? made  followed by median parapatellar arthrotomy. Anterior horn of the medial meniscus was released and resected. A medial release was performed, the infrapatellar fat pad was resected with care taken to protect the patellar tendon. The suprapatellar fat was removed to exposed the distal anterior femur. The anterior horn of the lateral meniscus and ACL were released.   ? ?Following initial  exposure, attention was first to the femur.  The femoral canal was opened with a drill, canal was suctioned to try to prevent fat emboli.  An  intramedullary rod was passed, notably the patient had deficient bone on the femoral side, to help with balancing and obtain an appropriate cut guide set at 6 degrees valgus, 11 mm. The distal femur was resected.  Following this resection, the tibia was  ? subluxated anteriorly.  Using the extramedullary guide, 2 mm of bone was resected off  ? the lateral tibial defect.  We confirmed with a size 23m spacer block that the coronal alignment was corrected, the patient had full extension,, and that the tibial cut was perpendicular in the coronal plane, checking with an alignment rod.  The lateral side was notably tighter than the medial side. ? ? Once this was done, the posterior femoral referencing femoral sizer was placed under to the posterior condyles with 3 degrees of external rotational which was parallel to the transepicondylar axis and perpendicular to [employer] The femur was sized to be a size 7 in the anterior-  posterior dimension. The anterior, posterior, and  chamfer cuts were made without difficulty nor  ? notching making certain that I was along the anterior cortex to help  with flexion gap stability.  The remaining  lateral femoral osteophytes were removed with a rongeur. Next a laminar spreader was placed with the knee in flexion and the medial lateral menisci were resected.  5 cc of the Exparel mixture was injected in the medial side of the back of the knee and 3 cc in the  lateral side.  1/2 inch curved osteotome was used to resect posterior osteophyte that was then removed with a pituitary rongeur.  ?    ? At this point, the tibia was sized to be a size D.  The size D tray was  ? then pinned in position. Trial reduction was now carried with a 7 femur, ? D tibia, a 10 mm MC insert.  The knee was significantly tighter lateral and there was liftoff on the medial side.  To help balance the extension gap and extensive lateral release was performed including release of the IT band at the level of the joint line.  This was extended around posteriorly using a 15 blade.  The knee now also had notable hyperextension.  We increased the polyethylene to a 14 mm insert, with this had full extension and good stability medial lateral.  The knee was slightly tight in flexion and the PCL was partially released.  ? ?Attention was next directed to the patella.  Precut  measurement was noted to be 19 mm.  I resected down to 13 mm and used a  24m patellar button to restore patellar height as well as cover the cut surface.  ? ? ? The patella lug holes were drilled and a 32 mm patella poly trial was placed. ? ?  The knee was brought to full extension with good flexion stability with the patella  ? tracking through the trochlea without application of pressure.   ? ? ?Next the femoral component was again assessed and determined to be seated and appropriately lateralized.  The femoral lug holes were drilled.  The femoral component was then removed.Tibial component was again assessed and felt to be seated and appropriately rotated with the medial third of the tubercle. The tibia was then drilled, and keel punched.   ? ? Final components were  opened and antibiotic cement was mixed.   ?   ?Final implants were then  cemented onto cleaned and dried cut surfaces of bone with the knee brought to extension with a 14 mm MC poly.  The knee was irrigated with sterile Betadine diluted in saline as well as pulse lavage  normal saline. The synovial lining was  then injected a dilute Exparel.  ?   ? Once the cement had fully cured, excess cement was removed  ? throughout the knee.  I confirmed that I was satisfied with the range of  ? motion and stability.  Prior to inserting the real polyethylene liner, the tourniquet was let down at 75 minutes to confirm no significant bleeding laterally given the extensive lateral releases.  No significant bleeding was noted.  Good hemostasis was achieved.  And the final 14 mm MC poly insert was chosen.  It was  ? placed into the knee.  ?   ?  The medial parapatellar arthrotomy was then reapproximated using #1 Stratafix sutures with the knee  in flexion.  The  ? remaining wound was closed with 0 stratafix, 2-0 Vicryl, and running 3-0 Monocryl.  ? The knee was cleaned, dried, dressed sterilely using Dermabond and  ? Aquacel dressing.  The patient was then  brought to recovery room in stable condition, tolerating  the procedure  well. There were no complications. ? ? ?Post op recs: ?WB: WBAT ?Abx: ancef x23 hours post op ?Imaging: PACU xrays ?DVT prophylaxis: Aspirin '81mg'$  BID x4 weeks ?Follow up: 2 weeks after surgery for a wound check with Dr. Zachery Dakins at [facility].  ?Address: [address], [place], [place] [address]  ?Office Phone: [phone] ? ?Charlies Constable, MD ?Orthopaedic Surgery ? ? ? ? ? ? ? ? ? ? ? ? ?  ?

## 2022-02-21 NOTE — Anesthesia Procedure Notes (Signed)
Spinal ? ?Patient location during procedure: OR ?Start time: 02/21/2022 7:25 AM ?End time: 02/21/2022 7:32 AM ?Reason for block: surgical anesthesia ?Staffing ?Performed: anesthesiologist  ?Anesthesiologist: Murvin Natal, MD ?Preanesthetic Checklist ?Completed: patient identified, IV checked, risks and benefits discussed, surgical consent, monitors and equipment checked, pre-op evaluation and timeout performed ?Spinal Block ?Patient position: sitting ?Prep: DuraPrep ?Patient monitoring: cardiac monitor, continuous pulse ox and blood pressure ?Approach: midline ?Location: L4-5 ?Injection technique: single-shot ?Needle ?Needle type: Pencan  ?Needle gauge: 24 G ?Needle length: 9 cm ?Assessment ?Sensory level: T10 ?Events: CSF return ?Additional Notes ?Functioning IV was confirmed and monitors were applied. Sterile prep and drape, including hand hygiene and sterile gloves were used. The patient was positioned and the spine was prepped. The skin was anesthetized with lidocaine.  Free flow of clear CSF was obtained prior to injecting local anesthetic into the CSF.  The spinal needle aspirated freely following injection.  The needle was carefully withdrawn.  The patient tolerated the procedure well.  ? ? ? ?

## 2022-02-21 NOTE — Evaluation (Signed)
Physical Therapy Evaluation ?Patient Details ?Name: Ashley Shaffer ?MRN: [id number] ?DOB: 1950-12-23 ?Today's Date: 02/21/2022 ? ?History of Present Illness ? Pt is a 71yo female presenting s/p L-TKA on 02/21/22. PMH: OA, depression, DM, HLD, vertigo,  ?Clinical Impression ? Yakima Kreitzer is a 71 y.o. female POD 0 s/p L-TKA. Patient reports modified independence with mobility at baseline. Patient is now limited by functional impairments (see PT problem list below) and requires min guard for transfers and gait with RW. Patient was able to ambulate 70 feet with RW and min guard assist and cues for safe walker management. Patient educated on safe sequencing for stair mobility and verbalized safe guarding position for people assisting with mobility. Patient instructed in exercises to facilitate ROM and circulation. Patient will benefit from continued skilled PT interventions to address impairments and progress towards PLOF. Patient has met mobility goals at adequate level for discharge home; will continue to follow if pt continues acute stay to progress towards Mod I goals.   ?   ? ?Recommendations for follow up therapy are one component of a multi-disciplinary discharge planning process, led by the attending physician.  Recommendations may be updated based on patient status, additional functional criteria and insurance authorization. ? ?Follow Up Recommendations Follow physician's recommendations for discharge plan and follow up therapies ? ?  ?Assistance Recommended at Discharge Set up Supervision/Assistance  ?Patient can return home with the following ? A little help with walking and/or transfers;A little help with bathing/dressing/bathroom;Assistance with cooking/housework;Assist for transportation;Help with stairs or ramp for entrance ? ?  ?Equipment Recommendations None recommended by PT  ?Recommendations for Other Services ?    ?  ?Functional Status Assessment Patient has had a recent decline in their  functional status and demonstrates the ability to make significant improvements in function in a reasonable and predictable amount of time.  ? ?  ?Precautions / Restrictions Restrictions ?Weight Bearing Restrictions: Yes ?LLE Weight Bearing: Weight bearing as tolerated  ? ?  ? ?Mobility ? Bed Mobility ?Overal bed mobility: Needs Assistance ?Bed Mobility: Supine to Sit ?  ?  ?Supine to sit: Min guard ?  ?  ?General bed mobility comments: Pt min guard for bed mobility, no physical assist required. ?  ? ?Transfers ?Overall transfer level: Needs assistance ?Equipment used: Rolling walker (2 wheels) ?Transfers: Sit to/from Stand ?Sit to Stand: Min guard ?  ?  ?  ?  ?  ?General transfer comment: Pt min guard for safety only, no physical assist required. ?  ? ?Ambulation/Gait ?Ambulation/Gait assistance: Min guard, Supervision ?Gait Distance (Feet): 70 Feet ?Assistive device: Rolling walker (2 wheels) ?Gait Pattern/deviations: Step-through pattern ?Gait velocity: decreased ?  ?  ?General Gait Details: Pt ambulated with step to pattern, using RW with min guard assist, no overt LOB noted. VCs for proximity to device. ? ?Stairs ?Stairs: Yes ?Stairs assistance: Min guard ?Stair Management: One rail Left, Step to pattern, Sideways ?Number of Stairs: 2 ?General stair comments: Pt educated on appropriate stair mobility using one rail on left. Pt demonstrated step-to pattern, sideways facing railing, no overt LOB. Safe technique demonstrated and verbalized appropriate guarding technique for caregiver. ? ?Wheelchair Mobility ?  ? ?Modified Rankin (Stroke Patients Only) ?  ? ?  ? ?Balance Overall balance assessment: Needs assistance ?Sitting-balance support: Feet supported, No upper extremity supported ?Sitting balance-Leahy Scale: Fair ?  ?  ?Standing balance support: Bilateral upper extremity supported, During functional activity, Reliant on assistive device for balance ?Standing balance-Leahy Scale: Poor ?  ?  ?  ?  ?  ?  ?  ?  ?   ?  ?  ?  ?   ? ? ? ?  Pertinent Vitals/Pain Pain Assessment ?Pain Assessment: 0-10 ?Pain Score: 2  ?Pain Location: L knee ?Pain Descriptors / Indicators: Dull, Operative site guarding ?Pain Intervention(s): Limited activity within patient's tolerance, Monitored during session, Repositioned, Ice applied  ? ? ?Home Living Family/patient expects to be discharged to:: Private residence ?Living Arrangements: Spouse/significant other ?Available Help at Discharge: Family;Available 24 hours/day ?Type of Home: House ?Home Access: Stairs to enter ?Entrance Stairs-Rails: None ?Entrance Stairs-Number of Steps: 3 ?  ?Home Layout: Two level;Able to live on main level with bedroom/bathroom ?Home Equipment: Animator (2 wheels);Shower seat - built in ?   ?  ?Prior Function Prior Level of Function : Independent/Modified Independent ?  ?  ?  ?  ?  ?  ?Mobility Comments: RW for mobility PTA ?ADLs Comments: IND ?  ? ? ?Hand Dominance  ? Dominant Hand: Right ? ?  ?Extremity/Trunk Assessment  ? Upper Extremity Assessment ?Upper Extremity Assessment: Overall WFL for tasks assessed ?  ? ?Lower Extremity Assessment ?Lower Extremity Assessment: RLE deficits/detail;LLE deficits/detail ?RLE Deficits / Details: MMT ank df/pf 5/5 ?RLE Sensation: WNL ?LLE Deficits / Details: MMT ank df/pf 5/5, no extensor lag noted ?LLE Sensation: WNL ?  ? ?Cervical / Trunk Assessment ?Cervical / Trunk Assessment: Normal  ?Communication  ? Communication: No difficulties  ?Cognition Arousal/Alertness: Awake/alert ?Behavior During Therapy: [facility] for tasks assessed/performed ?Overall Cognitive Status: Within Functional Limits for tasks assessed ?  ?  ?  ?  ?  ?  ?  ?  ?  ?  ?  ?  ?  ?  ?  ?  ?  ?  ?  ? ?  ?General Comments   ? ?  ?Exercises Total Joint Exercises ?Ankle Circles/Pumps: AROM, 20 reps, Both ?Quad Sets: AROM, Left, Other reps (comment) (2) ?Short Arc Quad: AAROM, Left, Other reps (comment) (3) ?Heel Slides: AAROM, Left, Other reps  (comment) (3) ?Hip ABduction/ADduction: AROM, Right, Other reps (comment) (2) ?Straight Leg Raises: AROM, Other reps (comment), Left (2)  ? ?Assessment/Plan  ?  ?PT Assessment Patient needs continued PT services  ?PT Problem List Decreased strength;Decreased range of motion;Decreased activity tolerance;Decreased balance;Decreased mobility;Decreased coordination;Decreased knowledge of use of DME;Pain ? ?   ?  ?PT Treatment Interventions DME instruction;Gait training;Stair training;Functional mobility training;Therapeutic activities;Therapeutic exercise;Neuromuscular re-education;Balance training;Patient/family education   ? ?PT Goals (Current goals can be found in the Care Plan section)  ?Acute Rehab PT Goals ?Patient Stated Goal: Ride my horse ?PT Goal Formulation: With patient ?Time For Goal Achievement: 02/28/22 ?Potential to Achieve Goals: Good ? ?  ?Frequency 7X/week ?  ? ? ?Co-evaluation   ?  ?  ?  ?  ? ? ?  ?AM-PAC PT "6 Clicks" Mobility  ?Outcome Measure Help needed turning from your back to your side while in a flat bed without using bedrails?: None ?Help needed moving from lying on your back to sitting on the side of a flat bed without using bedrails?: A Little ?Help needed moving to and from a bed to a chair (including a wheelchair)?: A Little ?Help needed standing up from a chair using your arms (e.g., wheelchair or bedside chair)?: A Little ?Help needed to walk in hospital room?: A Little ?Help needed climbing 3-5 steps with a railing? : A Little ?6 Click Score: 19 ? ?  ?End of Session Equipment Utilized During Treatment: Gait belt ?Activity Tolerance: Patient tolerated treatment well ?Patient left: in chair;with call bell/phone within reach ?Nurse Communication: Mobility status ?PT Visit Diagnosis: Difficulty in  walking, not elsewhere classified (R26.2);Pain ?Pain - Right/Left: Left ?Pain - part of body: Knee ?  ? ?Time: 1420-1500 ?PT Time Calculation (min) (ACUTE ONLY): 40 min ? ? ?Charges:   PT  Evaluation ?$PT Eval Low Complexity: 1 Low ?PT Treatments ?$Gait Training: 8-22 mins ?$Therapeutic Exercise: 8-22 mins ?  ?   ? ? ?Coolidge Breeze, PT, DPT ?[facility] Department ?Office: [phone] ?Pager

## 2022-02-21 NOTE — Transfer of Care (Signed)
Immediate Anesthesia Transfer of Care Note ? ?Patient: Ashley Shaffer ? ?Procedure(s) Performed: LEFT TOTAL KNEE ARTHROPLASTY (Left: Knee) ? ?Patient Location: PACU ? ?Anesthesia Type:Spinal and MAC combined with regional for post-op pain ? ?Level of Consciousness: awake, alert , oriented and patient cooperative ? ?Airway & Oxygen Therapy: Patient Spontanous Breathing ? ?Post-op Assessment: Report given to RN and Post -op Vital signs reviewed and stable ? ?Post vital signs: Reviewed and stable ? ?Last Vitals:  ?Vitals Value Taken Time  ?BP 131/71 02/21/22 0952  ?Temp    ?Pulse 67 02/21/22 0954  ?Resp 19 02/21/22 0954  ?SpO2 96 % 02/21/22 0954  ?Vitals shown include unvalidated device data. ? ?Last Pain:  ?Vitals:  ? 02/21/22 0556  ?TempSrc: Oral  ?PainSc:   ?   ? ?  ? ?Complications: No notable events documented. ?

## 2022-02-21 NOTE — Discharge Instructions (Signed)
INSTRUCTIONS AFTER JOINT REPLACEMENT  ? ?Remove items at home which could result in a fall. This includes throw rugs or furniture in walking pathways ?ICE to the affected joint every three hours while awake for 30 minutes at a time, for at least the first 3-5 days, and then as needed for pain and swelling.  Continue to use ice for pain and swelling. You may notice swelling that will progress down to the foot and ankle.  This is normal after surgery.  Elevate your leg when you are not up walking on it.   ?Continue to use the breathing machine you got in the hospital (incentive spirometer) which will help keep your temperature down.  It is common for your temperature to cycle up and down following surgery, especially at night when you are not up moving around and exerting yourself.  The breathing machine keeps your lungs expanded and your temperature down. ? ? ?DIET:  As you were doing prior to hospitalization, we recommend a well-balanced diet. ? ?DRESSING / WOUND CARE / SHOWERING ? ?Keep the surgical dressing until follow up.  The dressing is water proof, so you can shower without any extra covering.  IF THE DRESSING FALLS OFF or the wound gets wet inside, change the dressing with sterile gauze.  Please use good hand washing techniques before changing the dressing.  Do not use any lotions or creams on the incision until instructed by your surgeon.   ? ?ACTIVITY ? ?Increase activity slowly as tolerated, but follow the weight bearing instructions below.   ?No driving for 6 weeks or until further direction given by your physician.  You cannot drive while taking narcotics.  ?No lifting or carrying greater than 10 lbs. until further directed by your surgeon. ?Avoid periods of inactivity such as sitting longer than an hour when not asleep. This helps prevent blood clots.  ?You may return to work once you are authorized by your doctor.  ? ? ? ?WEIGHT BEARING  ? ?Weight bearing as tolerated with assist device (walker, cane,  etc) as directed, use it as long as suggested by your surgeon or therapist, typically at least 4-6 weeks. ? ? ?EXERCISES ? ?Results after joint replacement surgery are often greatly improved when you follow the exercise, range of motion and muscle strengthening exercises prescribed by your doctor. Safety measures are also important to protect the joint from further injury. Any time any of these exercises cause you to have increased pain or swelling, decrease what you are doing until you are comfortable again and then slowly increase them. If you have problems or questions, call your caregiver or physical therapist for advice.  ? ?Rehabilitation is important following a joint replacement. After just a few days of immobilization, the muscles of the leg can become weakened and shrink (atrophy).  These exercises are designed to build up the tone and strength of the thigh and leg muscles and to improve motion. Often times heat used for twenty to thirty minutes before working out will loosen up your tissues and help with improving the range of motion but do not use heat for the first two weeks following surgery (sometimes heat can increase post-operative swelling).  ? ?These exercises can be done on a training (exercise) mat, on the floor, on a table or on a bed. Use whatever works the best and is most comfortable for you.    Use music or television while you are exercising so that the exercises are a pleasant break in your   day. This will make your life better with the exercises acting as a break in your routine that you can look forward to.   Perform all exercises about fifteen times, three times per day or as directed.  You should exercise both the operative leg and the other leg as well. ? ?Exercises include: ?  ?Quad Sets - Tighten up the muscle on the front of the thigh (Quad) and hold for 5-10 seconds.   ?Straight Leg Raises - With your knee straight (if you were given a brace, keep it on), lift the leg to 60  degrees, hold for 3 seconds, and slowly lower the leg.  Perform this exercise against resistance later as your leg gets stronger.  ?Leg Slides: Lying on your back, slowly slide your foot toward your buttocks, bending your knee up off the floor (only go as far as is comfortable). Then slowly slide your foot back down until your leg is flat on the floor again.  ?Angel Wings: Lying on your back spread your legs to the side as far apart as you can without causing discomfort.  ?Hamstring Strength:  Lying on your back, push your heel against the floor with your leg straight by tightening up the muscles of your buttocks.  Repeat, but this time bend your knee to a comfortable angle, and push your heel against the floor.  You may put a pillow under the heel to make it more comfortable if necessary.  ? ?A rehabilitation program following joint replacement surgery can speed recovery and prevent re-injury in the future due to weakened muscles. Contact your doctor or a physical therapist for more information on knee rehabilitation.  ? ? ?CONSTIPATION ? ?Constipation is defined medically as fewer than three stools per week and severe constipation as less than one stool per week.  Even if you have a regular bowel pattern at home, your normal regimen is likely to be disrupted due to multiple reasons following surgery.  Combination of anesthesia, postoperative narcotics, change in appetite and fluid intake all can affect your bowels.  ? ?YOU MUST use at least one of the following options; they are listed in order of increasing strength to get the job done.  They are all available over the counter, and you may need to use some, POSSIBLY even all of these options:   ? ?Drink plenty of fluids (prune juice may be helpful) and high fiber foods ?Colace 100 mg by mouth twice a day  ?Senokot for constipation as directed and as needed Dulcolax (bisacodyl), take with full glass of water  ?Miralax (polyethylene glycol) once or twice a day as  needed. ? ?If you have tried all these things and are unable to have a bowel movement in the first 3-4 days after surgery call either your surgeon or your primary doctor.   ? ?If you experience loose stools or diarrhea, hold the medications until you stool forms back up.  If your symptoms do not get better within 1 week or if they get worse, check with your doctor.  If you experience "the worst abdominal pain ever" or develop nausea or vomiting, please contact the office immediately for further recommendations for treatment. ? ? ?ITCHING:  If you experience itching with your medications, try taking only a single pain pill, or even half a pain pill at a time.  You can also use Benadryl over the counter for itching or also to help with sleep.  ? ?TED HOSE STOCKINGS:  Use stockings on both   legs until for at least 2 weeks or as directed by physician office. They may be removed at night for sleeping. ? ?MEDICATIONS:  See your medication summary on the ?After Visit Summary? that nursing will review with you.  You may have some home medications which will be placed on hold until you complete the course of blood thinner medication.  It is important for you to complete the blood thinner medication as prescribed. ? ? ?Blood clot prevention (DVT Prophylaxis): After surgery you are at an increased risk for a blood clot. you were prescribed a blood thinner, Aspirin '81mg'$ , to be taken twice daily for a total of 4 weeks from surgery to help reduce your risk of getting a blood clot. This will help prevent a blood clot. Signs of a pulmonary embolus (blood clot in the lungs) include sudden short of breath, feeling lightheaded or dizzy, chest pain with a deep breath, rapid pulse rapid breathing. Signs of a blood clot in your arms or legs include new unexplained swelling and cramping, warm, red or darkened skin around the painful area. Please call the office or 911 right away if these signs or symptoms develop. ? ?PRECAUTIONS:  If you  experience chest pain or shortness of breath - call 911 immediately for transfer to the hospital emergency department.  ? ?If you develop a fever greater that 101 F, purulent drainage from wound, increased r

## 2022-02-21 NOTE — Anesthesia Procedure Notes (Signed)
Anesthesia Regional Block: Adductor canal block  ? ?Pre-Anesthetic Checklist: , timeout performed,  Correct Patient, Correct Site, Correct Laterality,  Correct Procedure,, site marked,  Risks and benefits discussed,  Surgical consent,  Pre-op evaluation,  At surgeon's request and post-op pain management ? ?Laterality: Left ? ?Prep: chloraprep     ?  ?Needles:  ?Injection technique: Single-shot ? ?Needle Type: Echogenic Stimulator Needle   ? ? ?Needle Length: 9cm  ?Needle Gauge: 21  ? ? ? ?Additional Needles: ? ? ?Procedures:,,,, ultrasound used (permanent image in chart),,    ?Narrative:  ?Start time: 02/21/2022 6:55 AM ?End time: 02/21/2022 7:05 AM ?Injection made incrementally with aspirations every 5 mL. ? ?Performed by: Personally  ?Anesthesiologist: Murvin Natal, MD ? ?Additional Notes: ?Functioning IV was confirmed and monitors were applied. A time-out was performed. Hand hygiene and sterile gloves were used. The thigh was placed in a frog-leg position and prepped in a sterile fashion. A 60m 21ga Arrow echogenic stimulator needle was placed using ultrasound guidance.  Negative aspiration and negative test dose prior to incremental administration of local anesthetic. The patient tolerated the procedure well. ? ? ? ? ? ?

## 2022-02-21 NOTE — Interval H&P Note (Signed)
The patient has been re-examined, and the chart reviewed, and there have been no interval changes to the documented history and physical.   ? ?Plan for left TKA today for end stage valgus knee OA. ? ?The operative side was examined and the patient was confirmed to have. Sens DPN, SPN, TN intact, Motor EHL, ext, flex 5/5, and DP 2+, PT 2+, No significant edema. ? ? ?The risks, benefits, and alternatives have been discussed at length with patient, and the patient is willing to proceed.  Left knee marked. Consent has been signed. ? ? ? ?

## 2022-02-21 NOTE — Anesthesia Postprocedure Evaluation (Signed)
Anesthesia Post Note ? ?Patient: Ashley Shaffer ? ?Procedure(s) Performed: LEFT TOTAL KNEE ARTHROPLASTY (Left: Knee) ? ?  ? ?Patient location during evaluation: PACU ?Anesthesia Type: Regional and Spinal ?Level of consciousness: awake ?Pain management: pain level controlled ?Vital Signs Assessment: post-procedure vital signs reviewed and stable ?Respiratory status: spontaneous breathing, respiratory function stable and patient connected to nasal cannula oxygen ?Cardiovascular status: blood pressure returned to baseline and stable ?Postop Assessment: no headache, no backache and no apparent nausea or vomiting ?Anesthetic complications: no ? ? ?No notable events documented. ? ?Last Vitals:  ?Vitals:  ? 02/21/22 1400 02/21/22 1531  ?BP: 122/67 121/73  ?Pulse: 68 64  ?Resp: 16 16  ?Temp:    ?SpO2: 93% 94%  ?  ?Last Pain:  ?Vitals:  ? 02/21/22 1531  ?TempSrc:   ?PainSc: 4   ? ? ?  ?  ?  ?  ?  ?  ? ?Munachimso Rigdon P Stevee Valenta ? ? ? ? ?

## 2022-02-21 NOTE — Progress Notes (Signed)
Orthopedic Tech Progress Note ?Patient Details:  ?Ashley Shaffer ?10-08-1951 ?[id number] ? ?Ortho Devices ?Type of Ortho Device: Bone foam zero knee ?Ortho Device/Splint Interventions: Application ?  ?Post Interventions ?Patient Tolerated: Well ?Instructions Provided: Care of device ? ?[name] ?02/21/2022, 10:22 AM ? ?

## 2022-02-22 ENCOUNTER — Encounter (HOSPITAL_COMMUNITY): Payer: Self-pay | Admitting: Orthopedic Surgery

## 2022-02-22 LAB — ABO/RH: ABO/RH(D): B POS

## 2022-02-23 DIAGNOSIS — M6281 Muscle weakness (generalized): Secondary | ICD-10-CM | POA: Diagnosis not present

## 2022-02-23 DIAGNOSIS — R2689 Other abnormalities of gait and mobility: Secondary | ICD-10-CM | POA: Diagnosis not present

## 2022-02-23 DIAGNOSIS — M25562 Pain in left knee: Secondary | ICD-10-CM | POA: Diagnosis not present

## 2022-02-23 DIAGNOSIS — Z4733 Aftercare following explantation of knee joint prosthesis: Secondary | ICD-10-CM | POA: Diagnosis not present

## 2022-02-24 DIAGNOSIS — M1712 Unilateral primary osteoarthritis, left knee: Secondary | ICD-10-CM | POA: Diagnosis not present

## 2022-02-25 DIAGNOSIS — M6281 Muscle weakness (generalized): Secondary | ICD-10-CM | POA: Diagnosis not present

## 2022-02-25 DIAGNOSIS — Z4733 Aftercare following explantation of knee joint prosthesis: Secondary | ICD-10-CM | POA: Diagnosis not present

## 2022-02-25 DIAGNOSIS — R2689 Other abnormalities of gait and mobility: Secondary | ICD-10-CM | POA: Diagnosis not present

## 2022-02-25 DIAGNOSIS — M25562 Pain in left knee: Secondary | ICD-10-CM | POA: Diagnosis not present

## 2022-03-01 ENCOUNTER — Other Ambulatory Visit: Payer: Self-pay

## 2022-03-01 DIAGNOSIS — E119 Type 2 diabetes mellitus without complications: Secondary | ICD-10-CM

## 2022-03-01 MED ORDER — SAXAGLIPTIN HCL 5 MG PO TABS: ORAL_TABLET | ORAL | 6 refills | Status: DC

## 2022-03-02 DIAGNOSIS — M25562 Pain in left knee: Secondary | ICD-10-CM | POA: Diagnosis not present

## 2022-03-02 DIAGNOSIS — R2689 Other abnormalities of gait and mobility: Secondary | ICD-10-CM | POA: Diagnosis not present

## 2022-03-02 DIAGNOSIS — M6281 Muscle weakness (generalized): Secondary | ICD-10-CM | POA: Diagnosis not present

## 2022-03-02 DIAGNOSIS — Z4733 Aftercare following explantation of knee joint prosthesis: Secondary | ICD-10-CM | POA: Diagnosis not present

## 2022-03-04 DIAGNOSIS — Z4733 Aftercare following explantation of knee joint prosthesis: Secondary | ICD-10-CM | POA: Diagnosis not present

## 2022-03-04 DIAGNOSIS — R2689 Other abnormalities of gait and mobility: Secondary | ICD-10-CM | POA: Diagnosis not present

## 2022-03-04 DIAGNOSIS — M6281 Muscle weakness (generalized): Secondary | ICD-10-CM | POA: Diagnosis not present

## 2022-03-04 DIAGNOSIS — M25562 Pain in left knee: Secondary | ICD-10-CM | POA: Diagnosis not present

## 2022-03-08 DIAGNOSIS — M6281 Muscle weakness (generalized): Secondary | ICD-10-CM | POA: Diagnosis not present

## 2022-03-08 DIAGNOSIS — M25562 Pain in left knee: Secondary | ICD-10-CM | POA: Diagnosis not present

## 2022-03-08 DIAGNOSIS — Z4733 Aftercare following explantation of knee joint prosthesis: Secondary | ICD-10-CM | POA: Diagnosis not present

## 2022-03-08 DIAGNOSIS — R2689 Other abnormalities of gait and mobility: Secondary | ICD-10-CM | POA: Diagnosis not present

## 2022-03-10 DIAGNOSIS — M1712 Unilateral primary osteoarthritis, left knee: Secondary | ICD-10-CM | POA: Diagnosis not present

## 2022-03-11 DIAGNOSIS — M6281 Muscle weakness (generalized): Secondary | ICD-10-CM | POA: Diagnosis not present

## 2022-03-11 DIAGNOSIS — Z4733 Aftercare following explantation of knee joint prosthesis: Secondary | ICD-10-CM | POA: Diagnosis not present

## 2022-03-11 DIAGNOSIS — M25562 Pain in left knee: Secondary | ICD-10-CM | POA: Diagnosis not present

## 2022-03-11 DIAGNOSIS — R2689 Other abnormalities of gait and mobility: Secondary | ICD-10-CM | POA: Diagnosis not present

## 2022-03-15 DIAGNOSIS — M6281 Muscle weakness (generalized): Secondary | ICD-10-CM | POA: Diagnosis not present

## 2022-03-15 DIAGNOSIS — M25562 Pain in left knee: Secondary | ICD-10-CM | POA: Diagnosis not present

## 2022-03-15 DIAGNOSIS — Z4733 Aftercare following explantation of knee joint prosthesis: Secondary | ICD-10-CM | POA: Diagnosis not present

## 2022-03-15 DIAGNOSIS — R2689 Other abnormalities of gait and mobility: Secondary | ICD-10-CM | POA: Diagnosis not present

## 2022-03-18 DIAGNOSIS — M6281 Muscle weakness (generalized): Secondary | ICD-10-CM | POA: Diagnosis not present

## 2022-03-18 DIAGNOSIS — M25562 Pain in left knee: Secondary | ICD-10-CM | POA: Diagnosis not present

## 2022-03-18 DIAGNOSIS — Z4733 Aftercare following explantation of knee joint prosthesis: Secondary | ICD-10-CM | POA: Diagnosis not present

## 2022-03-18 DIAGNOSIS — R2689 Other abnormalities of gait and mobility: Secondary | ICD-10-CM | POA: Diagnosis not present

## 2022-03-22 DIAGNOSIS — Z4733 Aftercare following explantation of knee joint prosthesis: Secondary | ICD-10-CM | POA: Diagnosis not present

## 2022-03-22 DIAGNOSIS — R2689 Other abnormalities of gait and mobility: Secondary | ICD-10-CM | POA: Diagnosis not present

## 2022-03-22 DIAGNOSIS — M25562 Pain in left knee: Secondary | ICD-10-CM | POA: Diagnosis not present

## 2022-03-22 DIAGNOSIS — M6281 Muscle weakness (generalized): Secondary | ICD-10-CM | POA: Diagnosis not present

## 2022-03-25 DIAGNOSIS — M25562 Pain in left knee: Secondary | ICD-10-CM | POA: Diagnosis not present

## 2022-03-25 DIAGNOSIS — Z4733 Aftercare following explantation of knee joint prosthesis: Secondary | ICD-10-CM | POA: Diagnosis not present

## 2022-03-25 DIAGNOSIS — M6281 Muscle weakness (generalized): Secondary | ICD-10-CM | POA: Diagnosis not present

## 2022-03-25 DIAGNOSIS — R2689 Other abnormalities of gait and mobility: Secondary | ICD-10-CM | POA: Diagnosis not present

## 2022-03-29 DIAGNOSIS — M6281 Muscle weakness (generalized): Secondary | ICD-10-CM | POA: Diagnosis not present

## 2022-03-29 DIAGNOSIS — Z4733 Aftercare following explantation of knee joint prosthesis: Secondary | ICD-10-CM | POA: Diagnosis not present

## 2022-03-29 DIAGNOSIS — M25562 Pain in left knee: Secondary | ICD-10-CM | POA: Diagnosis not present

## 2022-03-29 DIAGNOSIS — R2689 Other abnormalities of gait and mobility: Secondary | ICD-10-CM | POA: Diagnosis not present

## 2022-03-31 DIAGNOSIS — M25562 Pain in left knee: Secondary | ICD-10-CM | POA: Diagnosis not present

## 2022-03-31 DIAGNOSIS — R2689 Other abnormalities of gait and mobility: Secondary | ICD-10-CM | POA: Diagnosis not present

## 2022-03-31 DIAGNOSIS — Z4733 Aftercare following explantation of knee joint prosthesis: Secondary | ICD-10-CM | POA: Diagnosis not present

## 2022-03-31 DIAGNOSIS — M6281 Muscle weakness (generalized): Secondary | ICD-10-CM | POA: Diagnosis not present

## 2022-04-05 DIAGNOSIS — M25562 Pain in left knee: Secondary | ICD-10-CM | POA: Diagnosis not present

## 2022-04-05 DIAGNOSIS — M6281 Muscle weakness (generalized): Secondary | ICD-10-CM | POA: Diagnosis not present

## 2022-04-05 DIAGNOSIS — Z4733 Aftercare following explantation of knee joint prosthesis: Secondary | ICD-10-CM | POA: Diagnosis not present

## 2022-04-05 DIAGNOSIS — R2689 Other abnormalities of gait and mobility: Secondary | ICD-10-CM | POA: Diagnosis not present

## 2022-04-07 ENCOUNTER — Other Ambulatory Visit: Payer: Self-pay

## 2022-04-07 DIAGNOSIS — M25562 Pain in left knee: Secondary | ICD-10-CM | POA: Diagnosis not present

## 2022-04-07 DIAGNOSIS — M1712 Unilateral primary osteoarthritis, left knee: Secondary | ICD-10-CM | POA: Diagnosis not present

## 2022-04-07 DIAGNOSIS — M6281 Muscle weakness (generalized): Secondary | ICD-10-CM | POA: Diagnosis not present

## 2022-04-07 DIAGNOSIS — Z4733 Aftercare following explantation of knee joint prosthesis: Secondary | ICD-10-CM | POA: Diagnosis not present

## 2022-04-07 DIAGNOSIS — E038 Other specified hypothyroidism: Secondary | ICD-10-CM

## 2022-04-07 DIAGNOSIS — R2689 Other abnormalities of gait and mobility: Secondary | ICD-10-CM | POA: Diagnosis not present

## 2022-04-07 DIAGNOSIS — F339 Major depressive disorder, recurrent, unspecified: Secondary | ICD-10-CM

## 2022-04-07 MED ORDER — BUPROPION HCL ER (SR) 100 MG PO TB12: 100.0000 mg | ORAL_TABLET | Freq: Two times a day (BID) | ORAL | 1 refills | Status: DC

## 2022-04-07 MED ORDER — LEVOTHYROXINE SODIUM 150 MCG PO TABS: ORAL_TABLET | ORAL | 1 refills | Status: DC

## 2022-04-12 DIAGNOSIS — M25562 Pain in left knee: Secondary | ICD-10-CM | POA: Diagnosis not present

## 2022-04-12 DIAGNOSIS — M6281 Muscle weakness (generalized): Secondary | ICD-10-CM | POA: Diagnosis not present

## 2022-04-12 DIAGNOSIS — Z4733 Aftercare following explantation of knee joint prosthesis: Secondary | ICD-10-CM | POA: Diagnosis not present

## 2022-04-12 DIAGNOSIS — R2689 Other abnormalities of gait and mobility: Secondary | ICD-10-CM | POA: Diagnosis not present

## 2022-04-13 ENCOUNTER — Other Ambulatory Visit: Payer: Self-pay | Admitting: Family Medicine

## 2022-04-13 DIAGNOSIS — Z1231 Encounter for screening mammogram for malignant neoplasm of breast: Secondary | ICD-10-CM

## 2022-04-15 DIAGNOSIS — M25562 Pain in left knee: Secondary | ICD-10-CM | POA: Diagnosis not present

## 2022-04-15 DIAGNOSIS — M6281 Muscle weakness (generalized): Secondary | ICD-10-CM | POA: Diagnosis not present

## 2022-04-15 DIAGNOSIS — R2689 Other abnormalities of gait and mobility: Secondary | ICD-10-CM | POA: Diagnosis not present

## 2022-04-15 DIAGNOSIS — Z4733 Aftercare following explantation of knee joint prosthesis: Secondary | ICD-10-CM | POA: Diagnosis not present

## 2022-04-18 DIAGNOSIS — M6281 Muscle weakness (generalized): Secondary | ICD-10-CM | POA: Diagnosis not present

## 2022-04-18 DIAGNOSIS — M25562 Pain in left knee: Secondary | ICD-10-CM | POA: Diagnosis not present

## 2022-04-18 DIAGNOSIS — R2689 Other abnormalities of gait and mobility: Secondary | ICD-10-CM | POA: Diagnosis not present

## 2022-04-18 DIAGNOSIS — Z4733 Aftercare following explantation of knee joint prosthesis: Secondary | ICD-10-CM | POA: Diagnosis not present

## 2022-04-20 ENCOUNTER — Telehealth: Payer: Self-pay

## 2022-04-20 NOTE — Telephone Encounter (Signed)
Pt has appt upcoming on 6/26.  Will do eval and testing at that time

## 2022-04-21 NOTE — Telephone Encounter (Signed)
Informed pt that we will obtain labs on Monday the 26th @ her apt and then Dr Birdie Riddle can sign the form

## 2022-04-22 DIAGNOSIS — R2689 Other abnormalities of gait and mobility: Secondary | ICD-10-CM | POA: Diagnosis not present

## 2022-04-22 DIAGNOSIS — M6281 Muscle weakness (generalized): Secondary | ICD-10-CM | POA: Diagnosis not present

## 2022-04-22 DIAGNOSIS — M25562 Pain in left knee: Secondary | ICD-10-CM | POA: Diagnosis not present

## 2022-04-22 DIAGNOSIS — Z4733 Aftercare following explantation of knee joint prosthesis: Secondary | ICD-10-CM | POA: Diagnosis not present

## 2022-04-25 ENCOUNTER — Encounter: Payer: Self-pay | Admitting: Family Medicine

## 2022-04-25 ENCOUNTER — Ambulatory Visit (INDEPENDENT_AMBULATORY_CARE_PROVIDER_SITE_OTHER): Payer: Medicare HMO | Admitting: Family Medicine

## 2022-04-25 VITALS — BP 136/80 | HR 86 | Temp 98.4°F | Resp 16 | Ht 63.0 in | Wt 139.0 lb

## 2022-04-25 DIAGNOSIS — E785 Hyperlipidemia, unspecified: Secondary | ICD-10-CM

## 2022-04-25 DIAGNOSIS — M1711 Unilateral primary osteoarthritis, right knee: Secondary | ICD-10-CM

## 2022-04-25 DIAGNOSIS — Z01818 Encounter for other preprocedural examination: Secondary | ICD-10-CM | POA: Diagnosis not present

## 2022-04-25 DIAGNOSIS — E038 Other specified hypothyroidism: Secondary | ICD-10-CM | POA: Diagnosis not present

## 2022-04-25 DIAGNOSIS — E119 Type 2 diabetes mellitus without complications: Secondary | ICD-10-CM | POA: Diagnosis not present

## 2022-04-25 LAB — BASIC METABOLIC PANEL
BUN: 22 mg/dL (ref 6–23)
CO2: 26 mEq/L (ref 19–32)
Calcium: 10 mg/dL (ref 8.4–10.5)
Chloride: 97 mEq/L (ref 96–112)
Creatinine, Ser: 0.69 mg/dL (ref 0.40–1.20)
GFR: 87.39 mL/min (ref >60.00)
Glucose, Bld: 150 mg/dL — ABNORMAL HIGH (ref 70–99)
Potassium: 4.3 mEq/L (ref 3.5–5.1)
Sodium: 132 mEq/L — ABNORMAL LOW (ref 135–145)

## 2022-04-25 LAB — CBC WITH DIFFERENTIAL/PLATELET
Basophils Absolute: 0 10*3/uL (ref 0.0–0.1)
Basophils Relative: 0.4 % (ref 0.0–3.0)
Eosinophils Absolute: 0.2 10*3/uL (ref 0.0–0.7)
Eosinophils Relative: 2.1 % (ref 0.0–5.0)
HCT: 39.2 % (ref 36.0–46.0)
Hemoglobin: 13.1 g/dL (ref 12.0–15.0)
Lymphocytes Relative: 17.2 % (ref 12.0–46.0)
Lymphs Abs: 1.6 10*3/uL (ref 0.7–4.0)
MCHC: 33.4 g/dL (ref 30.0–36.0)
MCV: 90.4 fl (ref 78.0–100.0)
Monocytes Absolute: 0.9 10*3/uL (ref 0.1–1.0)
Monocytes Relative: 9.4 % (ref 3.0–12.0)
Neutro Abs: 6.5 10*3/uL (ref 1.4–7.7)
Neutrophils Relative %: 70.9 % (ref 43.0–77.0)
Platelets: 329 10*3/uL (ref 150.0–400.0)
RBC: 4.33 Mil/uL (ref 3.87–5.11)
RDW: 13.3 % (ref 11.5–15.5)
WBC: 9.1 10*3/uL (ref 4.0–10.5)

## 2022-04-25 LAB — LIPID PANEL
Cholesterol: 190 mg/dL (ref 0–200)
HDL: 75.6 mg/dL (ref >39.00)
LDL Cholesterol: 85 mg/dL (ref 0–99)
NonHDL: 114.14
Total CHOL/HDL Ratio: 3
Triglycerides: 147 mg/dL (ref 0.0–149.0)
VLDL: 29.4 mg/dL (ref 0.0–40.0)

## 2022-04-25 LAB — HEPATIC FUNCTION PANEL
ALT: 17 U/L (ref 0–35)
AST: 18 U/L (ref 0–37)
Albumin: 4.5 g/dL (ref 3.5–5.2)
Alkaline Phosphatase: 82 U/L (ref 39–117)
Bilirubin, Direct: 0.1 mg/dL (ref 0.0–0.3)
Total Bilirubin: 0.3 mg/dL (ref 0.2–1.2)
Total Protein: 7 g/dL (ref 6.0–8.3)

## 2022-04-25 LAB — TSH: TSH: 0.85 u[IU]/mL (ref 0.35–5.50)

## 2022-04-25 LAB — HEMOGLOBIN A1C: Hgb A1c MFr Bld: 7.6 % — ABNORMAL HIGH (ref 4.6–6.5)

## 2022-04-25 NOTE — Assessment & Plan Note (Signed)
Ongoing issue.  Last LDL 112.  She has been trying to avoid statins and control w/ diet and exercise.  Check labs and determine if medication needed

## 2022-04-26 DIAGNOSIS — M25562 Pain in left knee: Secondary | ICD-10-CM | POA: Diagnosis not present

## 2022-04-26 DIAGNOSIS — M6281 Muscle weakness (generalized): Secondary | ICD-10-CM | POA: Diagnosis not present

## 2022-04-26 DIAGNOSIS — Z4733 Aftercare following explantation of knee joint prosthesis: Secondary | ICD-10-CM | POA: Diagnosis not present

## 2022-04-26 DIAGNOSIS — R2689 Other abnormalities of gait and mobility: Secondary | ICD-10-CM | POA: Diagnosis not present

## 2022-04-26 MED ORDER — DICLOFENAC SODIUM 1 % EX GEL: 4.0000 g | Freq: Four times a day (QID) | CUTANEOUS | 3 refills | Status: DC

## 2022-04-26 MED ORDER — OMEPRAZOLE 20 MG PO CPDR: 20.0000 mg | DELAYED_RELEASE_CAPSULE | Freq: Every day | ORAL | 3 refills | Status: DC

## 2022-04-26 NOTE — Progress Notes (Signed)
Pt seen results Via my chart  

## 2022-04-29 DIAGNOSIS — R2689 Other abnormalities of gait and mobility: Secondary | ICD-10-CM | POA: Diagnosis not present

## 2022-04-29 DIAGNOSIS — M25562 Pain in left knee: Secondary | ICD-10-CM | POA: Diagnosis not present

## 2022-04-29 DIAGNOSIS — Z4733 Aftercare following explantation of knee joint prosthesis: Secondary | ICD-10-CM | POA: Diagnosis not present

## 2022-04-29 DIAGNOSIS — M6281 Muscle weakness (generalized): Secondary | ICD-10-CM | POA: Diagnosis not present

## 2022-05-04 DIAGNOSIS — M6281 Muscle weakness (generalized): Secondary | ICD-10-CM | POA: Diagnosis not present

## 2022-05-04 DIAGNOSIS — M25562 Pain in left knee: Secondary | ICD-10-CM | POA: Diagnosis not present

## 2022-05-04 DIAGNOSIS — R2689 Other abnormalities of gait and mobility: Secondary | ICD-10-CM | POA: Diagnosis not present

## 2022-05-04 DIAGNOSIS — Z4733 Aftercare following explantation of knee joint prosthesis: Secondary | ICD-10-CM | POA: Diagnosis not present

## 2022-05-06 DIAGNOSIS — M6281 Muscle weakness (generalized): Secondary | ICD-10-CM | POA: Diagnosis not present

## 2022-05-06 DIAGNOSIS — Z4733 Aftercare following explantation of knee joint prosthesis: Secondary | ICD-10-CM | POA: Diagnosis not present

## 2022-05-06 DIAGNOSIS — M25562 Pain in left knee: Secondary | ICD-10-CM | POA: Diagnosis not present

## 2022-05-06 DIAGNOSIS — R2689 Other abnormalities of gait and mobility: Secondary | ICD-10-CM | POA: Diagnosis not present

## 2022-05-11 DIAGNOSIS — Z4733 Aftercare following explantation of knee joint prosthesis: Secondary | ICD-10-CM | POA: Diagnosis not present

## 2022-05-11 DIAGNOSIS — M6281 Muscle weakness (generalized): Secondary | ICD-10-CM | POA: Diagnosis not present

## 2022-05-11 DIAGNOSIS — M25562 Pain in left knee: Secondary | ICD-10-CM | POA: Diagnosis not present

## 2022-05-11 DIAGNOSIS — R2689 Other abnormalities of gait and mobility: Secondary | ICD-10-CM | POA: Diagnosis not present

## 2022-05-13 DIAGNOSIS — Z4733 Aftercare following explantation of knee joint prosthesis: Secondary | ICD-10-CM | POA: Diagnosis not present

## 2022-05-13 DIAGNOSIS — R2689 Other abnormalities of gait and mobility: Secondary | ICD-10-CM | POA: Diagnosis not present

## 2022-05-13 DIAGNOSIS — M6281 Muscle weakness (generalized): Secondary | ICD-10-CM | POA: Diagnosis not present

## 2022-05-13 DIAGNOSIS — M25562 Pain in left knee: Secondary | ICD-10-CM | POA: Diagnosis not present

## 2022-05-17 NOTE — Telephone Encounter (Signed)
Surgical clearane form was faxed to [phone] for pt today . Copy has been sent to scan . I have the original .

## 2022-05-19 DIAGNOSIS — M1711 Unilateral primary osteoarthritis, right knee: Secondary | ICD-10-CM | POA: Diagnosis not present

## 2022-05-31 ENCOUNTER — Ambulatory Visit
Admission: RE | Admit: 2022-05-31 | Discharge: 2022-05-31 | Disposition: A | Discharge status: Home / Self Care | Payer: Medicare HMO | Source: Ambulatory Visit | Attending: Family Medicine | Admitting: Family Medicine

## 2022-05-31 DIAGNOSIS — Z1231 Encounter for screening mammogram for malignant neoplasm of breast: Secondary | ICD-10-CM | POA: Diagnosis not present

## 2022-06-02 ENCOUNTER — Other Ambulatory Visit: Payer: Self-pay | Admitting: Family Medicine

## 2022-06-02 DIAGNOSIS — R928 Other abnormal and inconclusive findings on diagnostic imaging of breast: Secondary | ICD-10-CM

## 2022-06-06 DIAGNOSIS — Z01812 Encounter for preprocedural laboratory examination: Secondary | ICD-10-CM | POA: Diagnosis not present

## 2022-06-06 DIAGNOSIS — M25561 Pain in right knee: Secondary | ICD-10-CM | POA: Diagnosis not present

## 2022-06-06 DIAGNOSIS — M1711 Unilateral primary osteoarthritis, right knee: Secondary | ICD-10-CM | POA: Diagnosis not present

## 2022-06-08 ENCOUNTER — Ambulatory Visit
Admission: RE | Admit: 2022-06-08 | Discharge: 2022-06-08 | Disposition: A | Discharge status: Home / Self Care | Payer: Medicare HMO | Source: Ambulatory Visit | Attending: Family Medicine | Admitting: Family Medicine

## 2022-06-08 DIAGNOSIS — R928 Other abnormal and inconclusive findings on diagnostic imaging of breast: Secondary | ICD-10-CM | POA: Diagnosis not present

## 2022-06-08 DIAGNOSIS — R921 Mammographic calcification found on diagnostic imaging of breast: Secondary | ICD-10-CM | POA: Diagnosis not present

## 2022-06-27 DIAGNOSIS — G8918 Other acute postprocedural pain: Secondary | ICD-10-CM | POA: Diagnosis not present

## 2022-06-27 DIAGNOSIS — M1711 Unilateral primary osteoarthritis, right knee: Secondary | ICD-10-CM | POA: Diagnosis not present

## 2022-06-28 ENCOUNTER — Other Ambulatory Visit: Payer: Self-pay

## 2022-06-28 DIAGNOSIS — E119 Type 2 diabetes mellitus without complications: Secondary | ICD-10-CM

## 2022-06-28 MED ORDER — SAXAGLIPTIN HCL 5 MG PO TABS: ORAL_TABLET | ORAL | 6 refills | Status: DC

## 2022-07-01 DIAGNOSIS — M1711 Unilateral primary osteoarthritis, right knee: Secondary | ICD-10-CM | POA: Diagnosis not present

## 2022-07-01 DIAGNOSIS — R2689 Other abnormalities of gait and mobility: Secondary | ICD-10-CM | POA: Diagnosis not present

## 2022-07-01 DIAGNOSIS — M6281 Muscle weakness (generalized): Secondary | ICD-10-CM | POA: Diagnosis not present

## 2022-07-01 DIAGNOSIS — Z471 Aftercare following joint replacement surgery: Secondary | ICD-10-CM | POA: Diagnosis not present

## 2022-07-01 HISTORY — PX: REPLACEMENT TOTAL KNEE: SUR1224

## 2022-07-05 DIAGNOSIS — R2689 Other abnormalities of gait and mobility: Secondary | ICD-10-CM | POA: Diagnosis not present

## 2022-07-05 DIAGNOSIS — M6281 Muscle weakness (generalized): Secondary | ICD-10-CM | POA: Diagnosis not present

## 2022-07-05 DIAGNOSIS — Z471 Aftercare following joint replacement surgery: Secondary | ICD-10-CM | POA: Diagnosis not present

## 2022-07-05 DIAGNOSIS — M1711 Unilateral primary osteoarthritis, right knee: Secondary | ICD-10-CM | POA: Diagnosis not present

## 2022-07-07 DIAGNOSIS — M1711 Unilateral primary osteoarthritis, right knee: Secondary | ICD-10-CM | POA: Diagnosis not present

## 2022-07-07 DIAGNOSIS — R2689 Other abnormalities of gait and mobility: Secondary | ICD-10-CM | POA: Diagnosis not present

## 2022-07-07 DIAGNOSIS — Z471 Aftercare following joint replacement surgery: Secondary | ICD-10-CM | POA: Diagnosis not present

## 2022-07-07 DIAGNOSIS — M6281 Muscle weakness (generalized): Secondary | ICD-10-CM | POA: Diagnosis not present

## 2022-07-12 DIAGNOSIS — M1711 Unilateral primary osteoarthritis, right knee: Secondary | ICD-10-CM | POA: Diagnosis not present

## 2022-07-12 DIAGNOSIS — Z471 Aftercare following joint replacement surgery: Secondary | ICD-10-CM | POA: Diagnosis not present

## 2022-07-12 DIAGNOSIS — R2689 Other abnormalities of gait and mobility: Secondary | ICD-10-CM | POA: Diagnosis not present

## 2022-07-12 DIAGNOSIS — M6281 Muscle weakness (generalized): Secondary | ICD-10-CM | POA: Diagnosis not present

## 2022-07-14 DIAGNOSIS — M1711 Unilateral primary osteoarthritis, right knee: Secondary | ICD-10-CM | POA: Diagnosis not present

## 2022-07-14 DIAGNOSIS — M6281 Muscle weakness (generalized): Secondary | ICD-10-CM | POA: Diagnosis not present

## 2022-07-14 DIAGNOSIS — Z471 Aftercare following joint replacement surgery: Secondary | ICD-10-CM | POA: Diagnosis not present

## 2022-07-14 DIAGNOSIS — R2689 Other abnormalities of gait and mobility: Secondary | ICD-10-CM | POA: Diagnosis not present

## 2022-07-19 DIAGNOSIS — M6281 Muscle weakness (generalized): Secondary | ICD-10-CM | POA: Diagnosis not present

## 2022-07-19 DIAGNOSIS — Z471 Aftercare following joint replacement surgery: Secondary | ICD-10-CM | POA: Diagnosis not present

## 2022-07-19 DIAGNOSIS — M1711 Unilateral primary osteoarthritis, right knee: Secondary | ICD-10-CM | POA: Diagnosis not present

## 2022-07-19 DIAGNOSIS — R2689 Other abnormalities of gait and mobility: Secondary | ICD-10-CM | POA: Diagnosis not present

## 2022-07-21 DIAGNOSIS — Z471 Aftercare following joint replacement surgery: Secondary | ICD-10-CM | POA: Diagnosis not present

## 2022-07-21 DIAGNOSIS — M6281 Muscle weakness (generalized): Secondary | ICD-10-CM | POA: Diagnosis not present

## 2022-07-21 DIAGNOSIS — M1711 Unilateral primary osteoarthritis, right knee: Secondary | ICD-10-CM | POA: Diagnosis not present

## 2022-07-21 DIAGNOSIS — R2689 Other abnormalities of gait and mobility: Secondary | ICD-10-CM | POA: Diagnosis not present

## 2022-07-26 DIAGNOSIS — M6281 Muscle weakness (generalized): Secondary | ICD-10-CM | POA: Diagnosis not present

## 2022-07-26 DIAGNOSIS — Z471 Aftercare following joint replacement surgery: Secondary | ICD-10-CM | POA: Diagnosis not present

## 2022-07-26 DIAGNOSIS — M1711 Unilateral primary osteoarthritis, right knee: Secondary | ICD-10-CM | POA: Diagnosis not present

## 2022-07-26 DIAGNOSIS — R2689 Other abnormalities of gait and mobility: Secondary | ICD-10-CM | POA: Diagnosis not present

## 2022-07-28 DIAGNOSIS — R2689 Other abnormalities of gait and mobility: Secondary | ICD-10-CM | POA: Diagnosis not present

## 2022-07-28 DIAGNOSIS — Z471 Aftercare following joint replacement surgery: Secondary | ICD-10-CM | POA: Diagnosis not present

## 2022-07-28 DIAGNOSIS — M1711 Unilateral primary osteoarthritis, right knee: Secondary | ICD-10-CM | POA: Diagnosis not present

## 2022-07-28 DIAGNOSIS — M6281 Muscle weakness (generalized): Secondary | ICD-10-CM | POA: Diagnosis not present

## 2022-08-02 DIAGNOSIS — M6281 Muscle weakness (generalized): Secondary | ICD-10-CM | POA: Diagnosis not present

## 2022-08-02 DIAGNOSIS — M1711 Unilateral primary osteoarthritis, right knee: Secondary | ICD-10-CM | POA: Diagnosis not present

## 2022-08-02 DIAGNOSIS — Z471 Aftercare following joint replacement surgery: Secondary | ICD-10-CM | POA: Diagnosis not present

## 2022-08-02 DIAGNOSIS — R2689 Other abnormalities of gait and mobility: Secondary | ICD-10-CM | POA: Diagnosis not present

## 2022-08-03 ENCOUNTER — Encounter: Payer: Self-pay | Admitting: Family Medicine

## 2022-08-04 DIAGNOSIS — M1711 Unilateral primary osteoarthritis, right knee: Secondary | ICD-10-CM | POA: Diagnosis not present

## 2022-08-04 DIAGNOSIS — Z471 Aftercare following joint replacement surgery: Secondary | ICD-10-CM | POA: Diagnosis not present

## 2022-08-04 DIAGNOSIS — R2689 Other abnormalities of gait and mobility: Secondary | ICD-10-CM | POA: Diagnosis not present

## 2022-08-04 DIAGNOSIS — M6281 Muscle weakness (generalized): Secondary | ICD-10-CM | POA: Diagnosis not present

## 2022-08-08 DIAGNOSIS — M1611 Unilateral primary osteoarthritis, right hip: Secondary | ICD-10-CM | POA: Diagnosis not present

## 2022-08-09 DIAGNOSIS — M1711 Unilateral primary osteoarthritis, right knee: Secondary | ICD-10-CM | POA: Diagnosis not present

## 2022-08-09 DIAGNOSIS — Z471 Aftercare following joint replacement surgery: Secondary | ICD-10-CM | POA: Diagnosis not present

## 2022-08-09 DIAGNOSIS — R2689 Other abnormalities of gait and mobility: Secondary | ICD-10-CM | POA: Diagnosis not present

## 2022-08-09 DIAGNOSIS — M6281 Muscle weakness (generalized): Secondary | ICD-10-CM | POA: Diagnosis not present

## 2022-08-11 DIAGNOSIS — M1711 Unilateral primary osteoarthritis, right knee: Secondary | ICD-10-CM | POA: Diagnosis not present

## 2022-08-11 DIAGNOSIS — R2689 Other abnormalities of gait and mobility: Secondary | ICD-10-CM | POA: Diagnosis not present

## 2022-08-11 DIAGNOSIS — Z471 Aftercare following joint replacement surgery: Secondary | ICD-10-CM | POA: Diagnosis not present

## 2022-08-11 DIAGNOSIS — M6281 Muscle weakness (generalized): Secondary | ICD-10-CM | POA: Diagnosis not present

## 2022-08-29 ENCOUNTER — Encounter: Payer: Self-pay | Admitting: Family Medicine

## 2022-08-29 ENCOUNTER — Ambulatory Visit (INDEPENDENT_AMBULATORY_CARE_PROVIDER_SITE_OTHER): Payer: Medicare HMO | Admitting: Family Medicine

## 2022-08-29 VITALS — BP 134/86 | HR 72 | Temp 98.8°F | Resp 17 | Ht 63.0 in | Wt 134.5 lb

## 2022-08-29 DIAGNOSIS — E119 Type 2 diabetes mellitus without complications: Secondary | ICD-10-CM

## 2022-08-29 DIAGNOSIS — Z0000 Encounter for general adult medical examination without abnormal findings: Secondary | ICD-10-CM

## 2022-08-29 LAB — CBC WITH DIFFERENTIAL/PLATELET
Basophils Absolute: 0 10*3/uL (ref 0.0–0.1)
Basophils Relative: 0.6 % (ref 0.0–3.0)
Eosinophils Absolute: 0.2 10*3/uL (ref 0.0–0.7)
Eosinophils Relative: 2.2 % (ref 0.0–5.0)
HCT: 40.2 % (ref 36.0–46.0)
Hemoglobin: 13.1 g/dL (ref 12.0–15.0)
Lymphocytes Relative: 20.4 % (ref 12.0–46.0)
Lymphs Abs: 1.6 10*3/uL (ref 0.7–4.0)
MCHC: 32.6 g/dL (ref 30.0–36.0)
MCV: 89.3 fl (ref 78.0–100.0)
Monocytes Absolute: 0.7 10*3/uL (ref 0.1–1.0)
Monocytes Relative: 9.1 % (ref 3.0–12.0)
Neutro Abs: 5.4 10*3/uL (ref 1.4–7.7)
Neutrophils Relative %: 67.7 % (ref 43.0–77.0)
Platelets: 333 10*3/uL (ref 150.0–400.0)
RBC: 4.5 Mil/uL (ref 3.87–5.11)
RDW: 13.3 % (ref 11.5–15.5)
WBC: 8 10*3/uL (ref 4.0–10.5)

## 2022-08-29 LAB — LIPID PANEL
Cholesterol: 214 mg/dL — ABNORMAL HIGH (ref 0–200)
HDL: 80.2 mg/dL (ref >39.00)
LDL Cholesterol: 114 mg/dL — ABNORMAL HIGH (ref 0–99)
NonHDL: 133.76
Total CHOL/HDL Ratio: 3
Triglycerides: 100 mg/dL (ref 0.0–149.0)
VLDL: 20 mg/dL (ref 0.0–40.0)

## 2022-08-29 LAB — BASIC METABOLIC PANEL
BUN: 21 mg/dL (ref 6–23)
CO2: 25 mEq/L (ref 19–32)
Calcium: 10.1 mg/dL (ref 8.4–10.5)
Chloride: 97 mEq/L (ref 96–112)
Creatinine, Ser: 0.68 mg/dL (ref 0.40–1.20)
GFR: 87.48 mL/min (ref >60.00)
Glucose, Bld: 190 mg/dL — ABNORMAL HIGH (ref 70–99)
Potassium: 5 mEq/L (ref 3.5–5.1)
Sodium: 131 mEq/L — ABNORMAL LOW (ref 135–145)

## 2022-08-29 LAB — HEPATIC FUNCTION PANEL
ALT: 15 U/L (ref 0–35)
AST: 16 U/L (ref 0–37)
Albumin: 4.5 g/dL (ref 3.5–5.2)
Alkaline Phosphatase: 89 U/L (ref 39–117)
Bilirubin, Direct: 0.1 mg/dL (ref 0.0–0.3)
Total Bilirubin: 0.3 mg/dL (ref 0.2–1.2)
Total Protein: 7.1 g/dL (ref 6.0–8.3)

## 2022-08-29 LAB — TSH: TSH: 0.92 u[IU]/mL (ref 0.35–5.50)

## 2022-08-29 LAB — HEMOGLOBIN A1C: Hgb A1c MFr Bld: 7.8 % — ABNORMAL HIGH (ref 4.6–6.5)

## 2022-08-29 NOTE — Progress Notes (Signed)
   Subjective:    Patient ID: Ashley Shaffer, female    DOB: 03-04-1951, 71 y.o.   MRN: [id number]  HPI CPE- UTD on colonoscopy, foot exam, mammogram.  Due for eye exam, microalbumin.  Patient Care Team    Relationship Specialty Notifications Start End  Midge Minium, MD PCP - General Family Medicine  01/22/16   Nunzio Cobbs, MD Consulting Physician Obstetrics and Gynecology  04/24/20      Health Maintenance  Topic Date Due   Medicare Annual Wellness (AWV)  Never done   OPHTHALMOLOGY EXAM  05/11/2022   Diabetic kidney evaluation - Urine ACR  07/15/2022   HEMOGLOBIN A1C  10/25/2022   Diabetic kidney evaluation - GFR measurement  04/26/2023   COLONOSCOPY (Pts 45-61yr Insurance coverage will need to be confirmed)  07/02/2023   FOOT EXAM  08/30/2023   MAMMOGRAM  06/08/2024   Pneumonia Vaccine 71 Years old  Completed   INFLUENZA VACCINE  Completed   DEXA SCAN  Completed   Hepatitis C Screening  Completed   Zoster Vaccines- Shingrix  Completed   HPV VACCINES  Aged Out   TETANUS/TDAP  Discontinued   COVID-19 Vaccine  Discontinued      Review of Systems Patient reports no vision/ hearing changes, adenopathy,fever, weight change,  persistant/recurrent hoarseness , swallowing issues, chest pain, palpitations, edema, persistant/recurrent cough, hemoptysis, dyspnea (rest/exertional/paroxysmal nocturnal), gastrointestinal bleeding (melena, rectal bleeding), abdominal pain, significant heartburn, bowel changes, GU symptoms (dysuria, hematuria, incontinence), Gyn symptoms (abnormal  bleeding, pain),  syncope, focal weakness, memory loss, numbness & tingling, skin/hair/nail changes, abnormal bruising or bleeding, anxiety, or depression.     Objective:   Physical Exam General Appearance:    Alert, cooperative, no distress, appears stated age  Head:    Normocephalic, without obvious abnormality, atraumatic  Eyes:    PERRL, conjunctiva/corneas clear, EOM's intact both  eyes  Ears:    Normal TM's and external ear canals, both ears  Nose:   Nares normal, septum midline, mucosa normal, no drainage    or sinus tenderness  Throat:   Lips, mucosa, and tongue normal; teeth and gums normal  Neck:   Supple, symmetrical, trachea midline, no adenopathy;    Thyroid: no enlargement/tenderness/nodules  Back:     Symmetric, no curvature, ROM normal, no CVA tenderness  Lungs:     Clear to auscultation bilaterally, respirations unlabored  Chest Wall:    No tenderness or deformity   Heart:    Regular rate and rhythm, S1 and S2 normal, no murmur, rub   or gallop  Breast Exam:    Deferred to mammo  Abdomen:     Soft, non-tender, bowel sounds active all four quadrants,    no masses, no organomegaly  Genitalia:    Deferred to GYN  Rectal:    Extremities:   Extremities normal, atraumatic, no cyanosis or edema  Pulses:   2+ and symmetric all extremities  Skin:   Skin color, texture, turgor normal, no rashes or lesions  Lymph nodes:   Cervical, supraclavicular, and axillary nodes normal  Neurologic:   CNII-XII intact, normal strength, sensation and reflexes    throughout          Assessment & Plan:

## 2022-08-29 NOTE — Assessment & Plan Note (Signed)
Pt's PE WNL.  UTD on colonoscopy, mammogram, immunizations.  Check labs.  Anticipatory guidance provided.

## 2022-08-29 NOTE — Patient Instructions (Signed)
Follow up in 3-4 months to recheck sugar We'll notify you of your lab results and make any changes if needed Schedule your eye exam at your convenience and have them send me a copy of the report Keep up the good work!  You look great! Call with any questions or concerns Stay Safe!  Stay Healthy! Happy Fall!!

## 2022-08-29 NOTE — Assessment & Plan Note (Signed)
Chronic problem.  Hx of good control.  UTD on foot exam.  Due for microalbumin (ordered) and eye exam (pt to schedule).  Check labs.  Adjust meds prn

## 2022-08-30 ENCOUNTER — Encounter: Payer: Self-pay | Admitting: Family Medicine

## 2022-08-30 NOTE — Progress Notes (Signed)
Pt seen results Via my chart  

## 2022-09-08 DIAGNOSIS — M1711 Unilateral primary osteoarthritis, right knee: Secondary | ICD-10-CM | POA: Diagnosis not present

## 2022-09-14 DIAGNOSIS — M6281 Muscle weakness (generalized): Secondary | ICD-10-CM | POA: Diagnosis not present

## 2022-09-14 DIAGNOSIS — R2689 Other abnormalities of gait and mobility: Secondary | ICD-10-CM | POA: Diagnosis not present

## 2022-09-14 DIAGNOSIS — M25551 Pain in right hip: Secondary | ICD-10-CM | POA: Diagnosis not present

## 2022-09-16 DIAGNOSIS — M25551 Pain in right hip: Secondary | ICD-10-CM | POA: Diagnosis not present

## 2022-09-16 DIAGNOSIS — R2689 Other abnormalities of gait and mobility: Secondary | ICD-10-CM | POA: Diagnosis not present

## 2022-09-16 DIAGNOSIS — M6281 Muscle weakness (generalized): Secondary | ICD-10-CM | POA: Diagnosis not present

## 2022-09-19 DIAGNOSIS — M25551 Pain in right hip: Secondary | ICD-10-CM | POA: Diagnosis not present

## 2022-09-19 DIAGNOSIS — M6281 Muscle weakness (generalized): Secondary | ICD-10-CM | POA: Diagnosis not present

## 2022-09-19 DIAGNOSIS — R2689 Other abnormalities of gait and mobility: Secondary | ICD-10-CM | POA: Diagnosis not present

## 2022-09-21 DIAGNOSIS — M6281 Muscle weakness (generalized): Secondary | ICD-10-CM | POA: Diagnosis not present

## 2022-09-21 DIAGNOSIS — R2689 Other abnormalities of gait and mobility: Secondary | ICD-10-CM | POA: Diagnosis not present

## 2022-09-21 DIAGNOSIS — M25551 Pain in right hip: Secondary | ICD-10-CM | POA: Diagnosis not present

## 2022-09-26 ENCOUNTER — Other Ambulatory Visit: Payer: Self-pay | Admitting: Family Medicine

## 2022-09-26 DIAGNOSIS — E038 Other specified hypothyroidism: Secondary | ICD-10-CM

## 2022-09-27 DIAGNOSIS — M6281 Muscle weakness (generalized): Secondary | ICD-10-CM | POA: Diagnosis not present

## 2022-09-27 DIAGNOSIS — M25551 Pain in right hip: Secondary | ICD-10-CM | POA: Diagnosis not present

## 2022-09-27 DIAGNOSIS — R2689 Other abnormalities of gait and mobility: Secondary | ICD-10-CM | POA: Diagnosis not present

## 2022-09-29 DIAGNOSIS — M25551 Pain in right hip: Secondary | ICD-10-CM | POA: Diagnosis not present

## 2022-09-29 DIAGNOSIS — M6281 Muscle weakness (generalized): Secondary | ICD-10-CM | POA: Diagnosis not present

## 2022-09-29 DIAGNOSIS — R2689 Other abnormalities of gait and mobility: Secondary | ICD-10-CM | POA: Diagnosis not present

## 2022-10-02 ENCOUNTER — Encounter: Payer: Self-pay | Admitting: Family Medicine

## 2022-10-03 DIAGNOSIS — Z0100 Encounter for examination of eyes and vision without abnormal findings: Secondary | ICD-10-CM | POA: Diagnosis not present

## 2022-10-03 DIAGNOSIS — H524 Presbyopia: Secondary | ICD-10-CM | POA: Diagnosis not present

## 2022-10-04 DIAGNOSIS — R2689 Other abnormalities of gait and mobility: Secondary | ICD-10-CM | POA: Diagnosis not present

## 2022-10-04 DIAGNOSIS — M25551 Pain in right hip: Secondary | ICD-10-CM | POA: Diagnosis not present

## 2022-10-04 DIAGNOSIS — M6281 Muscle weakness (generalized): Secondary | ICD-10-CM | POA: Diagnosis not present

## 2022-10-04 MED ORDER — HYDROCORTISONE 2.5 % EX CREA: TOPICAL_CREAM | Freq: Two times a day (BID) | CUTANEOUS | 1 refills | Status: DC

## 2022-10-05 ENCOUNTER — Encounter: Payer: Self-pay | Admitting: Family Medicine

## 2022-10-05 ENCOUNTER — Ambulatory Visit (INDEPENDENT_AMBULATORY_CARE_PROVIDER_SITE_OTHER): Payer: Medicare HMO | Admitting: Family Medicine

## 2022-10-05 VITALS — BP 124/80 | HR 71 | Temp 97.2°F | Resp 18 | Ht 63.0 in | Wt 135.1 lb

## 2022-10-05 DIAGNOSIS — M2550 Pain in unspecified joint: Secondary | ICD-10-CM

## 2022-10-05 DIAGNOSIS — R21 Rash and other nonspecific skin eruption: Secondary | ICD-10-CM | POA: Diagnosis not present

## 2022-10-05 MED ORDER — SAXAGLIPTIN HCL 2.5 MG PO TABS
5.0000 mg | ORAL_TABLET | Freq: Every day | ORAL | 1 refills | Status: DC
Start: 2022-10-05 — End: 2022-10-10

## 2022-10-05 MED ORDER — PREDNISONE 10 MG PO TABS: ORAL_TABLET | ORAL | 0 refills | Status: DC

## 2022-10-05 NOTE — Patient Instructions (Signed)
Follow up as needed or as scheduled START the Prednisone as directed- take w/ food.  3 tabs at the same time x3 days, then 2 tabs x3 days, then 1 tab daily USE the Hydrocortisone cream as needed for itching Watch your carb and sugar intake while on the Prednisone Call with any questions or concerns Stay Safe!  Stay Healthy! Hang in there! Happy Holidays!!!

## 2022-10-05 NOTE — Progress Notes (Signed)
   Subjective:    Patient ID: Ashley Shaffer, female    DOB: Sep 09, 1951, 71 y.o.   MRN: [id number]  HPI Itching- sxs started Sunday night.  Original sxs started ~6 months ago in scalp.  Has been using psoriasis shampoo and scalpicin w/ relief.  Now having itching of back, R hip, L breast.  No itching of hands or feet.  No changes to lotion, detergent, skin care.  Has been spending time at the barn but was not there on Sunday.  Husband doesn't have similar rash or itching.  Polyarthralgia- pt reports pain and arthritis is worsening.  She notes increased swelling and deformity of finger joints   Review of Systems For ROS see HPI     Objective:   Physical Exam Vitals reviewed.  Constitutional:      General: She is not in acute distress.    Appearance: Normal appearance. She is not ill-appearing.  HENT:     Head: Normocephalic and atraumatic.  Musculoskeletal:        General: Swelling (of DIP and PIP joints bilaterally), tenderness and deformity (nodularity of DIP and PIP joints bilaterally) present.  Skin:    General: Skin is warm and dry.     Findings: Rash (erythematous, maculopapular rash that is confluent in areas to resemble hives- 2 areas on back, 1 on R hip and one on L breast) present.  Neurological:     General: No focal deficit present.     Mental Status: She is alert and oriented to person, place, and time.  Psychiatric:        Mood and Affect: Mood normal.        Behavior: Behavior normal.        Thought Content: Thought content normal.           Assessment & Plan:   Rash- new.  Looks like contact dermatitis of some sort.  Pt can't think of any new exposures but has spent time in the barn w/ her horse.  Hydrocortisone 2.5% has already been sent to the pharmacy for pt to use.  After discussion about her sugars/diabetes, we decided to do a low dose prednisone taper to improve sxs.  Reviewed supportive care and red flags that should prompt return.  Pt expressed  understanding and is in agreement w/ plan.   Polyarthralgia- new.  Pt would like to be tested for RA given her worsening sxs.  Discussed that this is likely non-inflammatory OA but we can certainly check to r/o autoimmune process.

## 2022-10-06 ENCOUNTER — Telehealth: Payer: Self-pay

## 2022-10-06 NOTE — Telephone Encounter (Signed)
Pt notes started high dose prednisone yesterday she feels better but no visual improvement at this time.  No comment required she intended this as an [place]

## 2022-10-06 NOTE — Telephone Encounter (Signed)
-----   Message from Midge Minium, MD sent at 10/06/2022  7:45 AM EST ----- No evidence of rheumatoid arthritis

## 2022-10-07 DIAGNOSIS — R2689 Other abnormalities of gait and mobility: Secondary | ICD-10-CM | POA: Diagnosis not present

## 2022-10-07 DIAGNOSIS — M25551 Pain in right hip: Secondary | ICD-10-CM | POA: Diagnosis not present

## 2022-10-07 DIAGNOSIS — M6281 Muscle weakness (generalized): Secondary | ICD-10-CM | POA: Diagnosis not present

## 2022-10-07 LAB — ANTI-NUCLEAR AB-TITER (ANA TITER)
ANA TITER: 1:40 {titer} — ABNORMAL HIGH
ANA Titer 1: 1:40 {titer} — ABNORMAL HIGH

## 2022-10-07 LAB — RHEUMATOID FACTOR: Rheumatoid fact SerPl-aCnc: <14 IU/mL (ref <14)

## 2022-10-07 LAB — ANA: Anti Nuclear Antibody (ANA): POSITIVE — AB

## 2022-10-09 ENCOUNTER — Other Ambulatory Visit: Payer: Self-pay | Admitting: Family Medicine

## 2022-10-10 ENCOUNTER — Telehealth: Payer: Self-pay

## 2022-10-10 ENCOUNTER — Encounter: Payer: Self-pay | Admitting: Family Medicine

## 2022-10-10 ENCOUNTER — Other Ambulatory Visit (HOSPITAL_COMMUNITY): Payer: Self-pay

## 2022-10-10 ENCOUNTER — Other Ambulatory Visit: Payer: Self-pay

## 2022-10-10 DIAGNOSIS — R768 Other specified abnormal immunological findings in serum: Secondary | ICD-10-CM

## 2022-10-10 NOTE — Telephone Encounter (Signed)
Pharmacy sent a prior auth for pt Saxagliptin 2.5 mg

## 2022-10-10 NOTE — Telephone Encounter (Signed)
-----   Message from Midge Minium, MD sent at 10/09/2022  9:09 AM EST ----- Your ANA (a marker of possible autoimmune issues) is very faintly positive.  Given your worsening joint symptoms, we will refer you to Rheumatology for a complete evaluation (they are quite backed up so this may take awhile- just wanted you to know).  Refer rheumatology- dx positive ANA

## 2022-10-10 NOTE — Telephone Encounter (Signed)
Pharmacy Patient Advocate Encounter   Received notification from [facility] that prior authorization for sAXagliptin HCl 2.'5MG'$  tablets is required/requested.   PA not submitted. Quantity limit for this drug is 30 tablets per 30 days. Ran test claim for '5mg'$ , 1 tablet per day, no PA needed.  Key [id number]

## 2022-10-10 NOTE — Telephone Encounter (Signed)
Informed pt of lab results and Rheumatology referral has been placed pt is aware it may be a few months before she can be seen . She expressed verbal understanding

## 2022-10-11 ENCOUNTER — Other Ambulatory Visit (HOSPITAL_COMMUNITY): Payer: Self-pay

## 2022-10-11 ENCOUNTER — Other Ambulatory Visit: Payer: Self-pay

## 2022-10-11 ENCOUNTER — Telehealth: Payer: Self-pay

## 2022-10-11 DIAGNOSIS — M6281 Muscle weakness (generalized): Secondary | ICD-10-CM | POA: Diagnosis not present

## 2022-10-11 DIAGNOSIS — M25551 Pain in right hip: Secondary | ICD-10-CM | POA: Diagnosis not present

## 2022-10-11 DIAGNOSIS — R2689 Other abnormalities of gait and mobility: Secondary | ICD-10-CM | POA: Diagnosis not present

## 2022-10-11 MED ORDER — SAXAGLIPTIN HCL 2.5 MG PO TABS: ORAL_TABLET | ORAL | 1 refills | Status: DC

## 2022-10-11 NOTE — Telephone Encounter (Signed)
You guys submitted a PA for the 5 mg on this medication is out of stock backorder by manufacturer we sent in 2.5 mg which they have so we need a PA for that obtained please

## 2022-10-11 NOTE — Telephone Encounter (Signed)
Pharmacy Patient Advocate Encounter  Received notification from [facility] that prior authorization is required for sAXagliptin HCl 2.'5MG'$  tablets. PA submitted and APPROVED via phone on 10/11/22.  Ref# [id number] Auth# [id number] Effective: 10/31/21 - 10/31/23

## 2022-10-11 NOTE — Telephone Encounter (Signed)
The '5mg'$  dose is unavailable (on backorder) at the pharmacy.  They told her she would need to switch to 2.'5mg'$ - 2 tabs- as this is in stock

## 2022-10-11 NOTE — Telephone Encounter (Signed)
Pharmacy Patient Advocate Encounter   Received notification from [facility] that prior authorization is required for sAXagliptin HCl 2.'5MG'$  tablets. PA submitted and APPROVED via phone on 10/11/22.   Ref# [id number] Auth# [id number] Effective: 10/31/21 - 10/31/23

## 2022-10-11 NOTE — Telephone Encounter (Signed)
Per pharmacy team the medication is only covered once a day in the 5 mg

## 2022-10-11 NOTE — Telephone Encounter (Signed)
Spoke to the pharmacist at [facility] and informed them that insurance will only approve a 30 day supply and I left that info on pt 's VM as well

## 2022-10-13 DIAGNOSIS — M25551 Pain in right hip: Secondary | ICD-10-CM | POA: Diagnosis not present

## 2022-10-14 ENCOUNTER — Other Ambulatory Visit: Payer: Self-pay | Admitting: Family Medicine

## 2022-10-14 DIAGNOSIS — F339 Major depressive disorder, recurrent, unspecified: Secondary | ICD-10-CM

## 2022-10-14 DIAGNOSIS — M6281 Muscle weakness (generalized): Secondary | ICD-10-CM | POA: Diagnosis not present

## 2022-10-14 DIAGNOSIS — R2689 Other abnormalities of gait and mobility: Secondary | ICD-10-CM | POA: Diagnosis not present

## 2022-10-14 DIAGNOSIS — M25551 Pain in right hip: Secondary | ICD-10-CM | POA: Diagnosis not present

## 2022-10-18 DIAGNOSIS — R2689 Other abnormalities of gait and mobility: Secondary | ICD-10-CM | POA: Diagnosis not present

## 2022-10-18 DIAGNOSIS — M545 Low back pain, unspecified: Secondary | ICD-10-CM | POA: Diagnosis not present

## 2022-10-18 DIAGNOSIS — M6281 Muscle weakness (generalized): Secondary | ICD-10-CM | POA: Diagnosis not present

## 2022-10-18 DIAGNOSIS — M25551 Pain in right hip: Secondary | ICD-10-CM | POA: Diagnosis not present

## 2022-10-19 DIAGNOSIS — L718 Other rosacea: Secondary | ICD-10-CM | POA: Diagnosis not present

## 2022-10-19 DIAGNOSIS — L258 Unspecified contact dermatitis due to other agents: Secondary | ICD-10-CM | POA: Diagnosis not present

## 2022-10-20 DIAGNOSIS — R2689 Other abnormalities of gait and mobility: Secondary | ICD-10-CM | POA: Diagnosis not present

## 2022-10-20 DIAGNOSIS — M6281 Muscle weakness (generalized): Secondary | ICD-10-CM | POA: Diagnosis not present

## 2022-10-20 DIAGNOSIS — M25551 Pain in right hip: Secondary | ICD-10-CM | POA: Diagnosis not present

## 2022-11-03 DIAGNOSIS — M545 Low back pain, unspecified: Secondary | ICD-10-CM | POA: Diagnosis not present

## 2022-11-08 DIAGNOSIS — M545 Low back pain, unspecified: Secondary | ICD-10-CM | POA: Diagnosis not present

## 2022-11-11 ENCOUNTER — Telehealth: Payer: Self-pay | Admitting: Family Medicine

## 2022-11-11 ENCOUNTER — Other Ambulatory Visit: Payer: Self-pay

## 2022-11-11 MED ORDER — METFORMIN HCL ER 500 MG PO TB24: ORAL_TABLET | ORAL | 1 refills | Status: DC

## 2022-11-11 NOTE — Telephone Encounter (Signed)
Encourage patient to contact the pharmacy for refills or they can request refills through [facility]  (Please schedule appointment if patient has not been seen in over a year)   Last ov 10/05/22 WHAT PHARMACY WOULD THEY LIKE THIS SENT TO:  [id number] - [place], [place] - [address]    MEDICATION NAME & DOSE: metFORMIN (GLUCOPHAGE-XR) 500 MG 24 hr tablet   NOTES/COMMENTS FROM PATIENT:      [place] office please notify patient: It takes 48-72 hours to process rx refill requests Ask patient to call pharmacy to ensure rx is ready before heading there.

## 2022-11-11 NOTE — Telephone Encounter (Signed)
Rx sent to pharmacy

## 2022-11-15 ENCOUNTER — Encounter: Payer: Medicare HMO | Admitting: Family Medicine

## 2022-11-25 DIAGNOSIS — R29898 Other symptoms and signs involving the musculoskeletal system: Secondary | ICD-10-CM | POA: Diagnosis not present

## 2022-11-25 DIAGNOSIS — M419 Scoliosis, unspecified: Secondary | ICD-10-CM | POA: Diagnosis not present

## 2022-11-30 ENCOUNTER — Encounter: Payer: Self-pay | Admitting: Family Medicine

## 2022-11-30 ENCOUNTER — Ambulatory Visit (INDEPENDENT_AMBULATORY_CARE_PROVIDER_SITE_OTHER): Payer: Medicare HMO | Admitting: Family Medicine

## 2022-11-30 VITALS — BP 112/68 | HR 69 | Temp 98.4°F | Resp 16 | Ht 63.0 in | Wt 137.2 lb

## 2022-11-30 DIAGNOSIS — E119 Type 2 diabetes mellitus without complications: Secondary | ICD-10-CM

## 2022-11-30 DIAGNOSIS — R42 Dizziness and giddiness: Secondary | ICD-10-CM

## 2022-11-30 DIAGNOSIS — R262 Difficulty in walking, not elsewhere classified: Secondary | ICD-10-CM | POA: Diagnosis not present

## 2022-11-30 DIAGNOSIS — M6281 Muscle weakness (generalized): Secondary | ICD-10-CM | POA: Diagnosis not present

## 2022-11-30 LAB — BASIC METABOLIC PANEL
BUN: 16 mg/dL (ref 6–23)
CO2: 29 mEq/L (ref 19–32)
Calcium: 9.8 mg/dL (ref 8.4–10.5)
Chloride: 97 mEq/L (ref 96–112)
Creatinine, Ser: 0.66 mg/dL (ref 0.40–1.20)
GFR: 87.96 mL/min (ref >60.00)
Glucose, Bld: 179 mg/dL — ABNORMAL HIGH (ref 70–99)
Potassium: 4.3 mEq/L (ref 3.5–5.1)
Sodium: 133 mEq/L — ABNORMAL LOW (ref 135–145)

## 2022-11-30 LAB — MICROALBUMIN / CREATININE URINE RATIO
Creatinine,U: 66.1 mg/dL
Microalb Creat Ratio: 5.4 mg/g (ref 0.0–30.0)
Microalb, Ur: 3.5 mg/dL — ABNORMAL HIGH (ref 0.0–1.9)

## 2022-11-30 LAB — HEMOGLOBIN A1C: Hgb A1c MFr Bld: 8.6 % — ABNORMAL HIGH (ref 4.6–6.5)

## 2022-11-30 NOTE — Patient Instructions (Addendum)
Follow up in 3-4 months to recheck sugar, cholesterol We'll notify you of your lab results and make any changes  Keep up the good work on healthy diet and regular exercise- you're doing great! We'll call you to schedule your vestibular rehab Continue to drink LOTS of fluids Call with any questions or concerns Stay Safe!  Stay Healthy!

## 2022-11-30 NOTE — Progress Notes (Signed)
   Subjective:    Patient ID: Ashley Shaffer, female    DOB: [birth year], 72 y.o.   MRN: [id number]  HPI DM- chronic problem, on Onglyza '5mg'$ , Metformin XR '1000mg'$  daily.  Last A1C 7.8%.  UTD on eye exam, foot exam.  No CP, SOB, HA's, visual changes.  Denies symptomatic lows.  No numbness/tingling of hands/feet.  Vertigo- pt reports severe episode earlier this month.  Now continues to have episodic sxs.  Taking Meclizine and OTC antihistamine w/ temporary and mild relief.  Doing modified Epley at home.   Review of Systems For ROS see HPI     Objective:   Physical Exam Vitals reviewed.  Constitutional:      General: She is not in acute distress.    Appearance: Normal appearance. She is well-developed. She is not ill-appearing.  HENT:     Head: Normocephalic and atraumatic.  Eyes:     Extraocular Movements: Extraocular movements intact.     Conjunctiva/sclera: Conjunctivae normal.     Pupils: Pupils are equal, round, and reactive to light.  Neck:     Thyroid: No thyromegaly.  Cardiovascular:     Rate and Rhythm: Normal rate and regular rhythm.     Pulses: Normal pulses.     Heart sounds: Normal heart sounds. No murmur heard. Pulmonary:     Effort: Pulmonary effort is normal. No respiratory distress.     Breath sounds: Normal breath sounds.  Abdominal:     General: There is no distension.     Palpations: Abdomen is soft.     Tenderness: There is no abdominal tenderness.  Musculoskeletal:     Cervical back: Normal range of motion and neck supple.     Right lower leg: No edema.     Left lower leg: No edema.  Lymphadenopathy:     Cervical: No cervical adenopathy.  Skin:    General: Skin is warm and dry.  Neurological:     General: No focal deficit present.     Mental Status: She is alert and oriented to person, place, and time.  Psychiatric:        Mood and Affect: Mood normal.        Behavior: Behavior normal.           Assessment & Plan:

## 2022-11-30 NOTE — Assessment & Plan Note (Signed)
New.  Pt reports severe episode earlier this month.  Since then, she has had episodic sxs.  She takes Meclizine and antihistamines w/o relief.  No sustained improvement w/ modified Epley.  Will refer to neuro/vestibular rehab.  Pt expressed understanding and is in agreement w/ plan.

## 2022-11-30 NOTE — Assessment & Plan Note (Signed)
Chronic problem.  Currently on Onglyza '5mg'$  daily and Metformin XR '1000mg'$  daily.  UTD on eye exam, foot exam.  Will get microalbumin today.  Currently asymptomatic.  Check labs.  Adjust meds prn

## 2022-12-01 ENCOUNTER — Encounter: Payer: Self-pay | Admitting: Physical Therapy

## 2022-12-01 ENCOUNTER — Telehealth: Payer: Self-pay

## 2022-12-01 ENCOUNTER — Other Ambulatory Visit: Payer: Self-pay

## 2022-12-01 ENCOUNTER — Ambulatory Visit: Payer: Medicare HMO | Attending: Family Medicine | Admitting: Physical Therapy

## 2022-12-01 DIAGNOSIS — R42 Dizziness and giddiness: Secondary | ICD-10-CM | POA: Insufficient documentation

## 2022-12-01 MED ORDER — EMPAGLIFLOZIN 10 MG PO TABS: 10.0000 mg | ORAL_TABLET | Freq: Every day | ORAL | 3 refills | Status: DC

## 2022-12-01 NOTE — Telephone Encounter (Signed)
-----  Message from Midge Minium, MD sent at 12/01/2022  7:29 AM EST ----- Your A1C has jumped from 7.8 --> 8.6%  This indicates poor sugar control.  We will continue the Metformin and Onglyza but will also add Jardiance '10mg'$  daily (#30, 3 refills) while working on low carb/low sugar diet

## 2022-12-01 NOTE — Telephone Encounter (Signed)
I informed pt of lab results. I have sent in the Jardiance 10 mg . Pt states she thinks her A1C is elevated due to taking prednisolone she just wanted you to know

## 2022-12-01 NOTE — Therapy (Signed)
OUTPATIENT PHYSICAL THERAPY VESTIBULAR EVALUATION     Patient Name: Ashley Shaffer MRN: [id number] DOB:1951-01-06, 72 y.o., female Today's Date: 12/01/2022  END OF SESSION:  PT End of Session - 12/01/22 0846     Visit Number 1    Number of Visits 10    Date for PT Re-Evaluation 12/30/22    Authorization Type [employer]    PT Start Time [phone]    PT Stop Time 0930    PT Time Calculation (min) 43 min    Activity Tolerance Patient tolerated treatment well    Behavior During Therapy WFL for tasks assessed/performed             Past Medical History:  Diagnosis Date   Arthritis    Basal cell carcinoma    Depression    Diabetes mellitus without complication ([place])    type II   Hyperlipidemia    Ischemic colitis ([place])    Left ACL tear    Osteopenia    Thyroid disease    thyroid removed age 21 due to benign mass   Vertigo    Past Surgical History:  Procedure Laterality Date   BREAST BIOPSY Left    Benign   BREAST MASS EXCISION Right 1998   [place] ARTHROSCOPY  12/04/2007   REPLACEMENT TOTAL KNEE Left 02/21/2022   REPLACEMENT TOTAL KNEE Right 07/2022   SHOULDER SURGERY Right 08/10/2006   THYROIDECTOMY  1978   due to enlarged goiter   TONSILLECTOMY     TOTAL KNEE ARTHROPLASTY Left 02/21/2022   Procedure: LEFT TOTAL KNEE ARTHROPLASTY;  Surgeon: Willaim Sheng, MD;  Location: [facility];  Service: Orthopedics;  Laterality: Left;   TUBAL LIGATION     Patient Active Problem List   Diagnosis Date Noted   Vertigo 11/30/2022   Hyperlipidemia 04/03/2017   Retinal scar of left eye 06/29/2016   Physical exam 04/01/2016   Diabetes mellitus type II, controlled, with no complications ([place]) 67/59/1638   Hypothyroid 01/27/2016   Depression 01/27/2016   Idiopathic scoliosis 01/27/2016    PCP: Midge Minium, MD  REFERRING PROVIDER: Midge Minium, MD   REFERRING DIAG: R42 (ICD-10-CM) - Vertigo   THERAPY DIAG:  Dizziness and  giddiness  ONSET DATE: 11/30/2022 MD referral  Rationale for Evaluation and Treatment: Rehabilitation  SUBJECTIVE:   SUBJECTIVE STATEMENT: This has happened before.  Started early January, lasted 8 days.  Now just episodes of it. Pt accompanied by: self  PERTINENT HISTORY: See PMH above  PAIN:  Are you having pain? No  PRECAUTIONS: None  WEIGHT BEARING RESTRICTIONS: No  FALLS: Has patient fallen in last 6 months? Yes. Number of falls 1 from a horse *Fall was Friday before Christmas and wearing helmet and head snapped back and hit ground back of head.*  LIVING ENVIRONMENT: Lives with: lives with their spouse Lives in: House/apartment Stairs: Yes: Internal: 12 steps; on right going up and but doesn't need to use Has following equipment at home: None  PLOF: Independent and Vocation/Vocational requirements: enjoys riding horses  PATIENT GOALS: To get back to riding horse  OBJECTIVE:   DIAGNOSTIC FINDINGS: NA  COGNITION: Overall cognitive status: Within functional limits for tasks assessed   POSTURE:  No Significant postural limitations  Cervical ROM:  A/ROM WFL   BED MOBILITY:  Independent  TRANSFERS: Assistive device utilized: None  Sit to stand: Complete Independence Stand to sit: Complete Independence  GAIT: Gait pattern:  slightly guarded after positional testing and  WFL Distance walked: clinic distances Assistive device utilized: None Level of assistance: Complete Independence  PATIENT SURVEYS:  FOTO Intake score 62; predicted score 65  VESTIBULAR ASSESSMENT:  GENERAL OBSERVATION: No acute distress; pt does tend to look up in sitting position   SYMPTOM BEHAVIOR:  Subjective history: Reports falling off horse just before Christmas and neck went back/hit head on ground wearing helmet).  December 14th got new bifocals and wear all the time.  Has had vertigo before; vertigo started again early January and lasted 8 days (longest bout she's had).  She's  done Epley before and has been doing.  Went to MD yesterday and got started on Meclizine.  Bending down to load dishwasher sometimes brings it on, it goes away within seconds  Non-Vestibular symptoms:  NA  Type of dizziness: Spinning/Vertigo and Unsteady with head/body turns  Frequency: 8 days at beginning of January, periodic episodes  Duration: seconds, to < minute  Aggravating factors: Induced by position change: rolling to the right and rolling to the left and Induced by motion: turning head quickly  Relieving factors: head stationary, lying supine, and Meclizine started 11/30/2022; doing Epley at home  Progression of symptoms: better  OCULOMOTOR EXAM:  Ocular Alignment: normal and L eyelid droops (not new per pt report)  Ocular ROM: No Limitations  Spontaneous Nystagmus: absent  Gaze-Induced Nystagmus:  slight saccades noted L eye with horizontal motion  Smooth Pursuits:  saccadic intrusions L eye  Saccades: intact horizontal and vertical  Convergence/Divergence: 2-3 cm (blurry)   VESTIBULAR - OCULAR REFLEX:   Slow VOR: Normal  VOR Cancellation: Normal  Head-Impulse Test: HIT Right: negative HIT Left: negative    POSITIONAL TESTING: Right Dix-Hallpike: no nystagmus and no reported symptoms Left Dix-Hallpike: no nystagmus and dizziness upon return to sit; with following finger target, L eye saccadic movement Right Roll Test: no nystagmus and symptoms worse on right side Left Roll Test: no nystagmus and feels slight symptoms initially with rolling For R roll test, symptoms last <30 seconds    VESTIBULAR TREATMENT:                                                                                                   DATE: 12/01/2022  Canalith Repositioning:  BBQ Roll Right: Number of Reps: 1, Response to Treatment: symptoms improved, and Comment: reports lightheadedness after treatment, but overall reports feeling better  PATIENT EDUCATION: Education details: PT eval results, POC;  positional testing results and rationale for treatment Person educated: Patient Education method: Explanation, Demonstration, and Verbal cues Education comprehension: verbalized understanding  HOME EXERCISE PROGRAM:  GOALS: Goals reviewed with patient? Yes  SHORT TERM GOALS: = LTGs   LONG TERM GOALS: Target date: 12/30/2022  Pt will be independent with HEP for improved dizziness and vertigo symptoms for improved functional mobility. Baseline:  Goal status: INITIAL  2.  Pt will perform bed mobility and dishwashing tasks with 0/10 c/o dizziness. Baseline:  Goal status: INITIAL  3.  FOTO score to improve to at least 65 for improved functional mobility and decreased dizziness. Baseline: 62 at eval Goal status:  INITIAL   ASSESSMENT:  CLINICAL IMPRESSION: Patient is a 72 y.o. female who was seen today for physical therapy evaluation and treatment for vertigo.   She reports symptoms onset at beginning of January, with vertigo lasting 8 days, then intermittent episodes since then.  She has had vertigo in the past and has used Epley maneuver before, but vertigo has never lasted this long.  She is doing Epley at home and started Meclizine yesterday.  She demonstrates saccadic intrusions L eye with smooth pursuits.  She reports dizziness with return to sit from L Dix-Hallpike and she reports significant dizziness with R roll test for R horizontal canal. Nystagmus does not appear to be present in any positions, but due to pt experiencing symptoms in R roll position, treatment provided for R horizontal canal canalisthiasis.  Pt reports symptom improvement upon completion; will continue to reassess in further sessions and treat as appropriate.  OBJECTIVE IMPAIRMENTS: dizziness.   ACTIVITY LIMITATIONS: bending and bed mobility  PARTICIPATION LIMITATIONS: meal prep, community activity, and horseback riding  PERSONAL FACTORS: 3+ comorbidities: see above PMH  are also affecting patient's  functional outcome.   REHAB POTENTIAL: Good  CLINICAL DECISION MAKING: Stable/uncomplicated  EVALUATION COMPLEXITY: Low   PLAN:  PT FREQUENCY: 2x/week  PT DURATION: other: 5 weeks, including eval week  PLANNED INTERVENTIONS: Therapeutic exercises, Therapeutic activity, Neuromuscular re-education, Balance training, Patient/Family education, Self Care, Vestibular training, and Canalith repositioning  PLAN FOR NEXT SESSION: Reassess BPPV and treat as appropriate.   Frazier Butt., PT 12/01/2022, 11:15 AM   [facility] [place], [place], [place] [address] Phone # [phone] Fax # [phone]

## 2022-12-02 ENCOUNTER — Ambulatory Visit: Payer: Medicare HMO | Admitting: Physical Therapy

## 2022-12-02 ENCOUNTER — Encounter: Payer: Self-pay | Admitting: Physical Therapy

## 2022-12-02 DIAGNOSIS — R42 Dizziness and giddiness: Secondary | ICD-10-CM

## 2022-12-02 NOTE — Therapy (Signed)
OUTPATIENT PHYSICAL THERAPY VESTIBULAR EVALUATION     Patient Name: Ashley Shaffer MRN: [id number] DOB:1951-03-29, 72 y.o., female Today's Date: 12/02/2022  END OF SESSION:  PT End of Session - 12/02/22 1022     Visit Number 2    Number of Visits 10    Date for PT Re-Evaluation 12/30/22    Authorization Type [employer]    PT Start Time 1022    PT Stop Time 1100    PT Time Calculation (min) 38 min    Activity Tolerance Patient tolerated treatment well    Behavior During Therapy WFL for tasks assessed/performed              Past Medical History:  Diagnosis Date   Arthritis    Basal cell carcinoma    Depression    Diabetes mellitus without complication ([place])    type II   Hyperlipidemia    Ischemic colitis ([place])    Left ACL tear    Osteopenia    Thyroid disease    thyroid removed age 46 due to benign mass   Vertigo    Past Surgical History:  Procedure Laterality Date   BREAST BIOPSY Left    Benign   BREAST MASS EXCISION Right 1998   [place] ARTHROSCOPY  12/04/2007   REPLACEMENT TOTAL KNEE Left 02/21/2022   REPLACEMENT TOTAL KNEE Right 07/2022   SHOULDER SURGERY Right 08/10/2006   THYROIDECTOMY  1978   due to enlarged goiter   TONSILLECTOMY     TOTAL KNEE ARTHROPLASTY Left 02/21/2022   Procedure: LEFT TOTAL KNEE ARTHROPLASTY;  Surgeon: Willaim Sheng, MD;  Location: [facility];  Service: Orthopedics;  Laterality: Left;   TUBAL LIGATION     Patient Active Problem List   Diagnosis Date Noted   Vertigo 11/30/2022   Hyperlipidemia 04/03/2017   Retinal scar of left eye 06/29/2016   Physical exam 04/01/2016   Diabetes mellitus type II, controlled, with no complications ([place]) 93/79/0240   Hypothyroid 01/27/2016   Depression 01/27/2016   Idiopathic scoliosis 01/27/2016    PCP: Midge Minium, MD  REFERRING PROVIDER: Midge Minium, MD   REFERRING DIAG: R42 (ICD-10-CM) - Vertigo   THERAPY DIAG:  Dizziness and  giddiness  ONSET DATE: 11/30/2022 MD referral  Rationale for Evaluation and Treatment: Rehabilitation  SUBJECTIVE:   SUBJECTIVE STATEMENT: Last Meclizine was yesterday at noon.  The vertigo episodes (3) since yesterday were very brief (<1 sec).  Sat up in bed this morning and didn't have it.  Dramatically improved. Pt accompanied by: self  PERTINENT HISTORY: See PMH above  PAIN:  Are you having pain? No  PRECAUTIONS: None  WEIGHT BEARING RESTRICTIONS: No  FALLS: Has patient fallen in last 6 months? Yes. Number of falls 1 from a horse *Fall was Friday before Christmas and wearing helmet and head snapped back and hit ground back of head.*  LIVING ENVIRONMENT: Lives with: lives with their spouse Lives in: House/apartment Stairs: Yes: Internal: 12 steps; on right going up and but doesn't need to use Has following equipment at home: None  PLOF: Independent and Vocation/Vocational requirements: enjoys riding horses  PATIENT GOALS: To get back to riding horse  OBJECTIVE:    TODAY'S TREATMENT: 12/02/2022 Activity Comments  L DH No symptoms, negative nystagmus; reports "goofy vertigo feeling" upon return to sit 15-30 seconds  R DH No symptoms, no nystagmus  L roll test No symptoms, no nystagmus  R roll test Symptoms, wavy symptoms in  R roll position, <15 seconds, then upon sitting reports vertigo feeling 15-30 seconds  R BBQ roll Symptoms improved, slight "woozy" for 1-2 seconds  Reassessed R roll test Brief symptoms  Performed BBQ roll 2nd rep No symptoms upon return to sit      Access Code: [email or web address] URL: [email or web address] Date: 12/02/2022 Prepared by: [facility]  Exercises - Supine to Right Sidelying Vestibular Habituation  - 1-2 x daily - 7 x weekly - 1 sets - 3-5 reps - 30 sec hold - Supine to Left Sidelying Vestibular Habituation  - 1-2 x daily - 7 x weekly - 1 sets - 3-5 reps - 30 sec hold  PATIENT  EDUCATION: Education details: HEP for rolling habituation Person educated: Patient Education method: Theatre stage manager Education comprehension: verbalized understanding  ----------------------------------------------------------------------------------------- Objective measures below taken at time of initial evaluation:  DIAGNOSTIC FINDINGS: NA  COGNITION: Overall cognitive status: Within functional limits for tasks assessed   POSTURE:  No Significant postural limitations  Cervical ROM:  A/ROM WFL   BED MOBILITY:  Independent  TRANSFERS: Assistive device utilized: None  Sit to stand: Complete Independence Stand to sit: Complete Independence  GAIT: Gait pattern:  slightly guarded after positional testing and WFL Distance walked: clinic distances Assistive device utilized: None Level of assistance: Complete Independence  PATIENT SURVEYS:  FOTO Intake score 62; predicted score 65  VESTIBULAR ASSESSMENT:  GENERAL OBSERVATION: No acute distress; pt does tend to look up in sitting position   SYMPTOM BEHAVIOR:  Subjective history: Reports falling off horse just before Christmas and neck went back/hit head on ground wearing helmet).  December 14th got new bifocals and wear all the time.  Has had vertigo before; vertigo started again early January and lasted 8 days (longest bout she's had).  She's done [place] before and has been doing.  Went to MD yesterday and got started on Meclizine.  Bending down to load dishwasher sometimes brings it on, it goes away within seconds  Non-Vestibular symptoms:  NA  Type of dizziness: Spinning/Vertigo and Unsteady with head/body turns  Frequency: 8 days at beginning of January, periodic episodes  Duration: seconds, to < minute  Aggravating factors: Induced by position change: rolling to the right and rolling to the left and Induced by motion: turning head quickly  Relieving factors: head stationary, lying supine, and Meclizine started  11/30/2022; doing Epley at home  Progression of symptoms: better  OCULOMOTOR EXAM:  Ocular Alignment: normal and L eyelid droops (not new per pt report)  Ocular ROM: No Limitations  Spontaneous Nystagmus: absent  Gaze-Induced Nystagmus:  slight saccades noted L eye with horizontal motion  Smooth Pursuits:  saccadic intrusions L eye  Saccades: intact horizontal and vertical  Convergence/Divergence: 2-3 cm (blurry)   VESTIBULAR - OCULAR REFLEX:   Slow VOR: Normal  VOR Cancellation: Normal  Head-Impulse Test: HIT Right: negative HIT Left: negative    POSITIONAL TESTING: Right Dix-Hallpike: no nystagmus and no reported symptoms Left Dix-Hallpike: no nystagmus and dizziness upon return to sit; with following finger target, L eye saccadic movement Right Roll Test: no nystagmus and symptoms worse on right side Left Roll Test: no nystagmus and feels slight symptoms initially with rolling For R roll test, symptoms last <30 seconds    VESTIBULAR TREATMENT:  DATE: 12/01/2022  Canalith Repositioning:  BBQ Roll Right: Number of Reps: 1, Response to Treatment: symptoms improved, and Comment: reports lightheadedness after treatment, but overall reports feeling better  PATIENT EDUCATION: Education details: PT eval results, POC; positional testing results and rationale for treatment Person educated: Patient Education method: Explanation, Demonstration, and Verbal cues Education comprehension: verbalized understanding  HOME EXERCISE PROGRAM:  GOALS: Goals reviewed with patient? Yes  SHORT TERM GOALS: = LTGs   LONG TERM GOALS: Target date: 12/30/2022  Pt will be independent with HEP for improved dizziness and vertigo symptoms for improved functional mobility. Baseline:  Goal status: IN PROGRESS  2.  Pt will perform bed mobility and dishwashing tasks with 0/10 c/o dizziness. Baseline:  Goal  status: IN PROGRESS  3.  FOTO score to improve to at least 65 for improved functional mobility and decreased dizziness. Baseline: 62 at eval Goal status: IN PROGRESS   ASSESSMENT:  CLINICAL IMPRESSION: Pt returns today after evaluation yesterday for vertigo symptoms.  Pt reports today dramatic improvement since PT session following canalith repositioning maneuver yesterday for R horizontal canal BPPV.  She reports having very minimal symptoms and feels better.  With testing today to reassess, pt does have dizziness again with return to sit from L Dix-hallpike position.  She also has dizziness/waviness with R roll test.  Nystagmus not noted, but treated R horizontal BPPV with BBQ roll x 2 this session, with pt reporting no symptoms upon return to sitting position after 2nd trial.  Since rolling provokes symptoms and symptoms are improving, educated pt in rolling for habituation, starting 12/03/2022 if needed.  Will reassess positional vertigo next visit and treat as appropriate to continue to resolve dizziness.  OBJECTIVE IMPAIRMENTS: dizziness.   ACTIVITY LIMITATIONS: bending and bed mobility  PARTICIPATION LIMITATIONS: meal prep, community activity, and horseback riding  PERSONAL FACTORS: 3+ comorbidities: see above PMH  are also affecting patient's functional outcome.   REHAB POTENTIAL: Good  CLINICAL DECISION MAKING: Stable/uncomplicated  EVALUATION COMPLEXITY: Low   PLAN:  PT FREQUENCY: 2x/week  PT DURATION: other: 5 weeks, including eval week  PLANNED INTERVENTIONS: Therapeutic exercises, Therapeutic activity, Neuromuscular re-education, Balance training, Patient/Family education, Self Care, Vestibular training, and Canalith repositioning  PLAN FOR NEXT SESSION: Reassess BPPV and treat as appropriate.   Frazier Butt., PT 12/02/2022, 11:27 AM   [facility] at [facility], [place], [place] [address] Phone # [phone] Fax # [phone]

## 2022-12-06 ENCOUNTER — Ambulatory Visit: Payer: Medicare HMO | Admitting: Physical Therapy

## 2022-12-08 DIAGNOSIS — R262 Difficulty in walking, not elsewhere classified: Secondary | ICD-10-CM | POA: Diagnosis not present

## 2022-12-08 DIAGNOSIS — M6281 Muscle weakness (generalized): Secondary | ICD-10-CM | POA: Diagnosis not present

## 2022-12-12 DIAGNOSIS — R262 Difficulty in walking, not elsewhere classified: Secondary | ICD-10-CM | POA: Diagnosis not present

## 2022-12-12 DIAGNOSIS — M6281 Muscle weakness (generalized): Secondary | ICD-10-CM | POA: Diagnosis not present

## 2022-12-20 DIAGNOSIS — M6281 Muscle weakness (generalized): Secondary | ICD-10-CM | POA: Diagnosis not present

## 2022-12-20 DIAGNOSIS — R262 Difficulty in walking, not elsewhere classified: Secondary | ICD-10-CM | POA: Diagnosis not present

## 2022-12-21 DIAGNOSIS — L308 Other specified dermatitis: Secondary | ICD-10-CM | POA: Diagnosis not present

## 2022-12-21 DIAGNOSIS — Z79899 Other long term (current) drug therapy: Secondary | ICD-10-CM | POA: Diagnosis not present

## 2022-12-27 DIAGNOSIS — R262 Difficulty in walking, not elsewhere classified: Secondary | ICD-10-CM | POA: Diagnosis not present

## 2022-12-27 DIAGNOSIS — M6281 Muscle weakness (generalized): Secondary | ICD-10-CM | POA: Diagnosis not present

## 2023-01-04 ENCOUNTER — Encounter: Payer: Self-pay | Admitting: Physical Therapy

## 2023-01-04 NOTE — Therapy (Signed)
[facility] [address] [place], [place], [address] Phone: [phone]   Fax:  [phone]  Patient Details  Name: Ashley Shaffer MRN: [id number] Date of Birth: 09/10/1951 Referring Provider:  No ref. provider found  Encounter Date: 01/04/2023  PHYSICAL THERAPY DISCHARGE SUMMARY  Visits from Start of Care: 2  Current functional level related to goals / functional outcomes: Unable to fully assess due to pt not returning for therapy; pt did cancel remaining visits due to symptoms resolved.   Remaining deficits: NA   Education / Equipment: BPPV education   Patient agrees to discharge. Patient goals were not met. Patient is being discharged due to being pleased with the current functional level.   [name] W., PT 01/04/2023, 8:48 AM  [facility] [address], [place], [place], [address] Phone: [phone]   Fax:  [phone]

## 2023-01-16 NOTE — Progress Notes (Signed)
Office Visit Note  Patient: Ashley Shaffer             Date of Birth: 04-17-1951           MRN: [id number]             PCP: Midge Minium, MD Referring: Midge Minium, MD Visit Date: 01/17/2023 Occupation: Retired Engineer, production  Subjective:  Edema and Pain of the Left Hand, Edema and Pain of the Right Hand, Edema and Pain of the Right Ankle, Edema and Pain of the Left Ankle, Edema and Pain of the Right Foot, Edema and Pain of the Left Foot, and New Patient (Initial Visit) (Patient states her hands, feet, and ankles are swollen and hurting.)   History of Present Illness: Leeba Chillemi is a 72 y.o. female here for evaluation of positive ANA checked in association with joint pains and skin rashes.  Long history of arthritis in multiple areas dating back at least about 10 years of noticeable symptoms.  Joint pain and stiffness in both hands as previously been treated with Celebrex with pretty good benefit did not improve a lot with ibuprofen.  She has not had any particular imaging of her hands.  Previously had arthroscopic repair of rotator cuff tendon and bilateral shoulders subsequently reinjured both of these.  She also had knee replacements on both sides over the course of last year and finished physical therapy within the past month.  She had 2 surgeries for right great toe bunion and revision without entirely satisfactory result so has left left foot unaddressed.  Symptoms are persistent but without very prolonged stiffness and feels her mobility and function are manageable on the Celebrex.  She stays active horse riding.  Most recently having some worsening of the bony nodules and deviation in her finger joints.  Especially with new cyst on her right small finger that has occasionally been draining clear fluid. New issue last year was development of atopic dermatitis.  This started on her torso subsequently progressing to involve her groin area.  This was  treated with hydrocortisone treatment with minimal benefit.  She established with dermatology at Dr. Juel Burrow office started on methotrexate 1 month ago at 10 mg weekly dose.  She has upcoming follow-up planned for rechecking labs and possible medication adjustment.  So far the rash on her torso has partially responded groin rash is not changed much. She has not noticed any new complaint of hair loss, photosensitivity, oral nasal ulcers, lymphadenopathy, or Raynaud's symptoms.  Labs reviewed 09/2022 ANA 1:40 speckled 1:40 cytoplasmic RF neg  Activities of Daily Living:  Patient reports morning stiffness for 5 minutes.   Patient Denies nocturnal pain.  Difficulty dressing/grooming: Denies Difficulty climbing stairs: Denies Difficulty getting out of chair: Denies Difficulty using hands for taps, buttons, cutlery, and/or writing: Denies  Review of Systems  Constitutional:  Negative for fatigue.  HENT:  Negative for mouth sores and mouth dryness.   Eyes:  Negative for dryness.  Respiratory:  Positive for shortness of breath.   Cardiovascular:  Negative for chest pain and palpitations.  Gastrointestinal:  Negative for blood in stool, constipation and diarrhea.  Endocrine: Positive for increased urination.  Genitourinary:  Negative for involuntary urination.  Musculoskeletal:  Positive for joint pain, gait problem, joint pain, joint swelling, myalgias, muscle weakness, morning stiffness, muscle tenderness and myalgias.  Skin:  Positive for rash and sensitivity to sunlight. Negative for color change and hair loss.  Allergic/Immunologic: Negative for susceptible to  infections.  Neurological:  Positive for dizziness. Negative for headaches.  Hematological:  Negative for swollen glands.  Psychiatric/Behavioral:  Negative for depressed mood and sleep disturbance. The patient is not nervous/anxious.     PMFS History:  Patient Active Problem List   Diagnosis Date Noted   Generalized  osteoarthritis 01/17/2023   Positive ANA (antinuclear antibody) 01/17/2023   Digital mucous cyst of finger 01/17/2023   Vertigo 11/30/2022   Hyperlipidemia 04/03/2017   Retinal scar of left eye 06/29/2016   Physical exam 04/01/2016   Diabetes mellitus type II, controlled, with no complications 01/27/2016   Hypothyroid 01/27/2016   Depression 01/27/2016   Idiopathic scoliosis 01/27/2016    Past Medical History:  Diagnosis Date   Arthritis    Basal cell carcinoma    Depression    Diabetes mellitus without complication ([facility])    type II   Eczema    Hyperlipidemia    Ischemic colitis ([facility])    Left ACL tear    Osteopenia    Thyroid disease    thyroid removed age 43 due to benign mass   Vertigo     Family History  Problem Relation Age of Onset   Heart disease Mother    Healthy Sister    Heart disease Brother    Diabetes Maternal Aunt    Past Surgical History:  Procedure Laterality Date   BREAST BIOPSY Left    Benign   BREAST MASS EXCISION Right 1998   BUNIONECTOMY  1997   [place] ARTHROSCOPY  12/04/2007   REPLACEMENT TOTAL KNEE Left 02/21/2022   REPLACEMENT TOTAL KNEE Right 07/2022   SHOULDER SURGERY Right 08/10/2006   THYROIDECTOMY  1978   due to enlarged goiter   TONSILLECTOMY     TOTAL KNEE ARTHROPLASTY Left 02/21/2022   Procedure: LEFT TOTAL KNEE ARTHROPLASTY;  Surgeon: Joen Laura, MD;  Location: [facility];  Service: Orthopedics;  Laterality: Left;   TUBAL LIGATION     Social History   Social History Narrative   Not on file   Immunization History  Administered Date(s) Administered   Fluad Quad(high Dose 65+) 07/16/2019, 07/13/2020, 07/15/2021   Influenza, High Dose Seasonal PF 07/12/2018   Influenza,inj,Quad PF,6+ Mos 08/01/2008, 08/10/2010, 08/31/2012, 08/05/2013, 10/07/2014, 09/16/2015, 10/11/2016, 07/18/2017   Influenza-Unspecified 08/14/2021, 07/29/2022, 07/29/2022   PFIZER Comirnaty(Gray Top)Covid-19 Tri-Sucrose Vaccine 02/23/2021, 02/23/2021    PFIZER(Purple Top)SARS-COV-2 Vaccination 11/21/2019, 12/12/2019, 07/29/2022   PNEUMOCOCCAL CONJUGATE-20 08/05/2022   Pfizer Covid-19 Vaccine Bivalent Booster 62yrs & up 08/03/2021, 07/29/2022   Pneumococcal Conjugate-13 04/01/2016   Pneumococcal Polysaccharide-23 08/31/2012, 04/03/2017   Respiratory Syncytial Virus Vaccine,Recomb Aduvanted(Arexvy) 08/04/2022   Tdap 10/15/2007   Zoster Recombinat (Shingrix) 08/27/2021, 01/10/2022   Zoster, Live 10/13/2011     Objective: Vital Signs: BP 127/75 (BP Location: Right Arm, Patient Position: Sitting, Cuff Size: Normal)   Pulse 69   Resp 14   Ht 5\' 4"  (1.626 m)   Wt 137 lb (62.1 kg)   LMP 10/31/2004 (Approximate)   BMI 23.52 kg/m    Physical Exam HENT:     Mouth/Throat:     Mouth: Mucous membranes are moist.     Pharynx: Oropharynx is clear.  Eyes:     Conjunctiva/sclera: Conjunctivae normal.  Cardiovascular:     Rate and Rhythm: Normal rate and regular rhythm.  Pulmonary:     Effort: Pulmonary effort is normal.     Breath sounds: Normal breath sounds.  Musculoskeletal:     Right lower leg: No edema.     Left  lower leg: No edema.  Skin:    Findings: Rash present.  Neurological:     Mental Status: She is alert.  Psychiatric:        Mood and Affect: Mood normal.      Musculoskeletal Exam:  Shoulders full ROM no tenderness or swelling Elbows full ROM no tenderness or swelling Wrists full ROM no tenderness or swelling Fingers bony nodules throughout finger joints of both hands proximal and distal, worst at 2nd MCP and DIP, left 5th DIP mucous cyst with slight erythema Hip normal internal and external rotation without pain, no tenderness to lateral hip palpation Knees full ROM no tenderness or swelling Ankles full ROM no tenderness or swelling MTPs full ROM left foot bunion with lateral deviation, slightly cocked up toe deformities but reducible   Investigation: No additional findings.  Imaging: No results  found.  Recent Labs: Lab Results  Component Value Date   WBC 8.0 08/29/2022   HGB 13.1 08/29/2022   PLT 333.0 08/29/2022   NA 133 (L) 11/30/2022   K 4.3 11/30/2022   CL 97 11/30/2022   CO2 29 11/30/2022   GLUCOSE 179 (H) 11/30/2022   BUN 16 11/30/2022   CREATININE 0.66 11/30/2022   BILITOT 0.3 08/29/2022   ALKPHOS 89 08/29/2022   AST 16 08/29/2022   ALT 15 08/29/2022   PROT 7.1 08/29/2022   ALBUMIN 4.5 08/29/2022   CALCIUM 9.8 11/30/2022   GFRAA >60 11/14/2019    Speciality Comments: No specialty comments available.  Procedures:  No procedures performed Allergies: Patient has no known allergies.   Assessment / Plan:     Visit Diagnoses: Generalized osteoarthritis - Plan: XR Hand 2 View Right, XR Hand 2 View Left, XR Foot 2 Views Right, XR Foot 2 Views Left, celecoxib (CELEBREX) 100 MG capsule  Has widespread degenerative arthritis changes in multiple areas not sure if there is secondary inflammatory process or if this could account for the complaints on his own.  X-ray of bilateral hands and feet show osteoarthritis most severe at the first [facility] joint both hands also with distal finger joint involvement and midfoot joints.  Few finger joints could be consistent with erosive osteoarthritis process but not certain.  Agree with continued Celebrex 100 mg for symptom management of osteoarthritis new Rx sent today.  Positive ANA (antinuclear antibody) - Plan: Sedimentation rate, C-reactive protein, RNP Antibody, Anti-Smith antibody, Sjogrens syndrome-A extractable nuclear antibody, Sjogrens syndrome-B extractable nuclear antibody, Anti-DNA antibody, double-stranded, C3 and C4  Positive ANA along with the increased joint pains and a few areas like the cyst with extra swelling.  Checking lab workup today for any evidence of active inflammation including sed rate CRP antibody panel and serum complements.  Interestingly she was started on methotrexate by dermatology for skin rashes.  I  discussed this would often be a first-line choice for inflammatory arthritis as well and will be curious if he notices additional benefit in joint symptoms.  Can plan to follow-up in around 2 months would be adequate treatment duration to see response in joint symptoms.  Digital mucous cyst of finger  Left fifth DIP there is some associated erythema but not particularly painful or limiting her use and activity.  Orders: Orders Placed This Encounter  Procedures   XR Hand 2 View Right   XR Hand 2 View Left   XR Foot 2 Views Right   XR Foot 2 Views Left   Sedimentation rate   C-reactive protein   RNP Antibody  Anti-Smith antibody   Sjogrens syndrome-A extractable nuclear antibody   Sjogrens syndrome-B extractable nuclear antibody   Anti-DNA antibody, double-stranded   C3 and C4   Meds ordered this encounter  Medications   celecoxib (CELEBREX) 100 MG capsule    Sig: Take 1 capsule (100 mg total) by mouth 2 (two) times daily.    Dispense:  60 capsule    Refill:  1     Follow-Up Instructions: Return in about 6 weeks (around 02/28/2023) for New pt f/u 6-8 wks.   Fuller Plan, MD  Note - This record has been created using [employer].  Chart creation errors have been sought, but may not always  have been located. Such creation errors do not reflect on  the standard of medical care.

## 2023-01-17 ENCOUNTER — Ambulatory Visit: Payer: Medicare HMO

## 2023-01-17 ENCOUNTER — Ambulatory Visit (INDEPENDENT_AMBULATORY_CARE_PROVIDER_SITE_OTHER): Payer: Medicare HMO

## 2023-01-17 ENCOUNTER — Ambulatory Visit: Payer: Medicare HMO | Attending: Internal Medicine | Admitting: Internal Medicine

## 2023-01-17 ENCOUNTER — Encounter: Payer: Self-pay | Admitting: Internal Medicine

## 2023-01-17 VITALS — BP 127/75 | HR 69 | Resp 14 | Ht 64.0 in | Wt 137.0 lb

## 2023-01-17 DIAGNOSIS — M159 Polyosteoarthritis, unspecified: Secondary | ICD-10-CM

## 2023-01-17 DIAGNOSIS — M79641 Pain in right hand: Secondary | ICD-10-CM

## 2023-01-17 DIAGNOSIS — M79642 Pain in left hand: Secondary | ICD-10-CM

## 2023-01-17 DIAGNOSIS — M79672 Pain in left foot: Secondary | ICD-10-CM | POA: Diagnosis not present

## 2023-01-17 DIAGNOSIS — R768 Other specified abnormal immunological findings in serum: Secondary | ICD-10-CM

## 2023-01-17 DIAGNOSIS — M79671 Pain in right foot: Secondary | ICD-10-CM

## 2023-01-17 DIAGNOSIS — M67449 Ganglion, unspecified hand: Secondary | ICD-10-CM | POA: Insufficient documentation

## 2023-01-17 MED ORDER — CELECOXIB 100 MG PO CAPS: 100.0000 mg | ORAL_CAPSULE | Freq: Two times a day (BID) | ORAL | 1 refills | Status: DC

## 2023-01-18 DIAGNOSIS — Z79899 Other long term (current) drug therapy: Secondary | ICD-10-CM | POA: Diagnosis not present

## 2023-01-18 DIAGNOSIS — L308 Other specified dermatitis: Secondary | ICD-10-CM | POA: Diagnosis not present

## 2023-01-18 LAB — C3 AND C4
C3 Complement: 112 mg/dL (ref 83–193)
C4 Complement: 24 mg/dL (ref 15–57)

## 2023-01-18 LAB — SJOGRENS SYNDROME-A EXTRACTABLE NUCLEAR ANTIBODY: SSA (Ro) (ENA) Antibody, IgG: <1 AI

## 2023-01-18 LAB — SEDIMENTATION RATE: Sed Rate: 6 mm/h (ref 0–30)

## 2023-01-18 LAB — RNP ANTIBODY: Ribonucleic Protein(ENA) Antibody, IgG: <1 AI

## 2023-01-18 LAB — C-REACTIVE PROTEIN: CRP: 0.6 mg/L (ref <8.0)

## 2023-01-18 LAB — ANTI-SMITH ANTIBODY: ENA SM Ab Ser-aCnc: <1 AI

## 2023-01-18 LAB — ANTI-DNA ANTIBODY, DOUBLE-STRANDED: ds DNA Ab: <1 IU/mL

## 2023-01-18 LAB — SJOGRENS SYNDROME-B EXTRACTABLE NUCLEAR ANTIBODY: SSB (La) (ENA) Antibody, IgG: <1 AI

## 2023-01-19 NOTE — Progress Notes (Signed)
Lab results all came back normal with a normal sedimentation rate CRP and serum complements do not indicate active systemic inflammation.  The more specific antibody tests for the positive ANA were also negative.  X-ray showed osteoarthritis in multiple areas and that hands and feet.  The hand x-ray was not definitive but could show signs of the more erosive osteoarthritis that we mentioned at our clinic visit.  I do not recommend any specific new medication changes I like to see if she notices any additional improvement with starting the methotrexate for her skin and can continue the Celebrex.

## 2023-01-31 ENCOUNTER — Other Ambulatory Visit: Payer: Self-pay

## 2023-01-31 ENCOUNTER — Encounter: Payer: Self-pay | Admitting: Family Medicine

## 2023-01-31 DIAGNOSIS — F339 Major depressive disorder, recurrent, unspecified: Secondary | ICD-10-CM

## 2023-01-31 MED ORDER — BUPROPION HCL ER (SR) 100 MG PO TB12: ORAL_TABLET | ORAL | 1 refills | Status: DC

## 2023-02-14 ENCOUNTER — Encounter: Payer: Self-pay | Admitting: Family Medicine

## 2023-02-14 DIAGNOSIS — M545 Low back pain, unspecified: Secondary | ICD-10-CM | POA: Diagnosis not present

## 2023-02-14 DIAGNOSIS — M25552 Pain in left hip: Secondary | ICD-10-CM | POA: Diagnosis not present

## 2023-02-14 DIAGNOSIS — M25551 Pain in right hip: Secondary | ICD-10-CM | POA: Diagnosis not present

## 2023-02-15 NOTE — Telephone Encounter (Signed)
Pt seeking your opinion on when you think she should have next colonoscopy.  Please advise

## 2023-02-18 ENCOUNTER — Other Ambulatory Visit: Payer: Self-pay | Admitting: Family Medicine

## 2023-02-22 DIAGNOSIS — M62830 Muscle spasm of back: Secondary | ICD-10-CM | POA: Diagnosis not present

## 2023-02-22 DIAGNOSIS — M25551 Pain in right hip: Secondary | ICD-10-CM | POA: Diagnosis not present

## 2023-03-01 ENCOUNTER — Ambulatory Visit: Payer: Medicare HMO | Admitting: Internal Medicine

## 2023-03-02 NOTE — Progress Notes (Signed)
Office Visit Note  Patient: Ashley Shaffer             Date of Birth: Dec 02, 1950           MRN: [id number]             PCP: Sheliah Hatch, MD Referring: Sheliah Hatch, MD Visit Date: 03/03/2023   Subjective:  Follow-up   History of Present Illness: Ashley Shaffer is a 72 y.o. female here for follow up for joint pain in multiple areas initial visit workup showing  negative for more specific antibody markers and no increase in systemic inflammatory markers. Xrays with degenerative arthritis of multiple areas and possible EOH distal joint changes. Symptoms ongoing about the same as seen in March still without much visible joint swelling.  Previous HPI 01/17/23 Ashley Shaffer is a 72 y.o. female here for evaluation of positive ANA checked in association with joint pains and skin rashes.  Long history of arthritis in multiple areas dating back at least about 10 years of noticeable symptoms.  Joint pain and stiffness in both hands as previously been treated with Celebrex with pretty good benefit did not improve a lot with ibuprofen.  She has not had any particular imaging of her hands.  Previously had arthroscopic repair of rotator cuff tendon and bilateral shoulders subsequently reinjured both of these.  She also had knee replacements on both sides over the course of last year and finished physical therapy within the past month.  She had 2 surgeries for right great toe bunion and revision without entirely satisfactory result so has left left foot unaddressed.  Symptoms are persistent but without very prolonged stiffness and feels her mobility and function are manageable on the Celebrex.  She stays active horse riding.  Most recently having some worsening of the bony nodules and deviation in her finger joints.  Especially with new cyst on her right small finger that has occasionally been draining clear fluid. New issue last year was development of atopic dermatitis.  This  started on her torso subsequently progressing to involve her groin area.  This was treated with hydrocortisone treatment with minimal benefit.  She established with dermatology at Dr. Scharlene Gloss office started on methotrexate 1 month ago at 10 mg weekly dose.  She has upcoming follow-up planned for rechecking labs and possible medication adjustment.  So far the rash on her torso has partially responded groin rash is not changed much. She has not noticed any new complaint of hair loss, photosensitivity, oral nasal ulcers, lymphadenopathy, or Raynaud's symptoms.   Labs reviewed 09/2022 ANA 1:40 speckled 1:40 cytoplasmic RF neg   Review of Systems  Constitutional:  Negative for fatigue.  HENT:  Negative for mouth sores and mouth dryness.   Eyes:  Negative for dryness.  Respiratory:  Negative for shortness of breath.   Cardiovascular:  Negative for chest pain and palpitations.  Gastrointestinal:  Negative for blood in stool, constipation and diarrhea.  Endocrine: Negative for increased urination.  Genitourinary:  Negative for involuntary urination.  Musculoskeletal:  Positive for joint pain, joint pain, joint swelling, myalgias, morning stiffness, muscle tenderness and myalgias. Negative for gait problem and muscle weakness.  Skin:  Positive for rash. Negative for color change, hair loss and sensitivity to sunlight.  Allergic/Immunologic: Negative for susceptible to infections.  Neurological:  Negative for dizziness and headaches.  Hematological:  Negative for swollen glands.  Psychiatric/Behavioral:  Negative for depressed mood and sleep disturbance. The patient is not nervous/anxious.  PMFS History:  Patient Active Problem List   Diagnosis Date Noted   Generalized osteoarthritis 01/17/2023   Positive ANA (antinuclear antibody) 01/17/2023   Digital mucous cyst of finger 01/17/2023   Vertigo 11/30/2022   Hyperlipidemia 04/03/2017   Retinal scar of left eye 06/29/2016   Physical exam  04/01/2016   Diabetes mellitus type II, controlled, with no complications (HCC) 01/27/2016   Hypothyroid 01/27/2016   Depression 01/27/2016   Idiopathic scoliosis 01/27/2016    Past Medical History:  Diagnosis Date   Arthritis    Basal cell carcinoma    Depression    Diabetes mellitus without complication (HCC)    type II   Eczema    Hyperlipidemia    Ischemic colitis (HCC)    Left ACL tear    Osteopenia    Thyroid disease    thyroid removed age 39 due to benign mass   Vertigo     Family History  Problem Relation Age of Onset   Heart disease Mother    Healthy Sister    Heart disease Brother    Diabetes Maternal Aunt    Past Surgical History:  Procedure Laterality Date   BREAST BIOPSY Left    Benign   BREAST MASS EXCISION Right 1998   BUNIONECTOMY  1997   KNEE ARTHROSCOPY  12/04/2007   REPLACEMENT TOTAL KNEE Left 02/21/2022   REPLACEMENT TOTAL KNEE Right 07/2022   SHOULDER SURGERY Right 08/10/2006   THYROIDECTOMY  1978   due to enlarged goiter   TONSILLECTOMY     TOTAL KNEE ARTHROPLASTY Left 02/21/2022   Procedure: LEFT TOTAL KNEE ARTHROPLASTY;  Surgeon: Joen Laura, MD;  Location: [facility];  Service: Orthopedics;  Laterality: Left;   TUBAL LIGATION     Social History   Social History Narrative   Not on file   Immunization History  Administered Date(s) Administered   Fluad Quad(high Dose 65+) 07/16/2019, 07/13/2020, 07/15/2021   Influenza, High Dose Seasonal PF 07/12/2018   Influenza,inj,Quad PF,6+ Mos 08/01/2008, 08/10/2010, 08/31/2012, 08/05/2013, 10/07/2014, 09/16/2015, 10/11/2016, 07/18/2017   Influenza-Unspecified 08/14/2021, 07/29/2022, 07/29/2022   PFIZER Comirnaty(Gray Top)Covid-19 Tri-Sucrose Vaccine 02/23/2021, 02/23/2021   PFIZER(Purple Top)SARS-COV-2 Vaccination 11/21/2019, 12/12/2019, 07/29/2022   PNEUMOCOCCAL CONJUGATE-20 08/05/2022   Pfizer Covid-19 Vaccine Bivalent Booster 27yrs & up 08/03/2021, 07/29/2022   Pneumococcal Conjugate-13  04/01/2016   Pneumococcal Polysaccharide-23 08/31/2012, 04/03/2017   Respiratory Syncytial Virus Vaccine,Recomb Aduvanted(Arexvy) 08/04/2022   Tdap 10/15/2007   Zoster Recombinat (Shingrix) 08/27/2021, 01/10/2022   Zoster, Live 10/13/2011     Objective: Vital Signs: BP 97/62 (BP Location: Left Arm, Patient Position: Sitting, Cuff Size: Normal)   Pulse 77   Resp 14   Ht 5\' 4"  (1.626 m)   Wt 133 lb (60.3 kg)   LMP 10/31/2004 (Approximate)   BMI 22.83 kg/m    Physical Exam Cardiovascular:     Rate and Rhythm: Normal rate and regular rhythm.  Pulmonary:     Effort: Pulmonary effort is normal.     Breath sounds: Normal breath sounds.  Skin:    General: Skin is warm and dry.     Findings: Rash present.  Neurological:     Mental Status: She is alert.  Psychiatric:        Mood and Affect: Mood normal.      Musculoskeletal Exam:  Neck full ROM no tenderness Shoulders full ROM no tenderness or swelling Elbows full ROM no tenderness or swelling Wrists full ROM no tenderness or swelling Fingers full ROM, heberdon's nodes on both  hands, 5th DIP mucinous cyst with mild erythema Knees full ROM no tenderness or swelling   Investigation: No additional findings.  Imaging: No results found.  Recent Labs: Lab Results  Component Value Date   WBC 5.7 03/16/2023   HGB 14.3 03/16/2023   PLT 358.0 03/16/2023   NA 136 03/16/2023   K 3.8 03/16/2023   CL 99 03/16/2023   CO2 29 03/16/2023   GLUCOSE 189 (H) 03/16/2023   BUN 17 03/16/2023   CREATININE 0.72 03/16/2023   BILITOT 0.6 03/16/2023   ALKPHOS 70 03/16/2023   AST 18 03/16/2023   ALT 15 03/16/2023   PROT 7.0 03/16/2023   ALBUMIN 4.3 03/16/2023   CALCIUM 9.4 03/16/2023   GFRAA >60 11/14/2019    Speciality Comments: No specialty comments available.  Procedures:  No procedures performed Allergies: Patient has no known allergies.   Assessment / Plan:     Visit Diagnoses: Generalized osteoarthritis - Plan: celecoxib  (CELEBREX) 100 MG capsule  Overall symptoms appear most consistent with generalized primary osteoarthritis.  Discussed symptomatic treatment options including oral medication supplements and local therapies.  Will plan to continue her taking Celebrex 100 mg twice daily seems to be tolerating this low-dose without aggravating GI side effects.  Positive ANA (antinuclear antibody)  Does not appear to indicate current autoimmune connective tissue disease that may have low-grade inflammation related with osteoarthritis already on low-dose methotrexate treatment through dermatology clinic as planned laboratory follow-up for this in June.  Orders: No orders of the defined types were placed in this encounter.  Meds ordered this encounter  Medications   celecoxib (CELEBREX) 100 MG capsule    Sig: Take 1 capsule (100 mg total) by mouth 2 (two) times daily.    Dispense:  180 capsule    Refill:  1     Follow-Up Instructions: Return in about 6 months (around 09/03/2023) for OA/?PsA on NSAID f/u 6mos.   Fuller Plan, MD  Note - This record has been created using [employer].  Chart creation errors have been sought, but may not always  have been located. Such creation errors do not reflect on  the standard of medical care.

## 2023-03-03 ENCOUNTER — Ambulatory Visit: Payer: Medicare HMO | Attending: Internal Medicine | Admitting: Internal Medicine

## 2023-03-03 ENCOUNTER — Encounter: Payer: Self-pay | Admitting: Internal Medicine

## 2023-03-03 VITALS — BP 97/62 | HR 77 | Resp 14 | Ht 64.0 in | Wt 133.0 lb

## 2023-03-03 DIAGNOSIS — R768 Other specified abnormal immunological findings in serum: Secondary | ICD-10-CM

## 2023-03-03 DIAGNOSIS — M159 Polyosteoarthritis, unspecified: Secondary | ICD-10-CM | POA: Diagnosis not present

## 2023-03-03 MED ORDER — CELECOXIB 100 MG PO CAPS
100.0000 mg | ORAL_CAPSULE | Freq: Two times a day (BID) | ORAL | 1 refills | Status: DC
Start: 2023-03-03 — End: 2023-08-16

## 2023-03-03 NOTE — Patient Instructions (Signed)

## 2023-03-07 ENCOUNTER — Other Ambulatory Visit: Payer: Self-pay | Admitting: Family Medicine

## 2023-03-08 DIAGNOSIS — M47816 Spondylosis without myelopathy or radiculopathy, lumbar region: Secondary | ICD-10-CM | POA: Diagnosis not present

## 2023-03-10 DIAGNOSIS — L308 Other specified dermatitis: Secondary | ICD-10-CM | POA: Diagnosis not present

## 2023-03-10 DIAGNOSIS — Z79899 Other long term (current) drug therapy: Secondary | ICD-10-CM | POA: Diagnosis not present

## 2023-03-13 ENCOUNTER — Ambulatory Visit: Payer: Medicare HMO | Admitting: Family Medicine

## 2023-03-14 ENCOUNTER — Ambulatory Visit: Payer: Medicare HMO

## 2023-03-14 ENCOUNTER — Ambulatory Visit: Payer: Medicare HMO | Admitting: Podiatry

## 2023-03-14 DIAGNOSIS — M19072 Primary osteoarthritis, left ankle and foot: Secondary | ICD-10-CM

## 2023-03-14 DIAGNOSIS — M21612 Bunion of left foot: Secondary | ICD-10-CM | POA: Diagnosis not present

## 2023-03-14 NOTE — Progress Notes (Signed)
Subjective:   Patient ID: Ashley Shaffer, female   DOB: 72 y.o.   MRN: [id number]   HPI Chief Complaint  Patient presents with   Bunions    Patient reports left foot pain caused by bunion. X-ray done at a another office     72 year old female presents the above concerns.  She states that she has had bunion surgery on her right foot that she is also has had a continued left foot and was issue with the bone on the left side.  She is continuing to get pain on the bunion but she is currently on Celebrex for spine arthritis and scoliosis that she denies any pain of the bunion.  She is interested in surgery possibly next year but was not of any surgical intervention for now.  She can use a toe spacer between the third and fourth toes to prevent testing crossing over.  She is diabetic and it was good until her back injection last week. She is getting her A1c check later this week. Last A1c 8.6 on 11/30/2022.    Review of Systems  All other systems reviewed and are negative.  Past Medical History:  Diagnosis Date   Arthritis    Basal cell carcinoma    Depression    Diabetes mellitus without complication (HCC)    type II   Eczema    Hyperlipidemia    Ischemic colitis (HCC)    Left ACL tear    Osteopenia    Thyroid disease    thyroid removed age 40 due to benign mass   Vertigo     Past Surgical History:  Procedure Laterality Date   BREAST BIOPSY Left    Benign   BREAST MASS EXCISION Right 1998   BUNIONECTOMY  1997   KNEE ARTHROSCOPY  12/04/2007   REPLACEMENT TOTAL KNEE Left 02/21/2022   REPLACEMENT TOTAL KNEE Right 07/2022   SHOULDER SURGERY Right 08/10/2006   THYROIDECTOMY  1978   due to enlarged goiter   TONSILLECTOMY     TOTAL KNEE ARTHROPLASTY Left 02/21/2022   Procedure: LEFT TOTAL KNEE ARTHROPLASTY;  Surgeon: Joen Laura, MD;  Location: [facility];  Service: Orthopedics;  Laterality: Left;   TUBAL LIGATION       Current Outpatient Medications:    ACCU-CHEK  SOFTCLIX LANCETS lancets, Use one lancet each time sugars are tested. Dx. E11.9, Disp: 300 each, Rfl: 3   augmented betamethasone dipropionate (DIPROLENE-AF) 0.05 % cream, Apply topically 2 (two) times daily., Disp: , Rfl:    buPROPion ER (WELLBUTRIN SR) 100 MG 12 hr tablet, TAKE 1 TABLET(100 MG) BY MOUTH TWICE DAILY, Disp: 180 tablet, Rfl: 1   Calcium Citrate-Vitamin D (CALCIUM CITRATE + D) 250-5 MG-MCG TABS, Take 2 tablets by mouth daily., Disp: , Rfl:    celecoxib (CELEBREX) 100 MG capsule, Take 1 capsule (100 mg total) by mouth 2 (two) times daily., Disp: 180 capsule, Rfl: 1   clobetasol (TEMOVATE) 0.05 % external solution, Apply topically 2 (two) times daily. to affected area, Disp: , Rfl:    fexofenadine (ALLEGRA) 60 MG tablet, Take 60 mg by mouth daily. (Patient not taking: Reported on 01/17/2023), Disp: , Rfl:    glucose blood (ACCU-CHEK AVIVA PLUS) test strip, Please use one strip each time sugars are tested. Pt checks sugars three times daily. Dx E11.9, Disp: 300 each, Rfl: 3   halobetasol (ULTRAVATE) 0.05 % cream, Apply topically., Disp: , Rfl:    JARDIANCE 10 MG TABS tablet, TAKE 1 TABLET(10 MG)  BY MOUTH DAILY BEFORE BREAKFAST, Disp: 30 tablet, Rfl: 3   levocetirizine (XYZAL) 2.5 MG/5ML solution, Take 2.5 mg by mouth every evening., Disp: , Rfl:    levothyroxine (SYNTHROID) 150 MCG tablet, TAKE 1 TABLET BY MOUTH EVERY DAY BEFORE BREAKFAST, Disp: 90 tablet, Rfl: 1   meclizine (ANTIVERT) 25 MG tablet, Take 1 tablet (25 mg total) by mouth 3 (three) times daily as needed for dizziness., Disp: 30 tablet, Rfl: 0   metFORMIN (GLUCOPHAGE-XR) 500 MG 24 hr tablet, TAKE 2 TABLETS(1000 MG) BY MOUTH TWICE DAILY, Disp: 360 tablet, Rfl: 1   methocarbamol (ROBAXIN) 500 MG tablet, Take 500 mg by mouth 2 (two) times daily., Disp: , Rfl:    methotrexate (RHEUMATREX) 2.5 MG tablet, Take 10 mg by mouth once a week., Disp: , Rfl:    metroNIDAZOLE (METROGEL) 1 % gel, Apply topically., Disp: , Rfl:    omeprazole  (PRILOSEC) 20 MG capsule, Take 1 capsule (20 mg total) by mouth daily. (Patient not taking: Reported on 01/17/2023), Disp: 30 capsule, Rfl: 3   pimecrolimus (ELIDEL) 1 % cream, Apply topically., Disp: , Rfl:    saxagliptin HCl (ONGLYZA) 2.5 MG TABS tablet, TAKE 2 TABLETS BY MOUTH DAILY, Disp: 180 tablet, Rfl: 1  No Known Allergies        Objective:  Physical Exam  General: AAO x3, NAD  Dermatological: Mild erythema on the medial first metatarsal head on the area the bunion from irritation there is no skin breakdown, warmth or any signs of infection today.  No open lesions.  Vascular: Dorsalis Pedis artery and Posterior Tibial artery pedal pulses are 2/4 bilateral with immedate capillary fill time. There is no pain with calf compression, swelling, warmth, erythema.   Neruologic: Grossly intact via light touch bilateral.   Musculoskeletal: Significant bunion present on the left foot.  Digital contractures are present.  She subjectively gets tenderness in the area of the bunion but no significant pain today.  Muscular strength 5/5 in all groups tested bilateral.  Gait: Unassisted, Nonantalgic.       Assessment:   72 year old female with severe bunion left foot, hammertoe/digital fomites     Plan:  -Treatment options discussed including all alternatives, risks, and complications -Etiology of symptoms were discussed -I have independently reviewed the x-rays with the patient from Dr. Gregary Cromer office.  Arthritic changes present the midfoot and severe bunions present with decreased joint space noted on the first MPJ as well. -We discussed the conservative as well as surgical treatment options.  She was offered any surgical intervention for now.  I dispensed gel bunion pads.  Continue toe secondary to the third and fourth toes help with the contracture of the toes. -We discussed surgical intervention and prior to surgery next year if she wants to proceed with the recommended CT scan to  evaluate arthritis prior to surgery.  *Get weightbearing x-rays next appointment if she wants to proceed with surgery  Vivi Barrack DPM

## 2023-03-14 NOTE — Patient Instructions (Signed)

## 2023-03-16 ENCOUNTER — Other Ambulatory Visit: Payer: Self-pay | Admitting: Family Medicine

## 2023-03-16 ENCOUNTER — Encounter: Payer: Self-pay | Admitting: Family Medicine

## 2023-03-16 ENCOUNTER — Ambulatory Visit (INDEPENDENT_AMBULATORY_CARE_PROVIDER_SITE_OTHER): Payer: Medicare HMO | Admitting: Family Medicine

## 2023-03-16 VITALS — BP 106/72 | HR 63 | Temp 97.9°F | Resp 17 | Ht 64.0 in | Wt 133.4 lb

## 2023-03-16 DIAGNOSIS — E119 Type 2 diabetes mellitus without complications: Secondary | ICD-10-CM

## 2023-03-16 DIAGNOSIS — E038 Other specified hypothyroidism: Secondary | ICD-10-CM

## 2023-03-16 DIAGNOSIS — Z7984 Long term (current) use of oral hypoglycemic drugs: Secondary | ICD-10-CM

## 2023-03-16 DIAGNOSIS — Z1211 Encounter for screening for malignant neoplasm of colon: Secondary | ICD-10-CM

## 2023-03-16 LAB — CBC WITH DIFFERENTIAL/PLATELET
Basophils Absolute: 0 10*3/uL (ref 0.0–0.1)
Basophils Relative: 0.4 % (ref 0.0–3.0)
Eosinophils Absolute: 0.4 10*3/uL (ref 0.0–0.7)
Eosinophils Relative: 7.3 % — ABNORMAL HIGH (ref 0.0–5.0)
HCT: 43.4 % (ref 36.0–46.0)
Hemoglobin: 14.3 g/dL (ref 12.0–15.0)
Lymphocytes Relative: 17.4 % (ref 12.0–46.0)
Lymphs Abs: 1 10*3/uL (ref 0.7–4.0)
MCHC: 33 g/dL (ref 30.0–36.0)
MCV: 91.2 fl (ref 78.0–100.0)
Monocytes Absolute: 0.6 10*3/uL (ref 0.1–1.0)
Monocytes Relative: 10.5 % (ref 3.0–12.0)
Neutro Abs: 3.6 10*3/uL (ref 1.4–7.7)
Neutrophils Relative %: 64.4 % (ref 43.0–77.0)
Platelets: 358 10*3/uL (ref 150.0–400.0)
RBC: 4.76 Mil/uL (ref 3.87–5.11)
RDW: 13.9 % (ref 11.5–15.5)
WBC: 5.7 10*3/uL (ref 4.0–10.5)

## 2023-03-16 LAB — BASIC METABOLIC PANEL
BUN: 17 mg/dL (ref 6–23)
CO2: 29 mEq/L (ref 19–32)
Calcium: 9.4 mg/dL (ref 8.4–10.5)
Chloride: 99 mEq/L (ref 96–112)
Creatinine, Ser: 0.72 mg/dL (ref 0.40–1.20)
GFR: 83.67 mL/min (ref >60.00)
Glucose, Bld: 189 mg/dL — ABNORMAL HIGH (ref 70–99)
Potassium: 3.8 mEq/L (ref 3.5–5.1)
Sodium: 136 mEq/L (ref 135–145)

## 2023-03-16 LAB — LIPID PANEL
Cholesterol: 187 mg/dL (ref 0–200)
HDL: 69.3 mg/dL (ref >39.00)
LDL Cholesterol: 97 mg/dL (ref 0–99)
NonHDL: 117.28
Total CHOL/HDL Ratio: 3
Triglycerides: 102 mg/dL (ref 0.0–149.0)
VLDL: 20.4 mg/dL (ref 0.0–40.0)

## 2023-03-16 LAB — TSH: TSH: 1.37 u[IU]/mL (ref 0.35–5.50)

## 2023-03-16 LAB — HEPATIC FUNCTION PANEL
ALT: 15 U/L (ref 0–35)
AST: 18 U/L (ref 0–37)
Albumin: 4.3 g/dL (ref 3.5–5.2)
Alkaline Phosphatase: 70 U/L (ref 39–117)
Bilirubin, Direct: 0.1 mg/dL (ref 0.0–0.3)
Total Bilirubin: 0.6 mg/dL (ref 0.2–1.2)
Total Protein: 7 g/dL (ref 6.0–8.3)

## 2023-03-16 LAB — HEMOGLOBIN A1C: Hgb A1c MFr Bld: 6.9 % — ABNORMAL HIGH (ref 4.6–6.5)

## 2023-03-16 MED ORDER — EMPAGLIFLOZIN 10 MG PO TABS: ORAL_TABLET | ORAL | 1 refills | Status: DC

## 2023-03-16 NOTE — Patient Instructions (Signed)
Follow up in 3-4 months to recheck sugar We'll notify you of your lab results and make any changes if needed Keep up the good work on healthy diet and regular exercise- you look great! Call with any questions or concerns Stay Safe!  Stay Healthy! Have a great summer!

## 2023-03-16 NOTE — Progress Notes (Signed)
   Subjective:    Patient ID: Ashley Shaffer, female    DOB: 06/23/1951, 72 y.o.   MRN: [id number]  HPI DM- chronic problem, on Jardiance 10mg  daily, Onglyza 5mg  daily, and Metformin XR 1000mg  BID.  Last A1C 8.6% which is when Jardiance was added.  UTD on eye exam, foot exam, microalbumin.  Joined [employer] last week.  No CP, SOB, HA's, visual changes, abd pain, N/V.  No numbness/tingling of hands/feet.  Hypothyroid- chronic problem, on Levothyroxine daily.  No changes to skin/hair/nails.  Energy level has been good.   Review of Systems For ROS see HPI     Objective:   Physical Exam Vitals reviewed.  Constitutional:      General: She is not in acute distress.    Appearance: Normal appearance. She is well-developed. She is not ill-appearing.  HENT:     Head: Normocephalic and atraumatic.  Eyes:     Conjunctiva/sclera: Conjunctivae normal.     Pupils: Pupils are equal, round, and reactive to light.  Neck:     Thyroid: No thyromegaly.  Cardiovascular:     Rate and Rhythm: Normal rate and regular rhythm.     Pulses: Normal pulses.     Heart sounds: Normal heart sounds. No murmur heard. Pulmonary:     Effort: Pulmonary effort is normal. No respiratory distress.     Breath sounds: Normal breath sounds.  Abdominal:     General: There is no distension.     Palpations: Abdomen is soft.     Tenderness: There is no abdominal tenderness.  Musculoskeletal:     Cervical back: Normal range of motion and neck supple.     Right lower leg: No edema.     Left lower leg: No edema.  Lymphadenopathy:     Cervical: No cervical adenopathy.  Skin:    General: Skin is warm and dry.  Neurological:     General: No focal deficit present.     Mental Status: She is alert and oriented to person, place, and time.  Psychiatric:        Mood and Affect: Mood normal.        Behavior: Behavior normal.        Thought Content: Thought content normal.           Assessment & Plan:

## 2023-03-17 ENCOUNTER — Telehealth: Payer: Self-pay

## 2023-03-17 NOTE — Telephone Encounter (Signed)
-----   Message from Sheliah Hatch, MD sent at 03/17/2023  7:38 AM EDT ----- Labs look great!  No changes at this time

## 2023-03-17 NOTE — Telephone Encounter (Signed)
Pt seen results Via my chart  

## 2023-03-19 ENCOUNTER — Encounter: Payer: Self-pay | Admitting: Family Medicine

## 2023-03-19 NOTE — Assessment & Plan Note (Signed)
Chronic problem.  Currently on Levothyroxine 150mcg daily and asymptomatic.  Check labs.  Adjust meds prn  

## 2023-03-19 NOTE — Assessment & Plan Note (Signed)
Chronic problem.  Pt was started on Jardiance at last visit.  She reports sugars are much better since starting medication.  Now on Jardiance 10mg  daily, Onglyza 5mg  daily and Metformin XR 1000mg  BID.  UTD on eye exam, foot exam, microalbumin.  Has joined [employer].  Currently asymptomatic.  Check labs.  Adjust meds prn

## 2023-03-24 DIAGNOSIS — M62830 Muscle spasm of back: Secondary | ICD-10-CM | POA: Diagnosis not present

## 2023-03-24 DIAGNOSIS — M25551 Pain in right hip: Secondary | ICD-10-CM | POA: Diagnosis not present

## 2023-03-28 DIAGNOSIS — Z1211 Encounter for screening for malignant neoplasm of colon: Secondary | ICD-10-CM | POA: Diagnosis not present

## 2023-03-30 DIAGNOSIS — M47816 Spondylosis without myelopathy or radiculopathy, lumbar region: Secondary | ICD-10-CM | POA: Diagnosis not present

## 2023-04-02 LAB — COLOGUARD: COLOGUARD: NEGATIVE

## 2023-04-03 ENCOUNTER — Telehealth: Payer: Self-pay

## 2023-04-03 DIAGNOSIS — M67442 Ganglion, left hand: Secondary | ICD-10-CM | POA: Diagnosis not present

## 2023-04-03 DIAGNOSIS — M79645 Pain in left finger(s): Secondary | ICD-10-CM | POA: Diagnosis not present

## 2023-04-03 NOTE — Telephone Encounter (Signed)
-----   Message from Sheliah Hatch, MD sent at 04/03/2023  7:32 AM EDT ----- Normal cologuard- great news!

## 2023-04-03 NOTE — Telephone Encounter (Signed)
Pt aware of lab results 

## 2023-04-04 DIAGNOSIS — M7061 Trochanteric bursitis, right hip: Secondary | ICD-10-CM | POA: Diagnosis not present

## 2023-04-13 DIAGNOSIS — M71342 Other bursal cyst, left hand: Secondary | ICD-10-CM | POA: Diagnosis not present

## 2023-04-13 DIAGNOSIS — M25742 Osteophyte, left hand: Secondary | ICD-10-CM | POA: Diagnosis not present

## 2023-04-13 DIAGNOSIS — M67442 Ganglion, left hand: Secondary | ICD-10-CM | POA: Diagnosis not present

## 2023-04-14 DIAGNOSIS — M62830 Muscle spasm of back: Secondary | ICD-10-CM | POA: Diagnosis not present

## 2023-04-14 DIAGNOSIS — M25551 Pain in right hip: Secondary | ICD-10-CM | POA: Diagnosis not present

## 2023-04-17 DIAGNOSIS — Z79899 Other long term (current) drug therapy: Secondary | ICD-10-CM | POA: Diagnosis not present

## 2023-04-17 DIAGNOSIS — L308 Other specified dermatitis: Secondary | ICD-10-CM | POA: Diagnosis not present

## 2023-04-19 DIAGNOSIS — L308 Other specified dermatitis: Secondary | ICD-10-CM | POA: Diagnosis not present

## 2023-04-24 DIAGNOSIS — M62830 Muscle spasm of back: Secondary | ICD-10-CM | POA: Diagnosis not present

## 2023-04-24 DIAGNOSIS — M25551 Pain in right hip: Secondary | ICD-10-CM | POA: Diagnosis not present

## 2023-05-02 ENCOUNTER — Other Ambulatory Visit: Payer: Self-pay | Admitting: Family Medicine

## 2023-05-09 ENCOUNTER — Other Ambulatory Visit: Payer: Self-pay | Admitting: Family Medicine

## 2023-05-09 DIAGNOSIS — Z1231 Encounter for screening mammogram for malignant neoplasm of breast: Secondary | ICD-10-CM

## 2023-05-13 ENCOUNTER — Other Ambulatory Visit: Payer: Self-pay | Admitting: Family Medicine

## 2023-05-14 DIAGNOSIS — M62830 Muscle spasm of back: Secondary | ICD-10-CM | POA: Diagnosis not present

## 2023-05-14 DIAGNOSIS — M25551 Pain in right hip: Secondary | ICD-10-CM | POA: Diagnosis not present

## 2023-05-24 DIAGNOSIS — M62830 Muscle spasm of back: Secondary | ICD-10-CM | POA: Diagnosis not present

## 2023-05-24 DIAGNOSIS — M25551 Pain in right hip: Secondary | ICD-10-CM | POA: Diagnosis not present

## 2023-05-31 ENCOUNTER — Encounter: Payer: Self-pay | Admitting: Family Medicine

## 2023-05-31 ENCOUNTER — Telehealth: Payer: Self-pay | Admitting: Family Medicine

## 2023-05-31 ENCOUNTER — Telehealth (INDEPENDENT_AMBULATORY_CARE_PROVIDER_SITE_OTHER): Payer: Medicare HMO | Admitting: Family Medicine

## 2023-05-31 DIAGNOSIS — H9202 Otalgia, left ear: Secondary | ICD-10-CM

## 2023-05-31 NOTE — Telephone Encounter (Signed)
Per Dr Beverely Low pt refuses the statin [employer] has been made aware of pt decision

## 2023-05-31 NOTE — Telephone Encounter (Signed)
Received forms from [facility] Printed & placed in provider bin

## 2023-05-31 NOTE — Progress Notes (Signed)
Virtual Visit via Video   I connected with patient on 05/31/23 at  9:00 AM EDT by a video enabled telemedicine application and verified that I am speaking with the correct person using two identifiers.  Location patient: Home Location provider: [place] [facility], Office Persons participating in the virtual visit: Patient, Provider, CMA Sheryle Hail C)  I discussed the limitations of evaluation and management by telemedicine and the availability of in person appointments. The patient expressed understanding and agreed to proceed.  Subjective:   HPI:   Ear pain- pt just returned home from [place] on Monday.  Reports everyone in their travel group developed the 'same cold'.  Had pain in L ear.  No fevers, no body aches.  Took left over Keflex 500mg  BID and OTC mucinex.  Now feeling better.  Did not take a COVID test.  ROS:   See pertinent positives and negatives per HPI.  Patient Active Problem List   Diagnosis Date Noted   Generalized osteoarthritis 01/17/2023   Positive ANA (antinuclear antibody) 01/17/2023   Digital mucous cyst of finger 01/17/2023   Vertigo 11/30/2022   Hyperlipidemia 04/03/2017   Retinal scar of left eye 06/29/2016   Physical exam 04/01/2016   Diabetes mellitus type II, controlled, with no complications (HCC) 01/27/2016   Hypothyroid 01/27/2016   Depression 01/27/2016   Idiopathic scoliosis 01/27/2016    Social History   Tobacco Use   Smoking status: Never    Passive exposure: Past   Smokeless tobacco: Never  Substance Use Topics   Alcohol use: Yes    Alcohol/week: 2.0 standard drinks of alcohol    Types: 2 Glasses of wine per week    Comment: weekly    Current Outpatient Medications:    ACCU-CHEK SOFTCLIX LANCETS lancets, Use one lancet each time sugars are tested. Dx. E11.9, Disp: 300 each, Rfl: 3   augmented betamethasone dipropionate (DIPROLENE-AF) 0.05 % cream, Apply topically 2 (two) times daily., Disp: , Rfl:    buPROPion ER (WELLBUTRIN  SR) 100 MG 12 hr tablet, TAKE 1 TABLET(100 MG) BY MOUTH TWICE DAILY, Disp: 180 tablet, Rfl: 1   celecoxib (CELEBREX) 100 MG capsule, Take 1 capsule (100 mg total) by mouth 2 (two) times daily., Disp: 180 capsule, Rfl: 1   clobetasol (TEMOVATE) 0.05 % external solution, Apply topically 2 (two) times daily. to affected area, Disp: , Rfl:    empagliflozin (JARDIANCE) 10 MG TABS tablet, TAKE 1 TABLET(10 MG) BY MOUTH DAILY BEFORE BREAKFAST, Disp: 90 tablet, Rfl: 1   glucose blood (ACCU-CHEK AVIVA PLUS) test strip, Please use one strip each time sugars are tested. Pt checks sugars three times daily. Dx E11.9, Disp: 300 each, Rfl: 3   halobetasol (ULTRAVATE) 0.05 % cream, Apply topically., Disp: , Rfl:    levocetirizine (XYZAL) 2.5 MG/5ML solution, Take 2.5 mg by mouth every evening., Disp: , Rfl:    levothyroxine (SYNTHROID) 150 MCG tablet, TAKE 1 TABLET BY MOUTH EVERY DAY BEFORE BREAKFAST, Disp: 90 tablet, Rfl: 1   metFORMIN (GLUCOPHAGE-XR) 500 MG 24 hr tablet, TAKE 2 TABLETS(1000 MG) BY MOUTH TWICE DAILY, Disp: 360 tablet, Rfl: 1   methotrexate (RHEUMATREX) 2.5 MG tablet, Take 10 mg by mouth once a week., Disp: , Rfl:    metroNIDAZOLE (METROGEL) 1 % gel, Apply topically., Disp: , Rfl:    pimecrolimus (ELIDEL) 1 % cream, Apply topically., Disp: , Rfl:    saxagliptin HCl (ONGLYZA) 2.5 MG TABS tablet, TAKE 2 TABLETS BY MOUTH DAILY, Disp: 180 tablet, Rfl: 1  methocarbamol (ROBAXIN) 500 MG tablet, Take 500 mg by mouth 2 (two) times daily., Disp: , Rfl:   No Known Allergies  Objective:   LMP 10/31/2004 (Approximate)  AAOx3, NAD NCAT, EOMI No obvious CN deficits Coloring WNL Pt is able to speak clearly, coherently without shortness of breath or increased work of breathing.  Thought process is linear.  Mood is appropriate.   Assessment and Plan:   L ear pain- new.  Pt reports she feels '100% better' since taking 3 days of leftover Keflex and Mucinex.  Has 5 days of abx remaining.  Encouraged her to  finish them.  No additional tx needed at this time.   Neena Rhymes, MD 05/31/2023

## 2023-06-14 ENCOUNTER — Encounter: Payer: Self-pay | Admitting: Family Medicine

## 2023-06-14 ENCOUNTER — Ambulatory Visit (INDEPENDENT_AMBULATORY_CARE_PROVIDER_SITE_OTHER): Payer: Medicare HMO | Admitting: Family Medicine

## 2023-06-14 VITALS — BP 110/68 | HR 48 | Temp 98.0°F | Resp 17 | Ht 64.0 in | Wt 131.0 lb

## 2023-06-14 DIAGNOSIS — J329 Chronic sinusitis, unspecified: Secondary | ICD-10-CM

## 2023-06-14 DIAGNOSIS — B9689 Other specified bacterial agents as the cause of diseases classified elsewhere: Secondary | ICD-10-CM | POA: Diagnosis not present

## 2023-06-14 MED ORDER — AMOXICILLIN 875 MG PO TABS: 875.0000 mg | ORAL_TABLET | Freq: Two times a day (BID) | ORAL | 0 refills | Status: AC

## 2023-06-14 NOTE — Patient Instructions (Signed)
Follow up as needed or as scheduled Start the Amoxicillin twice daily- take w/ food Drink LOTS of fluids REST!! Call with any questions or concerns FEEL BETTER!!!

## 2023-06-14 NOTE — Progress Notes (Signed)
   Subjective:    Patient ID: Ashley Shaffer, female    DOB: August 14, 1951, 72 y.o.   MRN: [id number]  HPI URI- sxs started ~2 weeks ago.  'it's all sinuses'.  No fever.  + facial TTP.  No tooth pain.  + fatigue.  No ear pain.  + PND.  No dizziness.  + HA.   Review of Systems For ROS see HPI     Objective:   Physical Exam Vitals reviewed.  Constitutional:      General: She is not in acute distress.    Appearance: Normal appearance. She is well-developed. She is not ill-appearing.  HENT:     Head: Normocephalic and atraumatic.     Right Ear: Tympanic membrane normal.     Left Ear: Tympanic membrane normal.     Nose: Mucosal edema and congestion present. No rhinorrhea.     Right Sinus: Maxillary sinus tenderness and frontal sinus tenderness present.     Left Sinus: Maxillary sinus tenderness and frontal sinus tenderness present.     Mouth/Throat:     Pharynx: Uvula midline. Posterior oropharyngeal erythema present. No oropharyngeal exudate.  Eyes:     Conjunctiva/sclera: Conjunctivae normal.     Pupils: Pupils are equal, round, and reactive to light.  Cardiovascular:     Rate and Rhythm: Normal rate and regular rhythm.     Heart sounds: Normal heart sounds.  Pulmonary:     Effort: Pulmonary effort is normal. No respiratory distress.     Breath sounds: Normal breath sounds. No wheezing.  Musculoskeletal:     Cervical back: Normal range of motion and neck supple.  Lymphadenopathy:     Cervical: No cervical adenopathy.  Skin:    General: Skin is warm and dry.  Neurological:     General: No focal deficit present.     Mental Status: She is alert and oriented to person, place, and time.     Cranial Nerves: No cranial nerve deficit.     Motor: No weakness.     Coordination: Coordination normal.  Psychiatric:        Mood and Affect: Mood normal.        Behavior: Behavior normal.        Thought Content: Thought content normal.           Assessment & Plan:  Bacterial  sinusitis- pt's sxs and PE consistent w/ infxn.  Start Amoxicillin twice daily.  Reviewed supportive care and red flags that should prompt return.  Pt expressed understanding and is in agreement w/ plan.

## 2023-06-15 ENCOUNTER — Encounter (INDEPENDENT_AMBULATORY_CARE_PROVIDER_SITE_OTHER): Payer: Self-pay

## 2023-06-19 ENCOUNTER — Ambulatory Visit: Payer: Medicare HMO

## 2023-06-22 ENCOUNTER — Encounter: Payer: Self-pay | Admitting: Family Medicine

## 2023-06-24 DIAGNOSIS — M62830 Muscle spasm of back: Secondary | ICD-10-CM | POA: Diagnosis not present

## 2023-06-24 DIAGNOSIS — M25551 Pain in right hip: Secondary | ICD-10-CM | POA: Diagnosis not present

## 2023-06-26 ENCOUNTER — Other Ambulatory Visit: Payer: Self-pay | Admitting: Family Medicine

## 2023-06-26 DIAGNOSIS — E038 Other specified hypothyroidism: Secondary | ICD-10-CM

## 2023-07-05 ENCOUNTER — Ambulatory Visit (INDEPENDENT_AMBULATORY_CARE_PROVIDER_SITE_OTHER): Payer: Medicare HMO | Admitting: Family Medicine

## 2023-07-05 ENCOUNTER — Encounter: Payer: Self-pay | Admitting: Family Medicine

## 2023-07-05 ENCOUNTER — Telehealth: Payer: Self-pay

## 2023-07-05 VITALS — BP 108/68 | HR 76 | Temp 97.8°F | Resp 17 | Wt 130.5 lb

## 2023-07-05 DIAGNOSIS — Z7984 Long term (current) use of oral hypoglycemic drugs: Secondary | ICD-10-CM

## 2023-07-05 DIAGNOSIS — Z23 Encounter for immunization: Secondary | ICD-10-CM | POA: Diagnosis not present

## 2023-07-05 DIAGNOSIS — E119 Type 2 diabetes mellitus without complications: Secondary | ICD-10-CM | POA: Diagnosis not present

## 2023-07-05 LAB — BASIC METABOLIC PANEL
BUN: 19 mg/dL (ref 6–23)
CO2: 29 meq/L (ref 19–32)
Calcium: 9.5 mg/dL (ref 8.4–10.5)
Chloride: 99 meq/L (ref 96–112)
Creatinine, Ser: 0.68 mg/dL (ref 0.40–1.20)
GFR: 86.96 mL/min (ref >60.00)
Glucose, Bld: 154 mg/dL — ABNORMAL HIGH (ref 70–99)
Potassium: 4.3 meq/L (ref 3.5–5.1)
Sodium: 136 meq/L (ref 135–145)

## 2023-07-05 LAB — HEMOGLOBIN A1C: Hgb A1c MFr Bld: 7.1 % — ABNORMAL HIGH (ref 4.6–6.5)

## 2023-07-05 NOTE — Assessment & Plan Note (Signed)
Chronic problem.  Currently doing well on Jardiance, Metformin, and Onglyza.  UTD on eye exam, foot exam, microalbumin.  Currently asymptomatic.  Check labs.  Adjust meds prn

## 2023-07-05 NOTE — Telephone Encounter (Signed)
Pt is aware of lab results.

## 2023-07-05 NOTE — Addendum Note (Signed)
Addended byJaci Lazier, Tobias Avitabile on: 07/05/2023 09:08 AM   Modules accepted: Orders

## 2023-07-05 NOTE — Patient Instructions (Signed)
Schedule your complete physical in 3-4 months We'll notify you of your lab results and make any changes if needed Keep up the good work on healthy diet and regular exercise- you look great! Call with any questions or concerns Stay Safe!  Stay Healthy! Happy Fall!!!

## 2023-07-05 NOTE — Addendum Note (Signed)
Addended by: Sheliah Hatch on: 07/05/2023 09:29 AM   Modules accepted: Level of Service

## 2023-07-05 NOTE — Progress Notes (Signed)
   Subjective:    Patient ID: Ashley Shaffer, female    DOB: 05-06-1951, 72 y.o.   MRN: [id number]  HPI DM- chronic problem.  On Jardiance 10mg  daily, Metformin XR 1000mg  daily, Onglyza 5mg  daily.  UTD on eye exam, foot exam, microalbumin.  Pt reports feeling great.  Ate PB sandwich this morning.  Denies CP, SOB, HA's, visual changes, abd pain, N/V.  No symptomatic lows.  No numbness/tingling of hands/feet.   Review of Systems For ROS see HPI     Objective:   Physical Exam Vitals reviewed.  Constitutional:      General: She is not in acute distress.    Appearance: Normal appearance. She is well-developed. She is not ill-appearing.  HENT:     Head: Normocephalic and atraumatic.  Eyes:     Conjunctiva/sclera: Conjunctivae normal.     Pupils: Pupils are equal, round, and reactive to light.  Neck:     Thyroid: No thyromegaly.  Cardiovascular:     Rate and Rhythm: Normal rate and regular rhythm.     Pulses: Normal pulses.     Heart sounds: Normal heart sounds. No murmur heard. Pulmonary:     Effort: Pulmonary effort is normal. No respiratory distress.     Breath sounds: Normal breath sounds.  Abdominal:     General: There is no distension.     Palpations: Abdomen is soft.     Tenderness: There is no abdominal tenderness.  Musculoskeletal:     Cervical back: Normal range of motion and neck supple.     Right lower leg: No edema.     Left lower leg: No edema.  Lymphadenopathy:     Cervical: No cervical adenopathy.  Skin:    General: Skin is warm and dry.  Neurological:     Mental Status: She is alert and oriented to person, place, and time. Mental status is at baseline.  Psychiatric:        Mood and Affect: Mood normal.        Behavior: Behavior normal.        Thought Content: Thought content normal.           Assessment & Plan:

## 2023-07-05 NOTE — Telephone Encounter (Signed)
-----   Message from Neena Rhymes sent at 07/05/2023  3:49 PM EDT ----- Labs are stable and look good.  No changes at this time.  I suspect the small increase in A1C is due to your recent travel.  No cause for concern.

## 2023-07-06 IMAGING — MR MR HEAD W/O CM
12 of 13 series · 44 of 48 positions shown · non-contrast
Comparison: None.

CLINICAL DATA: Dizziness

EXAM:
MRI HEAD WITHOUT CONTRAST
TECHNIQUE: Multiplanar, multiecho pulse sequences of the brain and surrounding
structures were obtained without intravenous contrast.

[Series 5: DWI · axial · 3.0mm · 0.88mm/px · z∈[-106,+52]mm · 7 of 108 slices shown (1 of 4)]
[im 1/108]
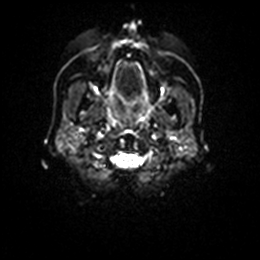
[im 18/108]
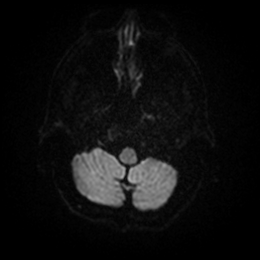
[im 36/108]
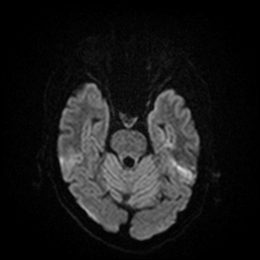
[im 54/108]
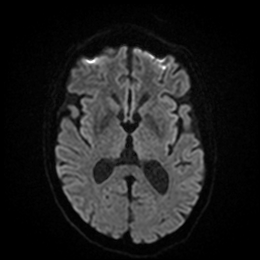
[im 72/108]
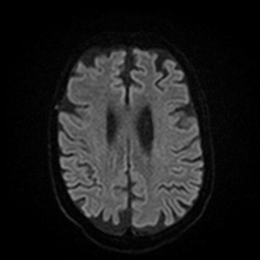
[im 90/108]
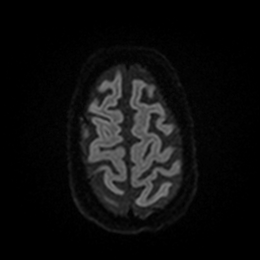
[im 108/108]
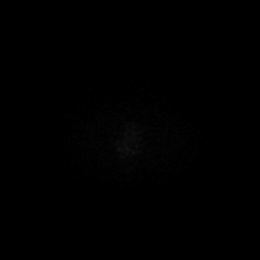

[Series 6: DWI · axial · 3.0mm · 0.88mm/px · z∈[-106,+52]mm · 4 of 53 slices shown (2 of 4)]
[im 1/53]
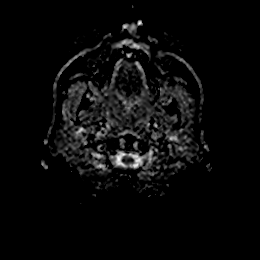
[im 18/53]
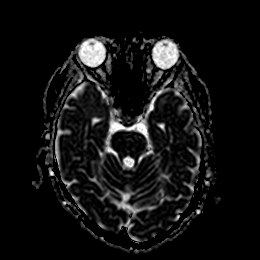
[im 35/53]
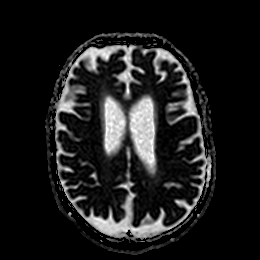
[im 53/53]
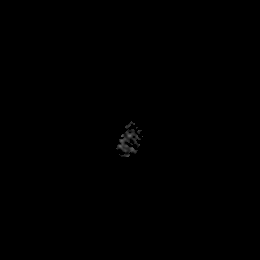

[Series 7: T1 · sagittal · 5.0mm · 0.75mm/px · 2 of 25 slices shown]
[im 1/25]
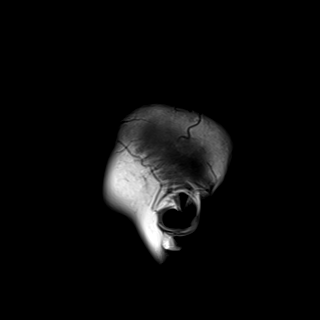
[im 25/25]
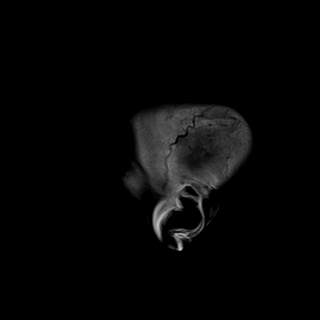

[Series 8: DWI · coronal · 4.0mm · 0.88mm/px · 6 of 76 slices shown (3 of 4)]
[im 1/76]
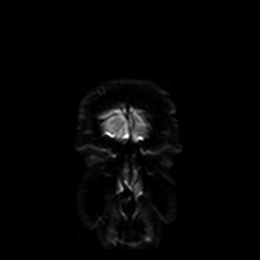
[im 16/76]
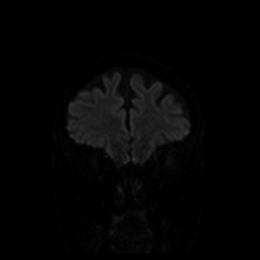
[im 31/76]
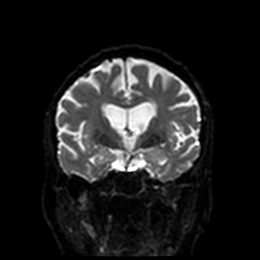
[im 46/76]
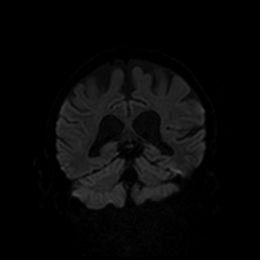
[im 61/76]
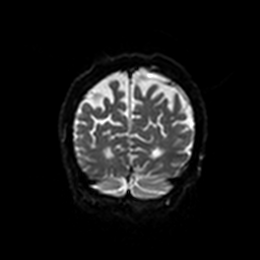
[im 76/76]
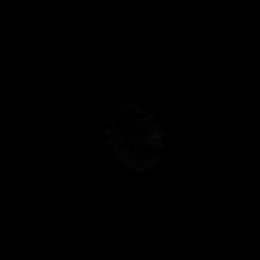

[Series 9: DWI · coronal · 4.0mm · 0.88mm/px · 3 of 38 slices shown (4 of 4)]
[im 1/38]
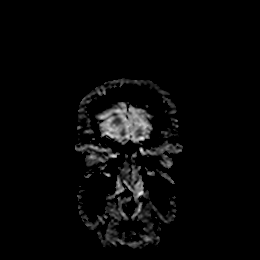
[im 19/38]
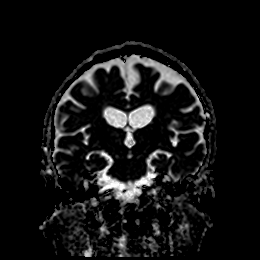
[im 38/38]
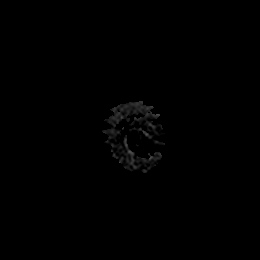

[Series 10: T2 · axial · 5.0mm · 0.72mm/px · z∈[-108,+53]mm · 2 of 28 slices shown (1 of 2)]
[im 1/28]
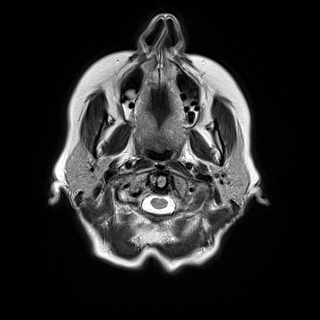
[im 28/28]
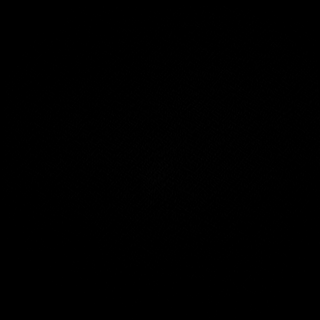

[Series 11: FLAIR · axial · 5.0mm · 0.45mm/px · z∈[-107,+54]mm · 2 of 28 slices shown]
[im 1/28]
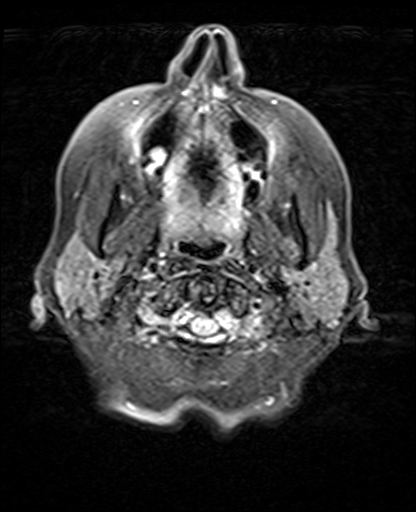
[im 28/28]
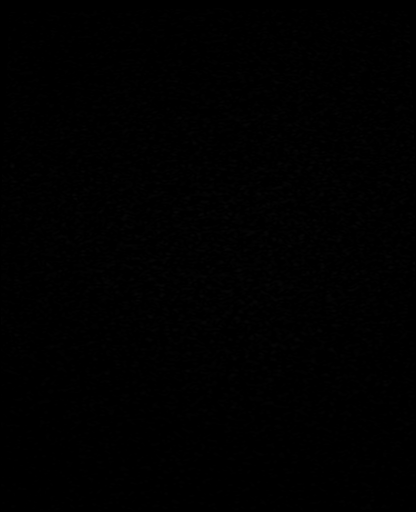

[Series 12: mag_images · axial · 3.0mm · 0.90mm/px · z∈[-108,+55]mm · 4 of 56 slices shown]
[im 1/56]
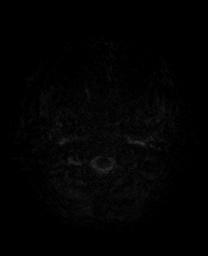
[im 19/56]
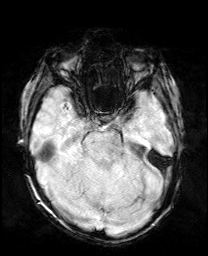
[im 37/56]
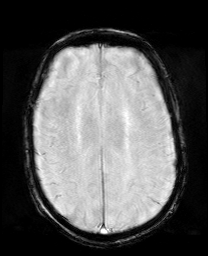
[im 56/56]
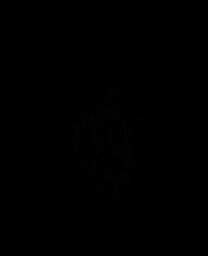

[Series 13: pha_images · axial · 3.0mm · 0.90mm/px · z∈[-102,+52]mm · 4 of 50 slices shown]
[im 1/50]
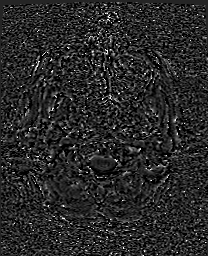
[im 17/50]
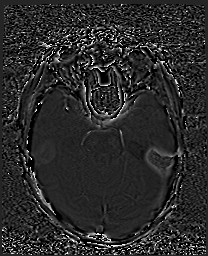
[im 33/50]
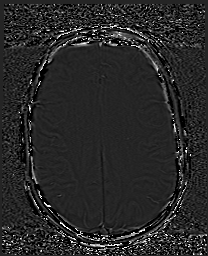
[im 50/50]
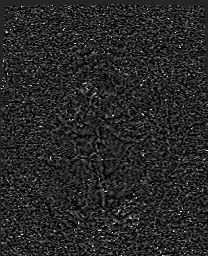

[Series 14: swi_images · axial · 3.0mm · 0.90mm/px · z∈[-108,+55]mm · 4 of 56 slices shown]
[im 1/56]
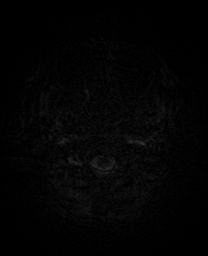
[im 19/56]
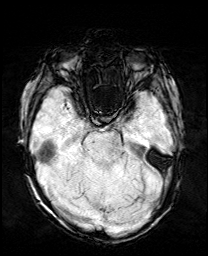
[im 37/56]
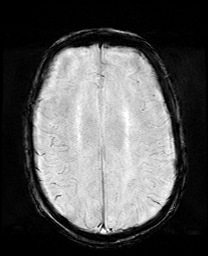
[im 56/56]
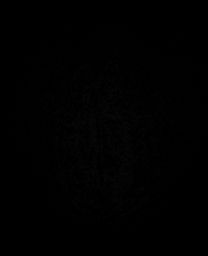

[Series 15: mip_images(sw) · axial · 24.0mm · 0.90mm/px · z∈[-98,+45]mm · 4 of 49 slices shown]
[im 1/49]
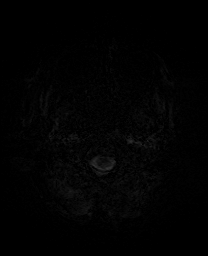
[im 17/49]
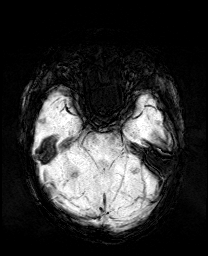
[im 33/49]
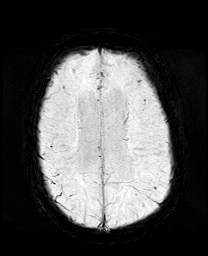
[im 49/49]
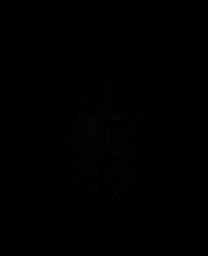

[Series 17: T2 · coronal · 5.0mm · 0.34mm/px · 2 of 33 slices shown (2 of 2)]
[im 1/33]
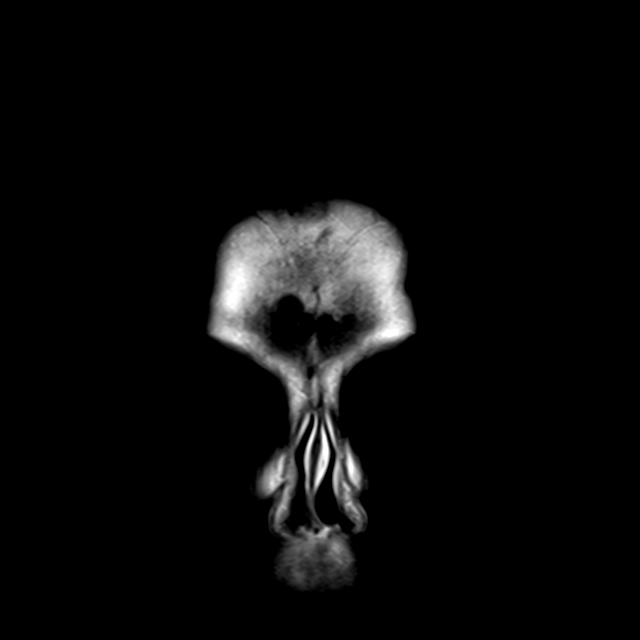
[im 33/33]
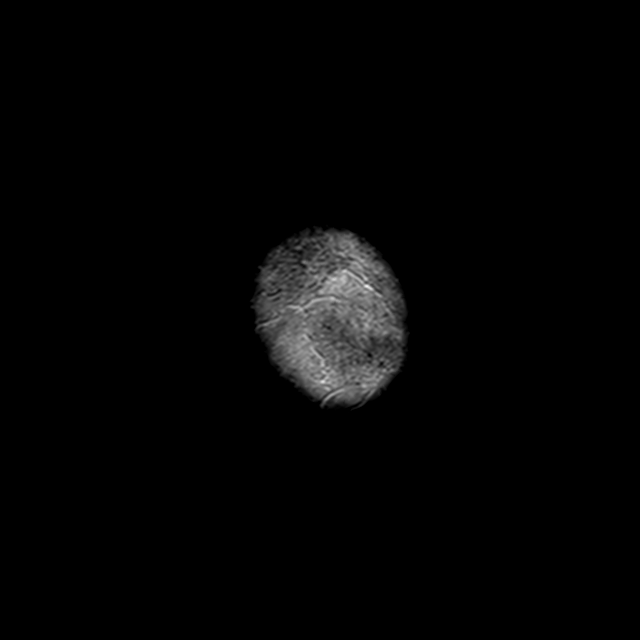

[44 of 48 positions shown; findings below may reference images not displayed]

FINDINGS: Brain: No acute infarct, mass effect or extra-axial collection. No
acute or chronic hemorrhage. There is multifocal hyperintense
T2-weighted signal within the white matter. Generalized volume loss
without a clear lobar predilection. The midline structures are
normal.

Vascular: Major flow voids are preserved.

Skull and upper cervical spine: Normal calvarium and skull base.
Visualized upper cervical spine and soft tissues are normal.

Sinuses/Orbits:No paranasal sinus fluid levels or advanced mucosal
thickening. No mastoid or middle ear effusion. Normal orbits.
IMPRESSION: 1. No acute intracranial abnormality.
2. Findings of chronic microvascular ischemia and generalized volume
loss.

## 2023-07-20 ENCOUNTER — Telehealth: Payer: Self-pay | Admitting: Family Medicine

## 2023-07-20 ENCOUNTER — Other Ambulatory Visit: Payer: Self-pay

## 2023-07-20 DIAGNOSIS — E785 Hyperlipidemia, unspecified: Secondary | ICD-10-CM

## 2023-07-20 MED ORDER — SAXAGLIPTIN HCL 5 MG PO TABS: 5.0000 mg | ORAL_TABLET | Freq: Every day | ORAL | 1 refills | Status: AC

## 2023-07-20 NOTE — Telephone Encounter (Signed)
Sent in Onglyza to 5mg  daily, #90, 1 refill

## 2023-07-20 NOTE — Telephone Encounter (Signed)
There is a request coming via fax that is asking for a change in Rx for cost purposes

## 2023-07-20 NOTE — Telephone Encounter (Signed)
Her prescription is written as 2 2.5mg  tabs daily (for a total of 5mg ) b/c of the cost issue.  So she's already on the lower cost 2.5mg  pills

## 2023-07-20 NOTE — Telephone Encounter (Signed)
Spoke with pt and she states since she has changed pharmacy ([facility]) the cost of 5 mg is cheaper than the 2 2.5. This is why she is requesting the 5 mg now

## 2023-07-20 NOTE — Telephone Encounter (Signed)
Ok to switch Onglyza to 5mg  daily, #90, 1 refill

## 2023-07-20 NOTE — Telephone Encounter (Signed)
Caller name: Haille Dingmann  On DPR?: Yes  Call back number: [phone] (home)  Provider they see: Sheliah Hatch, MD  Reason for call:   Pt called with [facility] on the line stating saxagliptin HCl (ONGLYZA) 2.5 MG TABS tablet is much cheaper in 5mg  form [facility] and they're sending a fax as back up.

## 2023-07-25 DIAGNOSIS — M25551 Pain in right hip: Secondary | ICD-10-CM | POA: Diagnosis not present

## 2023-07-25 DIAGNOSIS — M62830 Muscle spasm of back: Secondary | ICD-10-CM | POA: Diagnosis not present

## 2023-08-03 DIAGNOSIS — L308 Other specified dermatitis: Secondary | ICD-10-CM | POA: Diagnosis not present

## 2023-08-03 DIAGNOSIS — Z79899 Other long term (current) drug therapy: Secondary | ICD-10-CM | POA: Diagnosis not present

## 2023-08-07 DIAGNOSIS — L308 Other specified dermatitis: Secondary | ICD-10-CM | POA: Diagnosis not present

## 2023-08-16 ENCOUNTER — Other Ambulatory Visit: Payer: Self-pay | Admitting: Internal Medicine

## 2023-08-16 DIAGNOSIS — M159 Polyosteoarthritis, unspecified: Secondary | ICD-10-CM

## 2023-08-16 NOTE — Telephone Encounter (Signed)
Last Fill: 03/03/2023  Labs: 08/03/2023  Glucose 160  Next Visit: 09/04/2023  Last Visit: 03/03/2023  DX: Generalized osteoarthritis   Current Dose per office note 03/03/2023: Celebrex 100 mg twice daily   Okay to refill Celebrex?

## 2023-08-21 NOTE — Progress Notes (Signed)
Office Visit Note  Patient: Ashley Shaffer             Date of Birth: July 06, 1951           MRN: [id number]             PCP: Sheliah Hatch, MD Referring: Sheliah Hatch, MD Visit Date: 09/04/2023     Subjective:  Follow-up   History of Present Illness: Ashley Shaffer is a 72 y.o. female here for follow up for osteoarthritis joint pain in multiple areas.  She has been on Celebrex 100 mg twice daily with pitting improvement in most of her joint pain and stiffness.  Worst problem area had been in the lower legs increasing pain both in the feet and in calf muscles.  She ran out of Celebrex and had to take over-the-counter medicine combined famotidine with ibuprofen 600 mg comparable to her previous Duexis and felt this was very beneficial. Methotrexate dose was decreased from 4 to 3 tablets weekly by her dermatologist with good control of atopic dermatitis.  Also on the Elidel and Diprolene topical treatments.  She saw Dr. Frazier Butt for removal of right fifth DIP mucinous cyst that went well.  Saw Dr. Ardelle Anton about the left toe deformities recommended this would eventually require surgery which she is holding off on due to spending so much time in surgery and PT for both knee replacements last year.  Previous HPI 03/03/2023 Ashley Shaffer is a 72 y.o. female here for follow up for joint pain in multiple areas initial visit workup showing  negative for more specific antibody markers and no increase in systemic inflammatory markers. Xrays with degenerative arthritis of multiple areas and possible EOH distal joint changes. Symptoms ongoing about the same as seen in March still without much visible joint swelling.   Previous HPI 01/17/23 Ashley Shaffer is a 72 y.o. female here for evaluation of positive ANA checked in association with joint pains and skin rashes.  Long history of arthritis in multiple areas dating back at least about 10 years of noticeable symptoms.   Joint pain and stiffness in both hands as previously been treated with Celebrex with pretty good benefit did not improve a lot with ibuprofen.  She has not had any particular imaging of her hands.  Previously had arthroscopic repair of rotator cuff tendon and bilateral shoulders subsequently reinjured both of these.  She also had knee replacements on both sides over the course of last year and finished physical therapy within the past month.  She had 2 surgeries for right great toe bunion and revision without entirely satisfactory result so has left left foot unaddressed.  Symptoms are persistent but without very prolonged stiffness and feels her mobility and function are manageable on the Celebrex.  She stays active horse riding.  Most recently having some worsening of the bony nodules and deviation in her finger joints.  Especially with new cyst on her right small finger that has occasionally been draining clear fluid. New issue last year was development of atopic dermatitis.  This started on her torso subsequently progressing to involve her groin area.  This was treated with hydrocortisone treatment with minimal benefit.  She established with dermatology at Dr. Scharlene Gloss office started on methotrexate 1 month ago at 10 mg weekly dose.  She has upcoming follow-up planned for rechecking labs and possible medication adjustment.  So far the rash on her torso has partially responded groin rash is not changed much. She  has not noticed any new complaint of hair loss, photosensitivity, oral nasal ulcers, lymphadenopathy, or Raynaud's symptoms.   Labs reviewed 09/2022 ANA 1:40 speckled 1:40 cytoplasmic RF neg   Review of Systems  Constitutional:  Negative for fatigue.  HENT:  Negative for mouth sores and mouth dryness.   Eyes:  Negative for dryness.  Respiratory:  Negative for shortness of breath.   Cardiovascular:  Negative for chest pain and palpitations.  Gastrointestinal:  Negative for blood in stool,  constipation and diarrhea.  Endocrine: Negative for increased urination.  Genitourinary:  Negative for involuntary urination.  Musculoskeletal:  Positive for morning stiffness. Negative for joint pain, gait problem, joint pain, joint swelling, myalgias, muscle weakness, muscle tenderness and myalgias.  Skin:  Negative for color change, rash, hair loss and sensitivity to sunlight.  Allergic/Immunologic: Negative for susceptible to infections.  Neurological:  Negative for dizziness and headaches.  Hematological:  Negative for swollen glands.  Psychiatric/Behavioral:  Negative for depressed mood and sleep disturbance. The patient is not nervous/anxious.     PMFS History:  Patient Active Problem List   Diagnosis Date Noted   Generalized osteoarthritis 01/17/2023   Positive ANA (antinuclear antibody) 01/17/2023   Digital mucous cyst of finger 01/17/2023   Vertigo 11/30/2022   Hyperlipidemia 04/03/2017   Retinal scar of left eye 06/29/2016   Physical exam 04/01/2016   Diabetes mellitus type II, controlled, with no complications (HCC) 01/27/2016   Hypothyroid 01/27/2016   Depression 01/27/2016   Idiopathic scoliosis 01/27/2016    Past Medical History:  Diagnosis Date   Arthritis    Basal cell carcinoma    Depression    Diabetes mellitus without complication (HCC)    type II   Eczema    Hyperlipidemia    Ischemic colitis (HCC)    Left ACL tear    Osteopenia    Thyroid disease    thyroid removed age 44 due to benign mass   Vertigo     Family History  Problem Relation Age of Onset   Heart disease Mother    Healthy Sister    Diabetes Maternal Aunt    Heart disease Brother    Breast cancer Neg Hx    Past Surgical History:  Procedure Laterality Date   BREAST BIOPSY Left    Benign   BREAST MASS EXCISION Right 1998   BUNIONECTOMY  1997   KNEE ARTHROSCOPY  12/04/2007   REPLACEMENT TOTAL KNEE Left 02/21/2022   REPLACEMENT TOTAL KNEE Right 07/2022   SHOULDER SURGERY Right  08/10/2006   THYROIDECTOMY  1978   due to enlarged goiter   TONSILLECTOMY     TOTAL KNEE ARTHROPLASTY Left 02/21/2022   Procedure: LEFT TOTAL KNEE ARTHROPLASTY;  Surgeon: Joen Laura, MD;  Location: [facility];  Service: Orthopedics;  Laterality: Left;   TUBAL LIGATION     Social History   Social History Narrative   Not on file   Immunization History  Administered Date(s) Administered   Fluad Quad(high Dose 65+) 07/16/2019, 07/13/2020, 07/15/2021   Fluad Trivalent(High Dose 65+) 07/05/2023   Influenza, High Dose Seasonal PF 07/12/2018   Influenza,inj,Quad PF,6+ Mos 08/01/2008, 08/10/2010, 08/31/2012, 08/05/2013, 10/07/2014, 09/16/2015, 10/11/2016, 07/18/2017   Influenza-Unspecified 08/14/2021, 07/29/2022, 07/29/2022   PFIZER Comirnaty(Gray Top)Covid-19 Tri-Sucrose Vaccine 02/23/2021, 02/23/2021   PFIZER(Purple Top)SARS-COV-2 Vaccination 11/21/2019, 12/12/2019, 07/29/2022   PNEUMOCOCCAL CONJUGATE-20 08/05/2022   Pfizer Covid-19 Vaccine Bivalent Booster 30yrs & up 08/03/2021, 07/29/2022   Pneumococcal Conjugate-13 04/01/2016   Pneumococcal Polysaccharide-23 08/31/2012, 04/03/2017   Respiratory Syncytial Virus  Vaccine,Recomb Aduvanted(Arexvy) 08/04/2022   Tdap 10/15/2007   Zoster Recombinant(Shingrix) 08/27/2021, 01/10/2022   Zoster, Live 10/13/2011     Objective: Vital Signs: BP 123/75 (BP Location: Left Arm, Patient Position: Sitting, Cuff Size: Normal)   Pulse 65   Resp 14   Ht 5\' 4"  (1.626 m)   Wt 136 lb (61.7 kg)   LMP 10/31/2004 (Approximate)   BMI 23.34 kg/m    Physical Exam Cardiovascular:     Rate and Rhythm: Normal rate and regular rhythm.  Pulmonary:     Effort: Pulmonary effort is normal.     Breath sounds: Normal breath sounds.  Skin:    General: Skin is warm and dry.  Neurological:     Mental Status: She is alert.  Psychiatric:        Mood and Affect: Mood normal.      Musculoskeletal Exam:  Shoulders full ROM no tenderness or swelling Elbows  full ROM no tenderness or swelling Wrists full ROM no tenderness or swelling Heberdon's nodes both hands and MCPs and worst in DIP joints, well-healed appearing fifth DIP scar Knees full ROM no tenderness or swelling Ankles full ROM no tenderness or swelling Left first MTP bunion with moderate lateral deviation, cocked up toe deformities and other toes, ingrown toenail at right second toe   Investigation: No additional findings.  Imaging: MM 3D SCREENING MAMMOGRAM BILATERAL BREAST  Result Date: 08/31/2023 CLINICAL DATA:  Screening. EXAM: DIGITAL SCREENING BILATERAL MAMMOGRAM WITH TOMOSYNTHESIS AND CAD TECHNIQUE: Bilateral screening digital craniocaudal and mediolateral oblique mammograms were obtained. Bilateral screening digital breast tomosynthesis was performed. The images were evaluated with computer-aided detection. COMPARISON:  Previous exam(s). ACR Breast Density Category c: The breasts are heterogeneously dense, which may obscure small masses. FINDINGS: There are no findings suspicious for malignancy. IMPRESSION: No mammographic evidence of malignancy. A result letter of this screening mammogram will be mailed directly to the patient. RECOMMENDATION: Screening mammogram in one year. (Code:SM-B-01Y) BI-RADS CATEGORY  1: Negative. Electronically Signed   By: Emmaline Kluver M.D.   On: 08/31/2023 12:20    Recent Labs: Lab Results  Component Value Date   WBC 5.7 03/16/2023   HGB 14.3 03/16/2023   PLT 358.0 03/16/2023   NA 136 07/05/2023   K 4.3 07/05/2023   CL 99 07/05/2023   CO2 29 07/05/2023   GLUCOSE 154 (H) 07/05/2023   BUN 19 07/05/2023   CREATININE 0.68 07/05/2023   BILITOT 0.6 03/16/2023   ALKPHOS 70 03/16/2023   AST 18 03/16/2023   ALT 15 03/16/2023   PROT 7.0 03/16/2023   ALBUMIN 4.3 03/16/2023   CALCIUM 9.5 07/05/2023   GFRAA >60 11/14/2019    Speciality Comments: No specialty comments available.  Procedures:  No procedures performed Allergies: Patient has  no known allergies.   Assessment / Plan:     Visit Diagnoses: Generalized osteoarthritis - Plan: celecoxib (CELEBREX) 100 MG capsule  Continue Celebrex 100 mg twice daily.  Discussed the ibuprofen and famotidine combination can be okay to use intermittently for bad days if she feels it is more helpful.  Has continued follow-up with podiatry for toe deformities.  High risk medication use - Celebrex 100 mg twice daily  Recent lab results from October with normal blood count and metabolic panel for monitoring of her methotrexate and dose was reduced by dermatology.  Has been tolerating the Celebrex without any side effect.  Blood pressure is normal.  Discussed continuing on the lower rather than maximal dose for mitigating side effect  risk.  Positive ANA (antinuclear antibody) - already on low-dose methotrexate treatment through dermatology clinic  No new development of peripheral synovitis or other clinical criteria concerning for systemic disease.  Following up with dermatology for atopic dermatitis.  At this time I do not think we need any more close monitoring for this so can follow back annually for OA on NSAIDs or as needed.  Orders: No orders of the defined types were placed in this encounter.  Meds ordered this encounter  Medications   celecoxib (CELEBREX) 100 MG capsule    Sig: Take 1 capsule (100 mg total) by mouth 2 (two) times daily.    Dispense:  180 capsule    Refill:  2     Follow-Up Instructions: Return in about 1 year (around 09/03/2024) for OA on [facility] f/u 30yr.   Fuller Plan, MD  Note - This record has been created using [employer].  Chart creation errors have been sought, but may not always  have been located. Such creation errors do not reflect on  the standard of medical care.

## 2023-08-22 ENCOUNTER — Telehealth: Payer: Self-pay | Admitting: Internal Medicine

## 2023-08-22 NOTE — Telephone Encounter (Signed)
Attempted to contact the patient and left a message to call the office back. Wanted to advised the patient Celebrex was sent in on 08/16/2023 to [facility] in [place].

## 2023-08-22 NOTE — Telephone Encounter (Signed)
Patient contacted the office to request a medication refill.   1. Name of Medication: Celebrex  2. How are you currently taking this medication (dosage and times per day)? One twice a day 100MG    3. What pharmacy would you like for that to be sent to? [facility]- [place]

## 2023-08-23 NOTE — Telephone Encounter (Signed)
Attempted to contact the patient and left a message to call the office back. 

## 2023-08-24 DIAGNOSIS — M25551 Pain in right hip: Secondary | ICD-10-CM | POA: Diagnosis not present

## 2023-08-24 DIAGNOSIS — M62830 Muscle spasm of back: Secondary | ICD-10-CM | POA: Diagnosis not present

## 2023-08-24 DIAGNOSIS — M170 Bilateral primary osteoarthritis of knee: Secondary | ICD-10-CM | POA: Diagnosis not present

## 2023-08-24 DIAGNOSIS — M4120 Other idiopathic scoliosis, site unspecified: Secondary | ICD-10-CM | POA: Diagnosis not present

## 2023-08-29 ENCOUNTER — Ambulatory Visit
Admission: RE | Admit: 2023-08-29 | Discharge: 2023-08-29 | Disposition: A | Discharge status: Home / Self Care | Payer: Medicare HMO | Source: Ambulatory Visit | Attending: Family Medicine | Admitting: Family Medicine

## 2023-08-29 ENCOUNTER — Encounter: Payer: Self-pay | Admitting: Family Medicine

## 2023-08-29 DIAGNOSIS — Z1231 Encounter for screening mammogram for malignant neoplasm of breast: Secondary | ICD-10-CM | POA: Diagnosis not present

## 2023-09-04 ENCOUNTER — Encounter: Payer: Self-pay | Admitting: Internal Medicine

## 2023-09-04 ENCOUNTER — Ambulatory Visit: Payer: Medicare HMO | Attending: Internal Medicine | Admitting: Internal Medicine

## 2023-09-04 VITALS — BP 123/75 | HR 65 | Resp 14 | Ht 64.0 in | Wt 136.0 lb

## 2023-09-04 DIAGNOSIS — Z79899 Other long term (current) drug therapy: Secondary | ICD-10-CM | POA: Diagnosis not present

## 2023-09-04 DIAGNOSIS — M159 Polyosteoarthritis, unspecified: Secondary | ICD-10-CM | POA: Diagnosis not present

## 2023-09-04 DIAGNOSIS — R768 Other specified abnormal immunological findings in serum: Secondary | ICD-10-CM

## 2023-09-04 MED ORDER — CELECOXIB 100 MG PO CAPS
100.0000 mg | ORAL_CAPSULE | Freq: Two times a day (BID) | ORAL | 2 refills | Status: DC
Start: 2023-11-14 — End: 2024-01-15

## 2023-09-05 ENCOUNTER — Encounter: Payer: Self-pay | Admitting: Podiatry

## 2023-09-05 ENCOUNTER — Ambulatory Visit: Payer: Medicare HMO | Admitting: Podiatry

## 2023-09-05 DIAGNOSIS — L03031 Cellulitis of right toe: Secondary | ICD-10-CM

## 2023-09-05 DIAGNOSIS — L600 Ingrowing nail: Secondary | ICD-10-CM | POA: Diagnosis not present

## 2023-09-05 MED ORDER — DOXYCYCLINE HYCLATE 100 MG PO CAPS
100.0000 mg | ORAL_CAPSULE | Freq: Two times a day (BID) | ORAL | 0 refills | Status: AC
Start: 2023-09-05 — End: 2023-09-12

## 2023-09-05 NOTE — Progress Notes (Unsigned)
Subjective:  Patient ID: Ashley Shaffer, female    DOB: 1951-07-15,  MRN: [id number]  Ashley Shaffer presents to clinic today for:  Chief Complaint  Patient presents with   Ingrown Toenail    PATIENT STATES THAT SHE BELIEVES THAT SHE HAS A IN GROWN TOE NAIL ON HER RF 3RD TOE , PATIENT STATES IT HAS BEEN ABOUT A WEEK AND SHE HAS TIRED SOAKING IT AND IT DIDN'T WORK PATIENT STATES SHE IS NOT IN ANY PAIN    Patient presents with her spouse today, for c/o painful ingrowing toenail on right 3rd toenail lateral border.  Denies drainage.  She has tried cutting it back and soaking it, but it isn't getting any better.   PCP is Sheliah Hatch, MD.  No Known Allergies  Review of Systems: Negative except as noted in the HPI.  Objective:  Ashley Shaffer is a pleasant 72 y.o. female in NAD. AAO x 3.  Vascular Examination: Capillary refill time is 3-5 seconds to toes bilateral. Palpable pedal pulses b/l LE. Digital hair present b/l. No pedal edema b/l. Skin temperature gradient WNL b/l. No varicosities b/l. No cyanosis or clubbing noted b/l.   Dermatological Examination: There is incurvation of the right 3rd toenail lateral border.  There is pain on palpation of the affected nail border.  Mild localized erythema and edema noted periungally.  Neurological Examination: Protective sensation intact with Semmes-Weinstein 10 gram monofilament b/l LE. Vibratory sensation intact b/l LE.     Latest Ref Rng & Units 07/05/2023    8:55 AM 03/16/2023    8:46 AM 11/30/2022    8:52 AM  Hemoglobin A1C  Hemoglobin-A1c 4.6 - 6.5 % 7.1  6.9  8.6     Assessment/Plan: 1. Ingrown toenail   2. Paronychia of toe of right foot     Meds ordered this encounter  Medications   doxycycline (VIBRAMYCIN) 100 MG capsule    Sig: Take 1 capsule (100 mg total) by mouth 2 (two) times daily for 7 days.    Dispense:  14 capsule    Refill:  0   Discussed patient's condition today.  After obtaining  patient consent, the right 3rd toe was anesthetized with a 50:50 mixture of 1% lidocaine plain and 0.5% bupivacaine plain for a total of 3cc's administered.  Upon confirmation of anesthesia, a freer elevator was utilized to free the lateral toenail border from the nail bed.  The nail border was then avulsed proximal to the eponychium and removed in toto.  The area was inspected for any remaining spicules.  A chemical matrixectomy was performed with NaOH and neutralized with acetic acid solution.  Antibiotic ointment and a DSD were applied, followed by a Coban dressing.  Patient tolerated the anesthetic and procedure well and will f/u in 2-3 weeks for recheck.  Patient given post-procedure instructions for daily 15-minute Epsom salt soaks, antibiotic ointment and daily use of Bandaids until toe starts to dry / form eschar.   Rx doxycycline sent to her pharmacy for the localized infection.  Return in about 2 weeks (around 09/19/2023) for PNA recheck.   Clerance Lav, DPM, FACFAS [facility]     [address], [place] [address]  Office [phone]  Fax [phone]

## 2023-09-05 NOTE — Patient Instructions (Signed)

## 2023-09-06 ENCOUNTER — Telehealth: Payer: Self-pay

## 2023-09-06 NOTE — Telephone Encounter (Signed)
Sent to Dr.TABORI

## 2023-09-08 NOTE — Telephone Encounter (Signed)
Reviewed.  Pt has declined in the past.  No changes at this time

## 2023-09-18 ENCOUNTER — Encounter: Payer: Self-pay | Admitting: Podiatry

## 2023-09-18 ENCOUNTER — Ambulatory Visit: Payer: Medicare HMO | Admitting: Podiatry

## 2023-09-18 DIAGNOSIS — L03031 Cellulitis of right toe: Secondary | ICD-10-CM

## 2023-09-18 NOTE — Patient Instructions (Signed)

## 2023-09-18 NOTE — Progress Notes (Signed)
Subjective:   Patient ID: Ashley Shaffer, female   DOB: 73 y.o.   MRN: [id number]   HPI Patient states the third toe seems to be healed well but the second toe lateral border has turned red and that also is becoming tender   ROS      Objective:  Physical Exam  Neurovascular status intact with the patient found to have well-healed surgical site third digit right crusted tissue second digit right shows localized redness around the area and pain on the border with slight incurvation     Assessment:  Nail disease second right with incurvation     Plan:  H&P reviewed recommended a paronychia removal of this patient wants this done I explained procedure risk and I infiltrated the right second toe 60 mg like Marcaine mixture sterile prep done using sterile instrumentation removed the lateral border flushed the area applied sterile dressing begin soaks and should heal uneventfully

## 2023-09-24 DIAGNOSIS — M62830 Muscle spasm of back: Secondary | ICD-10-CM | POA: Diagnosis not present

## 2023-09-24 DIAGNOSIS — M25551 Pain in right hip: Secondary | ICD-10-CM | POA: Diagnosis not present

## 2023-09-25 ENCOUNTER — Ambulatory Visit: Payer: Medicare HMO | Admitting: Podiatry

## 2023-10-03 ENCOUNTER — Ambulatory Visit: Payer: Medicare HMO | Admitting: Podiatry

## 2023-10-05 DIAGNOSIS — H5203 Hypermetropia, bilateral: Secondary | ICD-10-CM | POA: Diagnosis not present

## 2023-10-05 DIAGNOSIS — H2513 Age-related nuclear cataract, bilateral: Secondary | ICD-10-CM | POA: Diagnosis not present

## 2023-10-05 DIAGNOSIS — E119 Type 2 diabetes mellitus without complications: Secondary | ICD-10-CM | POA: Diagnosis not present

## 2023-10-05 DIAGNOSIS — Z0100 Encounter for examination of eyes and vision without abnormal findings: Secondary | ICD-10-CM | POA: Diagnosis not present

## 2023-10-05 LAB — HM DIABETES EYE EXAM

## 2023-10-08 ENCOUNTER — Other Ambulatory Visit: Payer: Self-pay | Admitting: Family Medicine

## 2023-10-12 ENCOUNTER — Ambulatory Visit: Payer: Medicare HMO | Admitting: Family Medicine

## 2023-10-12 ENCOUNTER — Encounter: Payer: Self-pay | Admitting: Family Medicine

## 2023-10-12 VITALS — BP 104/60 | HR 98 | Temp 97.8°F | Ht 64.0 in | Wt 136.1 lb

## 2023-10-12 DIAGNOSIS — E119 Type 2 diabetes mellitus without complications: Secondary | ICD-10-CM | POA: Diagnosis not present

## 2023-10-12 DIAGNOSIS — Z0000 Encounter for general adult medical examination without abnormal findings: Secondary | ICD-10-CM

## 2023-10-12 LAB — CBC WITH DIFFERENTIAL/PLATELET
Basophils Absolute: 0 10*3/uL (ref 0.0–0.1)
Basophils Relative: 0.8 % (ref 0.0–3.0)
Eosinophils Absolute: 0.4 10*3/uL (ref 0.0–0.7)
Eosinophils Relative: 6.4 % — ABNORMAL HIGH (ref 0.0–5.0)
HCT: 42.4 % (ref 36.0–46.0)
Hemoglobin: 14 g/dL (ref 12.0–15.0)
Lymphocytes Relative: 22.4 % (ref 12.0–46.0)
Lymphs Abs: 1.4 10*3/uL (ref 0.7–4.0)
MCHC: 33.1 g/dL (ref 30.0–36.0)
MCV: 94.6 fL (ref 78.0–100.0)
Monocytes Absolute: 0.6 10*3/uL (ref 0.1–1.0)
Monocytes Relative: 10.3 % (ref 3.0–12.0)
Neutro Abs: 3.7 10*3/uL (ref 1.4–7.7)
Neutrophils Relative %: 60.1 % (ref 43.0–77.0)
Platelets: 340 10*3/uL (ref 150.0–400.0)
RBC: 4.48 Mil/uL (ref 3.87–5.11)
RDW: 13.7 % (ref 11.5–15.5)
WBC: 6.2 10*3/uL (ref 4.0–10.5)

## 2023-10-12 LAB — HEPATIC FUNCTION PANEL
ALT: 16 U/L (ref 0–35)
AST: 18 U/L (ref 0–37)
Albumin: 4.4 g/dL (ref 3.5–5.2)
Alkaline Phosphatase: 65 U/L (ref 39–117)
Bilirubin, Direct: 0.1 mg/dL (ref 0.0–0.3)
Total Bilirubin: 0.5 mg/dL (ref 0.2–1.2)
Total Protein: 6.7 g/dL (ref 6.0–8.3)

## 2023-10-12 LAB — LIPID PANEL
Cholesterol: 207 mg/dL — ABNORMAL HIGH (ref 0–200)
HDL: 74.1 mg/dL (ref >39.00)
LDL Cholesterol: 115 mg/dL — ABNORMAL HIGH (ref 0–99)
NonHDL: 132.56
Total CHOL/HDL Ratio: 3
Triglycerides: 87 mg/dL (ref 0.0–149.0)
VLDL: 17.4 mg/dL (ref 0.0–40.0)

## 2023-10-12 LAB — BASIC METABOLIC PANEL
BUN: 18 mg/dL (ref 6–23)
CO2: 29 meq/L (ref 19–32)
Calcium: 9.4 mg/dL (ref 8.4–10.5)
Chloride: 98 meq/L (ref 96–112)
Creatinine, Ser: 0.74 mg/dL (ref 0.40–1.20)
GFR: 80.63 mL/min (ref >60.00)
Glucose, Bld: 200 mg/dL — ABNORMAL HIGH (ref 70–99)
Potassium: 4.4 meq/L (ref 3.5–5.1)
Sodium: 135 meq/L (ref 135–145)

## 2023-10-12 LAB — MICROALBUMIN / CREATININE URINE RATIO
Creatinine,U: 23.1 mg/dL
Microalb Creat Ratio: 3 mg/g (ref 0.0–30.0)
Microalb, Ur: <0.7 mg/dL (ref 0.0–1.9)

## 2023-10-12 LAB — TSH: TSH: 4.22 u[IU]/mL (ref 0.35–5.50)

## 2023-10-12 LAB — HEMOGLOBIN A1C: Hgb A1c MFr Bld: 6.8 % — ABNORMAL HIGH (ref 4.6–6.5)

## 2023-10-12 MED ORDER — SAXAGLIPTIN HCL 2.5 MG PO TABS: ORAL_TABLET | ORAL | 3 refills | Status: DC

## 2023-10-12 NOTE — Assessment & Plan Note (Signed)
Pt's PE WNL and unchanged from previous.  UTD on mammo, cologuard, immunizations.  Check labs.  Anticipatory guidance provided.

## 2023-10-12 NOTE — Progress Notes (Signed)
   Subjective:    Patient ID: Ashley Shaffer, female    DOB: 1950-12-20, 72 y.o.   MRN: [id number]  HPI CPE- UTD on eye exam, mammo, cologuard, PNA, flu.  Due for foot exam, microalbumin  Patient Care Team    Relationship Specialty Notifications Start End  Sheliah Hatch, MD PCP - General Family Medicine  01/22/16   Patton Salles, MD Consulting Physician Obstetrics and Gynecology  04/24/20   Kerri Perches, MD Attending Physician Optometry  10/12/23      Health Maintenance  Topic Date Due   Medicare Annual Wellness (AWV)  04/03/2018   OPHTHALMOLOGY EXAM  10/04/2023   Diabetic kidney evaluation - Urine ACR  12/01/2023   HEMOGLOBIN A1C  01/02/2024   Diabetic kidney evaluation - eGFR measurement  07/04/2024   FOOT EXAM  10/11/2024   MAMMOGRAM  08/28/2025   Fecal DNA (Cologuard)  03/27/2026   Pneumonia Vaccine 5+ Years old  Completed   INFLUENZA VACCINE  Completed   DEXA SCAN  Completed   Hepatitis C Screening  Completed   Zoster Vaccines- Shingrix  Completed   HPV VACCINES  Aged Out   DTaP/Tdap/Td  Discontinued   Colonoscopy  Discontinued   COVID-19 Vaccine  Discontinued      Review of Systems Patient reports no vision/ hearing changes, adenopathy,fever, weight change,  persistant/recurrent hoarseness , swallowing issues, chest pain, palpitations, edema, persistant/recurrent cough, hemoptysis, dyspnea (rest/exertional/paroxysmal nocturnal), gastrointestinal bleeding (melena, rectal bleeding), abdominal pain, significant heartburn, bowel changes, GU symptoms (dysuria, hematuria, incontinence), Gyn symptoms (abnormal  bleeding, pain),  syncope, focal weakness, memory loss, numbness & tingling, skin/hair/nail changes, abnormal bruising or bleeding, anxiety, or depression.     Objective:   Physical Exam General Appearance:    Alert, cooperative, no distress, appears stated age  Head:    Normocephalic, without obvious abnormality, atraumatic  Eyes:    PERRL,  conjunctiva/corneas clear, EOM's intact both eyes  Ears:    Normal TM's and external ear canals, both ears  Nose:   Nares normal, septum midline, mucosa normal, no drainage    or sinus tenderness  Throat:   Lips, mucosa, and tongue normal; teeth and gums normal  Neck:   Supple, symmetrical, trachea midline, no adenopathy;    Thyroid: no enlargement/tenderness/nodules  Back:     Symmetric, no curvature, ROM normal, no CVA tenderness  Lungs:     Clear to auscultation bilaterally, respirations unlabored  Chest Wall:    No tenderness or deformity   Heart:    Regular rate and rhythm, S1 and S2 normal, no murmur, rub   or gallop  Breast Exam:    Deferred to mammo  Abdomen:     Soft, non-tender, bowel sounds active all four quadrants,    no masses, no organomegaly  Genitalia:    Deferred  Rectal:    Extremities:   Extremities normal, atraumatic, no cyanosis or edema  Pulses:   2+ and symmetric all extremities  Skin:   Skin color, texture, turgor normal, no rashes or lesions  Lymph nodes:   Cervical, supraclavicular, and axillary nodes normal  Neurologic:   CNII-XII intact          Assessment & Plan:

## 2023-10-12 NOTE — Patient Instructions (Signed)
Follow up in 3 months to recheck sugar We'll notify you of your lab results and make any changes if needed Keep up the good work on healthy diet and regular exercise- you look great!! Call with any questions or concerns Stay Safe!  Stay Healthy! Happy Holidays!!!

## 2023-10-13 ENCOUNTER — Telehealth: Payer: Self-pay

## 2023-10-13 DIAGNOSIS — E785 Hyperlipidemia, unspecified: Secondary | ICD-10-CM

## 2023-10-13 MED ORDER — ROSUVASTATIN CALCIUM 5 MG PO TABS: 5.0000 mg | ORAL_TABLET | Freq: Every evening | ORAL | 3 refills | Status: DC

## 2023-10-13 NOTE — Telephone Encounter (Signed)
-----   Message from Neena Rhymes sent at 10/13/2023  7:44 AM EST ----- Labs look good!  A1C is awesome at 6.8%  LDL (bad cholesterol) is elevated at 115.  The goal for someone who has diabetes is <70.  Ideally we should start a low dose cholesterol medicine like Crestor 5mg  nightly (#30, 3 refills) if you're ok with that

## 2023-10-13 NOTE — Telephone Encounter (Signed)
Patient is aware of labs, she is hesitant about the crestor but states she will give it a try. I have sent it in to [facility] in [place] as requested.

## 2023-10-24 DIAGNOSIS — M62830 Muscle spasm of back: Secondary | ICD-10-CM | POA: Diagnosis not present

## 2023-10-24 DIAGNOSIS — M25551 Pain in right hip: Secondary | ICD-10-CM | POA: Diagnosis not present

## 2023-10-27 NOTE — Telephone Encounter (Signed)
error 

## 2023-11-14 ENCOUNTER — Encounter: Payer: Self-pay | Admitting: Family Medicine

## 2023-11-14 ENCOUNTER — Other Ambulatory Visit: Payer: Self-pay

## 2023-11-14 DIAGNOSIS — F339 Major depressive disorder, recurrent, unspecified: Secondary | ICD-10-CM

## 2023-11-14 MED ORDER — BUPROPION HCL ER (SR) 100 MG PO TB12: ORAL_TABLET | ORAL | 1 refills | Status: DC

## 2023-11-16 DIAGNOSIS — S0100XA Unspecified open wound of scalp, initial encounter: Secondary | ICD-10-CM | POA: Diagnosis not present

## 2023-11-23 DIAGNOSIS — Z79899 Other long term (current) drug therapy: Secondary | ICD-10-CM | POA: Diagnosis not present

## 2023-11-24 DIAGNOSIS — M25551 Pain in right hip: Secondary | ICD-10-CM | POA: Diagnosis not present

## 2023-11-24 DIAGNOSIS — M62830 Muscle spasm of back: Secondary | ICD-10-CM | POA: Diagnosis not present

## 2023-11-27 ENCOUNTER — Encounter: Payer: Self-pay | Admitting: Family Medicine

## 2023-11-27 DIAGNOSIS — L308 Other specified dermatitis: Secondary | ICD-10-CM | POA: Diagnosis not present

## 2023-11-28 ENCOUNTER — Encounter: Payer: Self-pay | Admitting: Family Medicine

## 2023-11-28 DIAGNOSIS — H259 Unspecified age-related cataract: Secondary | ICD-10-CM

## 2023-11-28 NOTE — Telephone Encounter (Signed)
Patient was told she needs a referral to [facility] and she is requesting to see Dr Lujean Rave.   I tried to pend but could not find an appropriate order.

## 2023-11-30 ENCOUNTER — Other Ambulatory Visit: Payer: Self-pay | Admitting: Family Medicine

## 2023-11-30 MED ORDER — EMPAGLIFLOZIN 10 MG PO TABS: ORAL_TABLET | ORAL | 1 refills | Status: DC

## 2023-11-30 NOTE — Telephone Encounter (Signed)
Copied from CRM [id number]. Topic: Clinical - Medication Refill >> Nov 30, 2023  8:51 AM Leavy Cella D wrote: Most Recent Primary Care Visit:  Provider: Sheliah Hatch  Department: [facility]  Visit Type: PHYSICAL  Date: 10/12/2023  Medication: empagliflozin (JARDIANCE) 10 MG TABS tablet  Has the patient contacted their pharmacy? No (Agent: If no, request that the patient contact the pharmacy for the refill. If patient does not wish to contact the pharmacy document the reason why and proceed with request.) (Agent: If yes, when and what did the pharmacy advise?)  Is this the correct pharmacy for this prescription? Yes If no, delete pharmacy and type the correct one.  This is the patient's preferred pharmacy:    [facility] - [place], [place] - [address] [place] [address] Phone: [phone] Fax: [phone]     Has the prescription been filled recently? No  Is the patient out of the medication? Yes  Has the patient been seen for an appointment in the last year OR does the patient have an upcoming appointment? Yes  Can we respond through MyChart? No  Agent: Please be advised that Rx refills may take up to 3 business days. We ask that you follow-up with your pharmacy.

## 2023-12-01 ENCOUNTER — Telehealth: Payer: Self-pay | Admitting: Family Medicine

## 2023-12-01 MED ORDER — EMPAGLIFLOZIN 10 MG PO TABS: ORAL_TABLET | ORAL | 1 refills | Status: DC

## 2023-12-01 NOTE — Telephone Encounter (Signed)
Patient has been informed this was sent and is grateful

## 2023-12-01 NOTE — Telephone Encounter (Signed)
 Copied from CRM [id number]. Topic: Clinical - Medication Refill >> Nov 30, 2023  8:51 AM Leavy Cella D wrote: Most Recent Primary Care Visit:  Provider: Sheliah Hatch  Department: [facility]  Visit Type: PHYSICAL  Date: 10/12/2023  Medication: empagliflozin (JARDIANCE) 10 MG TABS tablet  Has the patient contacted their pharmacy? No (Agent: If no, request that the patient contact the pharmacy for the refill. If patient does not wish to contact the pharmacy document the reason why and proceed with request.) (Agent: If yes, when and what did the pharmacy advise?)  Is this the correct pharmacy for this prescription? Yes If no, delete pharmacy and type the correct one.  This is the patient's preferred pharmacy:    [facility] - [place], [place] - [address] [place] [address] Phone: [phone] Fax: [phone]     Has the prescription been filled recently? No  Is the patient out of the medication? Yes  Has the patient been seen for an appointment in the last year OR does the patient have an upcoming appointment? Yes  Can we respond through MyChart? No  Agent: Please be advised that Rx refills may take up to 3 business days. We ask that you follow-up with your pharmacy.

## 2023-12-01 NOTE — Telephone Encounter (Signed)
Patient is requesting the medication be sent to [facility] delivery pharmacy

## 2023-12-01 NOTE — Addendum Note (Signed)
Addended by: Eldred Manges on: 12/01/2023 02:04 PM   Modules accepted: Orders

## 2023-12-25 DIAGNOSIS — M25551 Pain in right hip: Secondary | ICD-10-CM | POA: Diagnosis not present

## 2023-12-25 DIAGNOSIS — M62830 Muscle spasm of back: Secondary | ICD-10-CM | POA: Diagnosis not present

## 2024-01-12 ENCOUNTER — Ambulatory Visit: Payer: Medicare HMO | Admitting: Family Medicine

## 2024-01-15 ENCOUNTER — Encounter: Payer: Self-pay | Admitting: Family Medicine

## 2024-01-15 ENCOUNTER — Ambulatory Visit: Payer: Medicare HMO | Admitting: Family Medicine

## 2024-01-15 VITALS — BP 124/64 | HR 68 | Temp 97.8°F | Ht 64.0 in

## 2024-01-15 DIAGNOSIS — E785 Hyperlipidemia, unspecified: Secondary | ICD-10-CM | POA: Diagnosis not present

## 2024-01-15 DIAGNOSIS — M62830 Muscle spasm of back: Secondary | ICD-10-CM

## 2024-01-15 DIAGNOSIS — Z7984 Long term (current) use of oral hypoglycemic drugs: Secondary | ICD-10-CM | POA: Diagnosis not present

## 2024-01-15 DIAGNOSIS — E119 Type 2 diabetes mellitus without complications: Secondary | ICD-10-CM

## 2024-01-15 LAB — BASIC METABOLIC PANEL
BUN: 18 mg/dL (ref 6–23)
CO2: 27 meq/L (ref 19–32)
Calcium: 9.8 mg/dL (ref 8.4–10.5)
Chloride: 101 meq/L (ref 96–112)
Creatinine, Ser: 0.7 mg/dL (ref 0.40–1.20)
GFR: 86.04 mL/min (ref >60.00)
Glucose, Bld: 138 mg/dL — ABNORMAL HIGH (ref 70–99)
Potassium: 4 meq/L (ref 3.5–5.1)
Sodium: 135 meq/L (ref 135–145)

## 2024-01-15 LAB — HEMOGLOBIN A1C: Hgb A1c MFr Bld: 6.8 % — ABNORMAL HIGH (ref 4.6–6.5)

## 2024-01-15 MED ORDER — ACCU-CHEK SOFTCLIX LANCETS MISC
3 refills | Status: AC
Start: 2024-01-15 — End: ?

## 2024-01-15 NOTE — Telephone Encounter (Signed)
 Lab results have been discussed.   Verbalized understanding? Yes  Are there any questions? No

## 2024-01-15 NOTE — Patient Instructions (Signed)
 Follow up in 3-4 months to recheck sugar and cholesterol We'll notify you of your lab results and make any changes if needed START taking Methocarbamol nightly Call with any questions or concerns Stay Safe!  Stay Healthy! Happy Belated Birthday!!

## 2024-01-15 NOTE — Telephone Encounter (Signed)
-----   Message from Neena Rhymes sent at 01/15/2024  2:32 PM EDT ----- Labs look good!  No changes at this time

## 2024-01-15 NOTE — Progress Notes (Unsigned)
   Subjective:    Patient ID: Ashley Shaffer, female    DOB: 04-18-1951, 73 y.o.   MRN: [id number]  HPI DM- chronic problem.  On Jardiance 10mg  daily, Metformin XR 1000mg  daily, Onglyza 5mg  daily.  UTD on eye exam, foot exam, microalbumin.  Exercising regularly at [employer].  No CP, SOB, HA's, visual changes, abd pain.  No symptomatic lows.  Back spasm- pt reports she is waking w/ back spasms regularly.  Took Methocarbamol last night and woke spasm free.  She has remaining Methocarbamol from knee surgeries.    Hyperlipidemia- started Crestor 5mg    Review of Systems For ROS see HPI     Objective:   Physical Exam Vitals reviewed.  Constitutional:      General: She is not in acute distress.    Appearance: Normal appearance. She is well-developed. She is not ill-appearing.  HENT:     Head: Normocephalic and atraumatic.  Eyes:     Conjunctiva/sclera: Conjunctivae normal.     Pupils: Pupils are equal, round, and reactive to light.  Neck:     Thyroid: No thyromegaly.  Cardiovascular:     Rate and Rhythm: Normal rate and regular rhythm.     Pulses: Normal pulses.     Heart sounds: Normal heart sounds. No murmur heard. Pulmonary:     Effort: Pulmonary effort is normal. No respiratory distress.     Breath sounds: Normal breath sounds.  Abdominal:     General: There is no distension.     Palpations: Abdomen is soft.     Tenderness: There is no abdominal tenderness.  Musculoskeletal:     Cervical back: Normal range of motion and neck supple.     Right lower leg: No edema.     Left lower leg: No edema.  Lymphadenopathy:     Cervical: No cervical adenopathy.  Skin:    General: Skin is warm and dry.  Neurological:     General: No focal deficit present.     Mental Status: She is alert and oriented to person, place, and time.  Psychiatric:        Mood and Affect: Mood normal.        Behavior: Behavior normal.        Thought Content: Thought content normal.            Assessment & Plan:

## 2024-01-16 ENCOUNTER — Other Ambulatory Visit: Payer: Self-pay

## 2024-01-16 ENCOUNTER — Ambulatory Visit (INDEPENDENT_AMBULATORY_CARE_PROVIDER_SITE_OTHER)

## 2024-01-16 DIAGNOSIS — E785 Hyperlipidemia, unspecified: Secondary | ICD-10-CM

## 2024-01-16 LAB — LIPID PANEL
Cholesterol: 136 mg/dL (ref 0–200)
HDL: 68 mg/dL (ref >39.00)
LDL Cholesterol: 53 mg/dL (ref 0–99)
NonHDL: 67.69
Total CHOL/HDL Ratio: 2
Triglycerides: 73 mg/dL (ref 0.0–149.0)
VLDL: 14.6 mg/dL (ref 0.0–40.0)

## 2024-01-16 NOTE — Assessment & Plan Note (Signed)
 Chronic problem but she just recently started statin.  Check labs.  Adjust meds prn.

## 2024-01-16 NOTE — Assessment & Plan Note (Signed)
 Chronic problem.  Has been doing well on Jardiance, Metformin, and Onglyza.  UTD on eye exam, foot exam, microalbumin.  Currently asymptomatic.  Check labs.  Adjust meds prn

## 2024-01-17 ENCOUNTER — Encounter: Payer: Self-pay | Admitting: Family Medicine

## 2024-01-17 DIAGNOSIS — H25013 Cortical age-related cataract, bilateral: Secondary | ICD-10-CM | POA: Diagnosis not present

## 2024-01-17 DIAGNOSIS — H2513 Age-related nuclear cataract, bilateral: Secondary | ICD-10-CM | POA: Diagnosis not present

## 2024-01-17 DIAGNOSIS — H25043 Posterior subcapsular polar age-related cataract, bilateral: Secondary | ICD-10-CM | POA: Diagnosis not present

## 2024-01-17 DIAGNOSIS — E119 Type 2 diabetes mellitus without complications: Secondary | ICD-10-CM | POA: Diagnosis not present

## 2024-01-24 DIAGNOSIS — M62830 Muscle spasm of back: Secondary | ICD-10-CM | POA: Diagnosis not present

## 2024-01-24 DIAGNOSIS — M25551 Pain in right hip: Secondary | ICD-10-CM | POA: Diagnosis not present

## 2024-01-26 ENCOUNTER — Encounter: Payer: Self-pay | Admitting: Family Medicine

## 2024-01-26 MED ORDER — ACCU-CHEK AVIVA PLUS VI STRP: ORAL_STRIP | 3 refills | Status: AC

## 2024-01-29 ENCOUNTER — Encounter: Payer: Self-pay | Admitting: Family Medicine

## 2024-01-29 NOTE — Telephone Encounter (Signed)
 Test strips are on a long back ordered according to patient, Can we order a new blood sugar test kit for patient?

## 2024-01-30 ENCOUNTER — Encounter: Payer: Self-pay | Admitting: Family Medicine

## 2024-01-30 MED ORDER — SAXAGLIPTIN HCL 2.5 MG PO TABS: ORAL_TABLET | ORAL | 3 refills | Status: DC

## 2024-02-07 ENCOUNTER — Other Ambulatory Visit: Payer: Self-pay | Admitting: Family Medicine

## 2024-02-07 DIAGNOSIS — E785 Hyperlipidemia, unspecified: Secondary | ICD-10-CM

## 2024-02-24 DIAGNOSIS — M62830 Muscle spasm of back: Secondary | ICD-10-CM | POA: Diagnosis not present

## 2024-02-24 DIAGNOSIS — M25551 Pain in right hip: Secondary | ICD-10-CM | POA: Diagnosis not present

## 2024-03-11 ENCOUNTER — Other Ambulatory Visit: Payer: Self-pay

## 2024-03-11 MED ORDER — EMPAGLIFLOZIN 10 MG PO TABS: ORAL_TABLET | ORAL | 1 refills | Status: DC

## 2024-04-01 ENCOUNTER — Other Ambulatory Visit (HOSPITAL_COMMUNITY): Payer: Self-pay | Admitting: Family Medicine

## 2024-04-01 DIAGNOSIS — Z79899 Other long term (current) drug therapy: Secondary | ICD-10-CM | POA: Diagnosis not present

## 2024-04-01 DIAGNOSIS — L308 Other specified dermatitis: Secondary | ICD-10-CM | POA: Diagnosis not present

## 2024-04-02 ENCOUNTER — Ambulatory Visit: Admitting: Podiatry

## 2024-04-05 ENCOUNTER — Ambulatory Visit (INDEPENDENT_AMBULATORY_CARE_PROVIDER_SITE_OTHER)

## 2024-04-05 ENCOUNTER — Ambulatory Visit: Admitting: Podiatry

## 2024-04-05 DIAGNOSIS — R252 Cramp and spasm: Secondary | ICD-10-CM | POA: Diagnosis not present

## 2024-04-05 DIAGNOSIS — M21612 Bunion of left foot: Secondary | ICD-10-CM

## 2024-04-05 DIAGNOSIS — L97522 Non-pressure chronic ulcer of other part of left foot with fat layer exposed: Secondary | ICD-10-CM

## 2024-04-05 DIAGNOSIS — L97521 Non-pressure chronic ulcer of other part of left foot limited to breakdown of skin: Secondary | ICD-10-CM

## 2024-04-05 MED ORDER — MUPIROCIN 2 % EX OINT: 1.0000 | TOPICAL_OINTMENT | Freq: Two times a day (BID) | CUTANEOUS | 2 refills | Status: AC

## 2024-04-05 MED ORDER — CEPHALEXIN 500 MG PO CAPS: 500.0000 mg | ORAL_CAPSULE | Freq: Three times a day (TID) | ORAL | 0 refills | Status: DC

## 2024-04-05 NOTE — Progress Notes (Signed)
  Subjective:  Patient ID: Ashley Shaffer, female    DOB: 1951/06/26,  MRN: [id number]  Chief Complaint  Patient presents with   Foot Ulcer    RM#14 Left foot ulcer causing pain.    Discussed the use of AI scribe software for clinical note transcription with the patient, who gave verbal consent to proceed.  History of Present Illness Ashley Shaffer is a 73 year old female with diabetes who presents with a foot ulcer.  The foot ulcer developed approximately ten days ago after a pedicure incident where a bunion was sandpapered, causing immediate pain. She manages the wound with a Band-Aid and continues to wear shoes and boots for horse riding. There is no drainage or fluid from the ulcer.  Her diabetes is well-controlled with a recent A1c of 6.8. She is concerned about the ulcer due to her diabetic status.  For pain, she uses Tylenol  and over-the-counter 4% lidocaine cream, which improves her symptoms. She experienced a severe leg cramp this morning, described as 'excruciating'. This is new.   She plans to temporarily pause horse riding to aid in the healing of her foot ulcer.      Objective:    Physical Exam General: AAO x3, NAD  Dermatological: On the left foot along the area of the bunion there is a superficial wound present.  There is localized edema and erythema.  The wound itself measures about 0.4 x 0.2 cm and is superficial without any probing.  There is no drainage or pus.  There is no fluctuation or crepitation.  There is no malodor.  No other open lesions are identified.  Vascular: Dorsalis Pedis artery and Posterior Tibial artery pedal pulses are 2/4 bilateral with immedate capillary fill time.  There is no pain with calf compression, swelling, warmth, erythema.  No evidence of DVT today.  Neruologic: Grossly intact via light touch bilateral.   Musculoskeletal: Significant bunion is present.      Results LABS A1c: 6.8% (01/15/2024) Electrolytes: Normal  (12/2023)  RADIOLOGY Foot X-ray: Normal   Assessment:   1. Ulcer of left foot, limited to breakdown of skin (HCC)   2. Bunion, left foot      Plan:  Patient was evaluated and treated and all questions answered.  Assessment and Plan Assessment & Plan Foot ulcer Superficial ulcer on lateral foot, likely post-pedicure trauma. Erythematous, inflamed, no exudate. Intact circulation. Diabetes increases infection risk. - Prescribed oral Keflex  (cefalexin). - Prescribed topical mupirocin  ointment. - Advised wearing open-toe shoes. - Provided bunion pad for closed-end shoes. - Instructed to leave ulcer open at night, apply mupirocin  during day. - Scheduled follow-up in two weeks.  Type 2 diabetes mellitus Type 2 diabetes with A1c of 6.8, well-controlled.  Muscle cramp Acute severe leg cramp this morning. Calf is supple and there is no warmth or pain at this time. No edema.  - Recommended topical cream such as [employer]. - Advised adequate hydration.   Charity Conch DPM      Return in about 2 weeks (around 04/19/2024).

## 2024-04-05 NOTE — Patient Instructions (Signed)
 Wash with soap and water , dry well. Use a small amount of antibiotic ointment and a bandage daily.  Monitor for any signs/symptoms of infection. Call the office immediately if any occur or go directly to the emergency room. Call with any questions/concerns.

## 2024-04-08 ENCOUNTER — Ambulatory Visit: Admitting: Family Medicine

## 2024-04-08 DIAGNOSIS — H02423 Myogenic ptosis of bilateral eyelids: Secondary | ICD-10-CM | POA: Diagnosis not present

## 2024-04-08 DIAGNOSIS — H02412 Mechanical ptosis of left eyelid: Secondary | ICD-10-CM | POA: Diagnosis not present

## 2024-04-08 DIAGNOSIS — H02422 Myogenic ptosis of left eyelid: Secondary | ICD-10-CM | POA: Diagnosis not present

## 2024-04-08 DIAGNOSIS — H02411 Mechanical ptosis of right eyelid: Secondary | ICD-10-CM | POA: Diagnosis not present

## 2024-04-08 DIAGNOSIS — H02834 Dermatochalasis of left upper eyelid: Secondary | ICD-10-CM | POA: Diagnosis not present

## 2024-04-08 DIAGNOSIS — H0279 Other degenerative disorders of eyelid and periocular area: Secondary | ICD-10-CM | POA: Diagnosis not present

## 2024-04-08 DIAGNOSIS — H02421 Myogenic ptosis of right eyelid: Secondary | ICD-10-CM | POA: Diagnosis not present

## 2024-04-08 DIAGNOSIS — H02831 Dermatochalasis of right upper eyelid: Secondary | ICD-10-CM | POA: Diagnosis not present

## 2024-04-08 DIAGNOSIS — H02413 Mechanical ptosis of bilateral eyelids: Secondary | ICD-10-CM | POA: Diagnosis not present

## 2024-04-11 ENCOUNTER — Ambulatory Visit: Admitting: Podiatry

## 2024-04-15 ENCOUNTER — Ambulatory Visit (INDEPENDENT_AMBULATORY_CARE_PROVIDER_SITE_OTHER): Admitting: Family Medicine

## 2024-04-15 ENCOUNTER — Other Ambulatory Visit: Payer: Self-pay | Admitting: Family Medicine

## 2024-04-15 ENCOUNTER — Encounter: Payer: Self-pay | Admitting: Family Medicine

## 2024-04-15 VITALS — BP 122/62 | HR 66 | Temp 98.0°F | Ht 64.0 in | Wt 135.1 lb

## 2024-04-15 DIAGNOSIS — Z7984 Long term (current) use of oral hypoglycemic drugs: Secondary | ICD-10-CM | POA: Diagnosis not present

## 2024-04-15 DIAGNOSIS — E119 Type 2 diabetes mellitus without complications: Secondary | ICD-10-CM

## 2024-04-15 LAB — HEMOGLOBIN A1C: Hgb A1c MFr Bld: 6.9 % — ABNORMAL HIGH (ref 4.6–6.5)

## 2024-04-15 LAB — BASIC METABOLIC PANEL WITH GFR
BUN: 22 mg/dL (ref 6–23)
CO2: 28 meq/L (ref 19–32)
Calcium: 9.5 mg/dL (ref 8.4–10.5)
Chloride: 100 meq/L (ref 96–112)
Creatinine, Ser: 0.64 mg/dL (ref 0.40–1.20)
GFR: 87.76 mL/min (ref >60.00)
Glucose, Bld: 117 mg/dL — ABNORMAL HIGH (ref 70–99)
Potassium: 4.2 meq/L (ref 3.5–5.1)
Sodium: 136 meq/L (ref 135–145)

## 2024-04-15 NOTE — Assessment & Plan Note (Signed)
 Chronic problem.  On Jardiance , Onglyza, and Metformin  w/o difficulty.  UTD on eye exam, foot exam, microalbumin.  Check labs.  Adjust meds prn

## 2024-04-15 NOTE — Progress Notes (Signed)
   Subjective:    Patient ID: Ashley Shaffer, female    DOB: [birth year], 73 y.o.   MRN: [id number]  HPI DM- chronic problem, on Jardiance  10mg  daily, Onglyza 5mg  daily, Metformin  XR 1000mg  BID.  UTD on microalbumin, eye exam, foot exam.  Last A1C 6.8%  Denies CP, SOB, HA's, visual changes, abd pain, N/V.  Denies numbness/tingling of hands/feet.  No symptomatic lows.     Review of Systems For ROS see HPI     Objective:   Physical Exam Vitals reviewed.  Constitutional:      General: She is not in acute distress.    Appearance: Normal appearance. She is well-developed. She is not ill-appearing.  HENT:     Head: Normocephalic and atraumatic.   Eyes:     Conjunctiva/sclera: Conjunctivae normal.     Pupils: Pupils are equal, round, and reactive to light.   Neck:     Thyroid : No thyromegaly.   Cardiovascular:     Rate and Rhythm: Normal rate and regular rhythm.     Pulses: Normal pulses.     Heart sounds: Normal heart sounds. No murmur heard. Pulmonary:     Effort: Pulmonary effort is normal. No respiratory distress.     Breath sounds: Normal breath sounds.  Abdominal:     General: There is no distension.     Palpations: Abdomen is soft.     Tenderness: There is no abdominal tenderness.   Musculoskeletal:     Cervical back: Normal range of motion and neck supple.     Right lower leg: No edema.     Left lower leg: No edema.  Lymphadenopathy:     Cervical: No cervical adenopathy.   Skin:    General: Skin is warm and dry.   Neurological:     General: No focal deficit present.     Mental Status: She is alert and oriented to person, place, and time.   Psychiatric:        Behavior: Behavior normal.           Assessment & Plan:

## 2024-04-15 NOTE — Patient Instructions (Signed)
Schedule your complete physical in 6 months We'll notify you of your lab results and make any changes if needed Keep up the good work on healthy diet and regular exercise- you look great!!! Call with any questions or concerns Stay Safe!  Stay Healthy! Have a great summer!!! 

## 2024-04-16 ENCOUNTER — Other Ambulatory Visit: Payer: Self-pay | Admitting: Family Medicine

## 2024-04-16 ENCOUNTER — Ambulatory Visit: Payer: Self-pay | Admitting: Family Medicine

## 2024-04-19 ENCOUNTER — Ambulatory Visit: Admitting: Podiatry

## 2024-04-24 DIAGNOSIS — M4120 Other idiopathic scoliosis, site unspecified: Secondary | ICD-10-CM | POA: Diagnosis not present

## 2024-04-24 DIAGNOSIS — M48061 Spinal stenosis, lumbar region without neurogenic claudication: Secondary | ICD-10-CM | POA: Diagnosis not present

## 2024-04-30 ENCOUNTER — Telehealth: Payer: Self-pay

## 2024-04-30 NOTE — Telephone Encounter (Signed)
 Please schedule this at time of call back

## 2024-05-02 DIAGNOSIS — M545 Low back pain, unspecified: Secondary | ICD-10-CM | POA: Diagnosis not present

## 2024-05-08 DIAGNOSIS — L821 Other seborrheic keratosis: Secondary | ICD-10-CM | POA: Diagnosis not present

## 2024-05-09 DIAGNOSIS — M48061 Spinal stenosis, lumbar region without neurogenic claudication: Secondary | ICD-10-CM | POA: Diagnosis not present

## 2024-05-09 DIAGNOSIS — M4120 Other idiopathic scoliosis, site unspecified: Secondary | ICD-10-CM | POA: Diagnosis not present

## 2024-05-16 ENCOUNTER — Other Ambulatory Visit: Payer: Self-pay | Admitting: Family Medicine

## 2024-05-16 DIAGNOSIS — F339 Major depressive disorder, recurrent, unspecified: Secondary | ICD-10-CM

## 2024-05-20 DIAGNOSIS — H53483 Generalized contraction of visual field, bilateral: Secondary | ICD-10-CM | POA: Diagnosis not present

## 2024-06-03 ENCOUNTER — Other Ambulatory Visit: Payer: Self-pay | Admitting: Family Medicine

## 2024-06-03 MED ORDER — EMPAGLIFLOZIN 10 MG PO TABS: ORAL_TABLET | ORAL | 1 refills | Status: AC

## 2024-06-03 MED ORDER — SAXAGLIPTIN HCL 2.5 MG PO TABS: 5.0000 mg | ORAL_TABLET | Freq: Every day | ORAL | 3 refills | Status: AC

## 2024-06-03 NOTE — Telephone Encounter (Signed)
 Copied from CRM [id number]. Topic: Clinical - Medication Refill >> Jun 03, 2024 11:31 AM Macario HERO wrote: Medication: saxagliptin  HCl (ONGLYZA) 2.5 MG TABS tablet [[id number]] empagliflozin  (JARDIANCE ) 10 MG TABS tablet [[id number]]  Has the patient contacted their pharmacy? Yes (Agent: If no, request that the patient contact the pharmacy for the refill. If patient does not wish to contact the pharmacy document the reason why and proceed with request.) (Agent: If yes, when and what did the pharmacy advise?) waiting for prescription.  This is the patient's preferred pharmacy:  [id number] - [place], [place] - [address] [place] [address] Phone: [phone] Fax: [phone]  Is this the correct pharmacy for this prescription? Yes If no, delete pharmacy and type the correct one.   Has the prescription been filled recently? Yes  Is the patient out of the medication? Yes  Has the patient been seen for an appointment in the last year OR does the patient have an upcoming appointment? Yes  Can we respond through MyChart? Yes  Agent: Please be advised that Rx refills may take up to 3 business days. We ask that you follow-up with your pharmacy.

## 2024-06-04 ENCOUNTER — Other Ambulatory Visit: Payer: Self-pay

## 2024-06-04 DIAGNOSIS — E785 Hyperlipidemia, unspecified: Secondary | ICD-10-CM

## 2024-07-06 ENCOUNTER — Other Ambulatory Visit: Payer: Self-pay | Admitting: Family Medicine

## 2024-07-06 DIAGNOSIS — E785 Hyperlipidemia, unspecified: Secondary | ICD-10-CM

## 2024-07-08 ENCOUNTER — Encounter: Payer: Self-pay | Admitting: Family Medicine

## 2024-07-21 ENCOUNTER — Other Ambulatory Visit: Payer: Self-pay | Admitting: Family Medicine

## 2024-07-21 DIAGNOSIS — E038 Other specified hypothyroidism: Secondary | ICD-10-CM

## 2024-07-28 ENCOUNTER — Other Ambulatory Visit: Payer: Self-pay | Admitting: Family Medicine

## 2024-07-30 ENCOUNTER — Other Ambulatory Visit: Payer: Self-pay | Admitting: Family Medicine

## 2024-07-30 DIAGNOSIS — Z1231 Encounter for screening mammogram for malignant neoplasm of breast: Secondary | ICD-10-CM

## 2024-07-31 DIAGNOSIS — L308 Other specified dermatitis: Secondary | ICD-10-CM | POA: Diagnosis not present

## 2024-08-02 NOTE — Progress Notes (Signed)
 Ashley Shaffer                                          MRN: [id number]   08/02/2024   The [facility] Quality Team Specialist reviewed this patient medical record for the purposes of chart review for care gap closure. The following were reviewed: chart review for care gap closure-kidney health evaluation for diabetes:eGFR  and uACR.    [facility]BCI Quality Team

## 2024-08-05 ENCOUNTER — Other Ambulatory Visit: Payer: Self-pay | Admitting: Family Medicine

## 2024-08-05 DIAGNOSIS — E785 Hyperlipidemia, unspecified: Secondary | ICD-10-CM

## 2024-08-05 NOTE — Telephone Encounter (Signed)
 Requesting 90 day supply.

## 2024-08-05 NOTE — Telephone Encounter (Unsigned)
 Copied from CRM #[id number]. Topic: Clinical - Medication Refill >> Aug 05, 2024  4:27 PM Dedra B wrote: Medication: rosuvastatin  (CRESTOR ) 5 MG tablet, requesting 3 mo supply  Has the patient contacted their pharmacy? Chaney Moccasin, pharmacy tech from [place] is the caller. [phone]  This is the patient's preferred pharmacy:  [id number] - [place], [place] - [address] [place] [address] Phone: [phone] Fax: [phone]  Is this the correct pharmacy for this prescription? Yes  Has the prescription been filled recently? No  Is the patient out of the medication? No  Has the patient been seen for an appointment in the last year OR does the patient have an upcoming appointment? Yes  Can we respond through MyChart? Yes  Agent: Please be advised that Rx refills may take up to 3 business days. We ask that you follow-up with your pharmacy.

## 2024-08-06 MED ORDER — ROSUVASTATIN CALCIUM 5 MG PO TABS: 5.0000 mg | ORAL_TABLET | Freq: Every day | ORAL | 1 refills | Status: DC

## 2024-08-20 NOTE — Progress Notes (Signed)
 Office Visit Note  Patient: Ashley Shaffer             Date of Birth: 08-Jul-1951           MRN: [id number]             PCP: Mahlon Comer BRAVO, MD Referring: Mahlon Comer BRAVO, MD Visit Date: 09/03/2024   Subjective:  Medical Management of Chronic Issues (Patient has fallen twice this year. )   Discussed the use of AI scribe software for clinical note transcription with the patient, who gave verbal consent to proceed.  History of Present Illness   Ashley Shaffer is a 73 y.o. female here for follow up for osteoarthritis joint pain in multiple areas.   She experiences persistent osteoarthritis symptoms, particularly in her hands and thumbs, with constant symptoms and no episodic flares.  She previously used Celebrex  100 mg without relief, describing it as ineffective. Currently, ibuprofen with famotidine provides the best symptom relief. She also takes methotrexate 10 mg for eczema, which is effective. Recent blood work from October including CBC and CMP reviewed was satisfactory.  She takes gabapentin at bedtime for back pain, which is effective for her back but not for other joints. No significant swelling in her joints, which she describes as unchanged. She has tried diclofenac  in the past but is uncertain about its effectiveness and is concerned about potential gastrointestinal side effects.       Previous HPI 09/04/2023 Ashley Shaffer is a 73 y.o. female here for follow up for osteoarthritis joint pain in multiple areas.  She has been on Celebrex  100 mg twice daily with pitting improvement in most of her joint pain and stiffness.  Worst problem area had been in the lower legs increasing pain both in the feet and in calf muscles.  She ran out of Celebrex  and had to take over-the-counter medicine combined famotidine with ibuprofen 600 mg comparable to her previous Duexis and felt this was very beneficial. Methotrexate dose was decreased from 4 to 3 tablets weekly  by her dermatologist with good control of atopic dermatitis.  Also on the Elidel and Diprolene  topical treatments.  She saw Dr. Romona for removal of right fifth DIP mucinous cyst that went well.  Saw Dr. Gershon about the left toe deformities recommended this would eventually require surgery which she is holding off on due to spending so much time in surgery and PT for both knee replacements last year.   Previous HPI 03/03/2023 Ashley Shaffer is a 73 y.o. female here for follow up for joint pain in multiple areas initial visit workup showing  negative for more specific antibody markers and no increase in systemic inflammatory markers. Xrays with degenerative arthritis of multiple areas and possible EOH distal joint changes. Symptoms ongoing about the same as seen in March still without much visible joint swelling.   Previous HPI 01/17/23 Ashley Shaffer is a 73 y.o. female here for evaluation of positive ANA checked in association with joint pains and skin rashes.  Long history of arthritis in multiple areas dating back at least about 10 years of noticeable symptoms.  Joint pain and stiffness in both hands as previously been treated with Celebrex  with pretty good benefit did not improve a lot with ibuprofen.  She has not had any particular imaging of her hands.  Previously had arthroscopic repair of rotator cuff tendon and bilateral shoulders subsequently reinjured both of these.  She also had knee replacements on both sides  over the course of last year and finished physical therapy within the past month.  She had 2 surgeries for right great toe bunion and revision without entirely satisfactory result so has left left foot unaddressed.  Symptoms are persistent but without very prolonged stiffness and feels her mobility and function are manageable on the Celebrex .  She stays active horse riding.  Most recently having some worsening of the bony nodules and deviation in her finger joints.   Especially with new cyst on her right small finger that has occasionally been draining clear fluid. New issue last year was development of atopic dermatitis.  This started on her torso subsequently progressing to involve her groin area.  This was treated with hydrocortisone  treatment with minimal benefit.  She established with dermatology at Dr. Milford office started on methotrexate 1 month ago at 10 mg weekly dose.  She has upcoming follow-up planned for rechecking labs and possible medication adjustment.  So far the rash on her torso has partially responded groin rash is not changed much. She has not noticed any new complaint of hair loss, photosensitivity, oral nasal ulcers, lymphadenopathy, or Raynaud's symptoms.   Labs reviewed 09/2022 ANA 1:40 speckled 1:40 cytoplasmic RF neg   Review of Systems  Constitutional:  Negative for fatigue.  HENT:  Negative for mouth sores and mouth dryness.   Eyes:  Positive for dryness.  Respiratory:  Negative for shortness of breath.   Cardiovascular:  Negative for chest pain and palpitations.  Gastrointestinal:  Negative for blood in stool, constipation and diarrhea.  Endocrine: Negative for increased urination.  Genitourinary:  Negative for involuntary urination.  Musculoskeletal:  Positive for joint pain, gait problem, joint pain and morning stiffness. Negative for joint swelling, myalgias, muscle weakness, muscle tenderness and myalgias.  Skin:  Positive for rash. Negative for color change, hair loss and sensitivity to sunlight.  Allergic/Immunologic: Negative for susceptible to infections.  Neurological:  Negative for dizziness and headaches.  Hematological:  Negative for swollen glands.  Psychiatric/Behavioral:  Negative for depressed mood and sleep disturbance. The patient is not nervous/anxious.     PMFS History:  Patient Active Problem List   Diagnosis Date Noted   Arthrosis of first carpometacarpal joint 09/03/2024   Generalized  osteoarthritis 01/17/2023   Positive ANA (antinuclear antibody) 01/17/2023   Vertigo 11/30/2022   Hyperlipidemia 04/03/2017   Retinal scar of left eye 06/29/2016   Physical exam 04/01/2016   Diabetes mellitus type II, controlled, with no complications (HCC) 01/27/2016   Hypothyroid 01/27/2016   Depression 01/27/2016   Idiopathic scoliosis 01/27/2016    Past Medical History:  Diagnosis Date   Arthritis    Basal cell carcinoma    Depression    Diabetes mellitus without complication (HCC)    type II   Eczema    Hyperlipidemia    Ischemic colitis    Left ACL tear    Osteopenia    Thyroid  disease    thyroid  removed age 31 due to benign mass   Vertigo     Family History  Problem Relation Age of Onset   Heart disease Mother    Healthy Sister    Diabetes Maternal Aunt    Heart disease Brother    Breast cancer Neg Hx    Past Surgical History:  Procedure Laterality Date   BREAST BIOPSY Left    Benign   BREAST MASS EXCISION Right 1998   BUNIONECTOMY  1997   KNEE ARTHROSCOPY  12/04/2007   REPLACEMENT TOTAL KNEE Left 02/21/2022  REPLACEMENT TOTAL KNEE Right 07/2022   SHOULDER SURGERY Right 08/10/2006   THYROIDECTOMY  1978   due to enlarged goiter   TONSILLECTOMY     TOTAL KNEE ARTHROPLASTY Left 02/21/2022   Procedure: LEFT TOTAL KNEE ARTHROPLASTY;  Surgeon: Edna Toribio LABOR, MD;  Location: [facility];  Service: Orthopedics;  Laterality: Left;   TUBAL LIGATION     Social History   Social History Narrative   Not on file   Immunization History  Administered Date(s) Administered   Fluad Quad(high Dose 65+) 07/16/2019, 07/13/2020, 07/15/2021   Fluad Trivalent(High Dose 65+) 07/05/2023   INFLUENZA, HIGH DOSE SEASONAL PF 07/12/2018   Influenza,inj,Quad PF,6+ Mos 08/01/2008, 08/10/2010, 08/31/2012, 08/05/2013, 10/07/2014, 09/16/2015, 10/11/2016, 07/18/2017   Influenza-Unspecified 08/14/2021, 07/29/2022, 07/29/2022   PFIZER Comirnaty(Gray Top)Covid-19 Tri-Sucrose Vaccine  02/23/2021, 02/23/2021   PFIZER(Purple Top)SARS-COV-2 Vaccination 11/21/2019, 12/12/2019, 07/29/2022   PNEUMOCOCCAL CONJUGATE-20 08/05/2022   Pfizer Covid-19 Vaccine Bivalent Booster 67yrs & up 08/03/2021, 07/29/2022   Pneumococcal Conjugate-13 04/01/2016   Pneumococcal Polysaccharide-23 08/31/2012, 04/03/2017   Respiratory Syncytial Virus Vaccine,Recomb Aduvanted(Arexvy) 08/04/2022   Tdap 10/15/2007   Zoster Recombinant(Shingrix) 08/27/2021, 01/10/2022   Zoster, Live 10/13/2011     Objective: Vital Signs: BP 126/63   Pulse 70   Temp (!) 97.2 F (36.2 C)   Resp 16   Ht 5' 4 (1.626 m)   Wt 136 lb 3.2 oz (61.8 kg)   LMP 10/31/2004 (Approximate)   BMI 23.38 kg/m    Physical Exam Eyes:     Conjunctiva/sclera: Conjunctivae normal.  Cardiovascular:     Rate and Rhythm: Normal rate and regular rhythm.  Pulmonary:     Effort: Pulmonary effort is normal.     Breath sounds: Normal breath sounds.  Lymphadenopathy:     Cervical: No cervical adenopathy.  Skin:    General: Skin is warm and dry.  Neurological:     Mental Status: She is alert.  Psychiatric:        Mood and Affect: Mood normal.      Musculoskeletal Exam:  Shoulders full ROM no tenderness or swelling Elbows full ROM no tenderness or swelling Wrists full ROM no tenderness or swelling Heberdon's nodes both hands and MCPs and worst in DIP joints, right thumb 1st CMC joint tenderness Knees full ROM no tenderness or swelling Ankles full ROM no tenderness or swelling Left first MTP bunion with moderate lateral deviation, cocked up toe deformities and other toes, ingrown toenail at right second toe   Investigation: No additional findings.  Imaging: MM 3D SCREENING MAMMOGRAM BILATERAL BREAST Result Date: 09/03/2024 CLINICAL DATA:  Screening. EXAM: DIGITAL SCREENING BILATERAL MAMMOGRAM WITH TOMOSYNTHESIS AND CAD TECHNIQUE: Bilateral screening digital craniocaudal and mediolateral oblique mammograms were obtained.  Bilateral screening digital breast tomosynthesis was performed. The images were evaluated with computer-aided detection. COMPARISON:  Previous exam(s). ACR Breast Density Category c: The breasts are heterogeneously dense, which may obscure small masses. FINDINGS: There are no findings suspicious for malignancy. IMPRESSION: No mammographic evidence of malignancy. A result letter of this screening mammogram will be mailed directly to the patient. RECOMMENDATION: Screening mammogram in one year. (Code:SM-B-01Y) BI-RADS CATEGORY  1: Negative. Electronically Signed   By: Alm Parkins M.D.   On: 09/03/2024 10:33    Recent Labs: Lab Results  Component Value Date   WBC 6.2 10/12/2023   HGB 14.0 10/12/2023   PLT 340.0 10/12/2023   NA 136 04/15/2024   K 4.2 04/15/2024   CL 100 04/15/2024   CO2 28 04/15/2024   GLUCOSE  117 (H) 04/15/2024   BUN 22 04/15/2024   CREATININE 0.64 04/15/2024   BILITOT 0.5 10/12/2023   ALKPHOS 65 10/12/2023   AST 18 10/12/2023   ALT 16 10/12/2023   PROT 6.7 10/12/2023   ALBUMIN 4.4 10/12/2023   CALCIUM  9.5 04/15/2024   GFRAA >60 11/14/2019    Speciality Comments: No specialty comments available.  Procedures:  No procedures performed Allergies: Patient has no known allergies.   Assessment / Plan:     Visit Diagnoses: Generalized osteoarthritis - Plan: diclofenac  (VOLTAREN ) 75 MG EC tablet Chronic polyosteoarthritis with significant right thumb joint involvement causing subluxation and hyperextension. Current management with ibuprofen and famotidine is most effective but still only partially and interested in other options. - Prescribe diclofenac  75 mg twice daily with famotidine 40 mg twice daily. - Discussed surgery viable given her age and health, with recovery involving hand use restriction. Explained surgery's impact on thumb range of motion but not worsening current condition. - Recommended referral to Dr. Camella at [facility] for hand surgery consultation,  gave information to review  Chronic back pain Chronic back pain managed with gabapentin at bedtime, effectively alleviating morning pain. Gabapentin not effective for other joint pain. - Continue gabapentin at bedtime. - Consider Lyrica if diclofenac  is ineffective or intolerant. Discussed Lyrica for consistent pain control, but she prefers diclofenac  first due to concerns about Lyrica's side effects and withdrawal.        Orders: No orders of the defined types were placed in this encounter.  Meds ordered this encounter  Medications   diclofenac  (VOLTAREN ) 75 MG EC tablet    Sig: Take 1 tablet (75 mg total) by mouth 2 (two) times daily.    Dispense:  60 tablet    Refill:  5     Follow-Up Instructions: Return in about 1 year (around 09/03/2025) for OA diclofenac  switch f/u 1 yr.   Lonni LELON Ester, MD  Note - This record has been created using [employer].  Chart creation errors have been sought, but may not always  have been located. Such creation errors do not reflect on  the standard of medical care.

## 2024-08-27 DIAGNOSIS — L308 Other specified dermatitis: Secondary | ICD-10-CM | POA: Diagnosis not present

## 2024-08-27 DIAGNOSIS — Z79899 Other long term (current) drug therapy: Secondary | ICD-10-CM | POA: Diagnosis not present

## 2024-08-28 ENCOUNTER — Other Ambulatory Visit: Payer: Self-pay | Admitting: Family Medicine

## 2024-08-28 DIAGNOSIS — F339 Major depressive disorder, recurrent, unspecified: Secondary | ICD-10-CM

## 2024-08-30 ENCOUNTER — Ambulatory Visit
Admission: RE | Admit: 2024-08-30 | Discharge: 2024-08-30 | Disposition: A | Discharge status: Home / Self Care | Source: Ambulatory Visit | Attending: Family Medicine | Admitting: Family Medicine

## 2024-08-30 DIAGNOSIS — Z1231 Encounter for screening mammogram for malignant neoplasm of breast: Secondary | ICD-10-CM

## 2024-09-03 ENCOUNTER — Ambulatory Visit: Payer: Medicare HMO | Attending: Internal Medicine | Admitting: Internal Medicine

## 2024-09-03 ENCOUNTER — Encounter: Payer: Self-pay | Admitting: Internal Medicine

## 2024-09-03 VITALS — BP 126/63 | HR 70 | Temp 97.2°F | Resp 16 | Ht 64.0 in | Wt 136.2 lb

## 2024-09-03 DIAGNOSIS — R7689 Other specified abnormal immunological findings in serum: Secondary | ICD-10-CM | POA: Diagnosis not present

## 2024-09-03 DIAGNOSIS — M159 Polyosteoarthritis, unspecified: Secondary | ICD-10-CM | POA: Diagnosis not present

## 2024-09-03 DIAGNOSIS — Z79899 Other long term (current) drug therapy: Secondary | ICD-10-CM | POA: Diagnosis not present

## 2024-09-03 DIAGNOSIS — M189 Osteoarthritis of first carpometacarpal joint, unspecified: Secondary | ICD-10-CM | POA: Insufficient documentation

## 2024-09-03 MED ORDER — DICLOFENAC SODIUM 75 MG PO TBEC: 75.0000 mg | DELAYED_RELEASE_TABLET | Freq: Two times a day (BID) | ORAL | 5 refills | Status: AC

## 2024-09-03 NOTE — Patient Instructions (Signed)
 I recommend to discuss with hand surgery at [facility] about possible treatment for your advanced thumb osteoarthritis. You could see Dr. Romona otherwise Dr. Camella is another surgeon who I share a lot of patients for this particular problem/surgery.  I sent a new prescription for the diclofenac  to try in place of ibuprofen. This is safe to take along with famotidine like you have been doing.

## 2024-09-25 ENCOUNTER — Other Ambulatory Visit: Payer: Self-pay | Admitting: Family Medicine

## 2024-09-25 DIAGNOSIS — E785 Hyperlipidemia, unspecified: Secondary | ICD-10-CM

## 2024-11-04 ENCOUNTER — Encounter: Payer: Self-pay | Admitting: Family Medicine

## 2024-11-04 ENCOUNTER — Ambulatory Visit: Admitting: Family Medicine

## 2024-11-04 VITALS — BP 122/60 | HR 65 | Temp 98.8°F | Wt 135.8 lb

## 2024-11-04 DIAGNOSIS — Z0000 Encounter for general adult medical examination without abnormal findings: Secondary | ICD-10-CM | POA: Diagnosis not present

## 2024-11-04 DIAGNOSIS — E119 Type 2 diabetes mellitus without complications: Secondary | ICD-10-CM | POA: Diagnosis not present

## 2024-11-04 LAB — CBC WITH DIFFERENTIAL/PLATELET
Basophils Absolute: 0 K/uL (ref 0.0–0.1)
Basophils Relative: 0.6 % (ref 0.0–3.0)
Eosinophils Absolute: 0.3 K/uL (ref 0.0–0.7)
Eosinophils Relative: 4.8 % (ref 0.0–5.0)
HCT: 41.5 % (ref 36.0–46.0)
Hemoglobin: 13.7 g/dL (ref 12.0–15.0)
Lymphocytes Relative: 23 % (ref 12.0–46.0)
Lymphs Abs: 1.6 K/uL (ref 0.7–4.0)
MCHC: 33.1 g/dL (ref 30.0–36.0)
MCV: 91.8 fl (ref 78.0–100.0)
Monocytes Absolute: 0.6 K/uL (ref 0.1–1.0)
Monocytes Relative: 8.5 % (ref 3.0–12.0)
Neutro Abs: 4.5 K/uL (ref 1.4–7.7)
Neutrophils Relative %: 63.1 % (ref 43.0–77.0)
Platelets: 319 K/uL (ref 150.0–400.0)
RBC: 4.52 Mil/uL (ref 3.87–5.11)
RDW: 14.4 % (ref 11.5–15.5)
WBC: 7.1 K/uL (ref 4.0–10.5)

## 2024-11-04 LAB — LIPID PANEL
Cholesterol: 144 mg/dL (ref 28–200)
HDL: 69.6 mg/dL
LDL Cholesterol: 54 mg/dL (ref 10–99)
NonHDL: 74.08
Total CHOL/HDL Ratio: 2
Triglycerides: 98 mg/dL (ref 10.0–149.0)
VLDL: 19.6 mg/dL (ref 0.0–40.0)

## 2024-11-04 LAB — HEPATIC FUNCTION PANEL
ALT: 15 U/L (ref 3–35)
AST: 18 U/L (ref 5–37)
Albumin: 4.5 g/dL (ref 3.5–5.2)
Alkaline Phosphatase: 69 U/L (ref 39–117)
Bilirubin, Direct: 0.1 mg/dL (ref 0.1–0.3)
Total Bilirubin: 0.5 mg/dL (ref 0.2–1.2)
Total Protein: 6.8 g/dL (ref 6.0–8.3)

## 2024-11-04 LAB — BASIC METABOLIC PANEL WITH GFR
BUN: 23 mg/dL (ref 6–23)
CO2: 27 meq/L (ref 19–32)
Calcium: 9.2 mg/dL (ref 8.4–10.5)
Chloride: 100 meq/L (ref 96–112)
Creatinine, Ser: 0.63 mg/dL (ref 0.40–1.20)
GFR: 87.75 mL/min
Glucose, Bld: 132 mg/dL — ABNORMAL HIGH (ref 70–99)
Potassium: 4.2 meq/L (ref 3.5–5.1)
Sodium: 134 meq/L — ABNORMAL LOW (ref 135–145)

## 2024-11-04 LAB — MICROALBUMIN / CREATININE URINE RATIO
Creatinine,U: 19.3 mg/dL
Microalb Creat Ratio: UNDETERMINED mg/g (ref 0.0–30.0)
Microalb, Ur: <0.7 mg/dL

## 2024-11-04 LAB — HEMOGLOBIN A1C: Hgb A1c MFr Bld: 6.9 % — ABNORMAL HIGH (ref 4.6–6.5)

## 2024-11-04 LAB — TSH: TSH: 3.47 u[IU]/mL (ref 0.35–5.50)

## 2024-11-04 NOTE — Assessment & Plan Note (Signed)
 Chronic problem.  UTD on eye exam.  Foot exam done today.  Microalbumin and A1C ordered.  Adjust meds prn.

## 2024-11-04 NOTE — Assessment & Plan Note (Signed)
 Pt's PE WNL and unchanged from previous.  UTD on cologuard, mammo, PNA.  Foot exam done today.  Eye exam report requested.  Check labs.  Anticipatory guidance provided.

## 2024-11-04 NOTE — Patient Instructions (Signed)
 Follow up in 6 months to recheck sugar We'll notify you of your lab results and make any changes if needed Keep up the good work on healthy diet and regular exercise- you look great! Call with any questions or concerns Stay Safe!  Stay Healthy! Happy New Year!

## 2024-11-04 NOTE — Progress Notes (Signed)
" ° °  Subjective:    Patient ID: Ashley Shaffer Patient, female    DOB: 1951-07-10, 74 y.o.   MRN: [id number]  HPI CPE- due for foot exam, A1C, microalbumin.  Had eye exam on 12/8- [place].  UTD on cologuard, mammo, PNA.  Health Maintenance  Topic Date Due   Medicare Annual Wellness (AWV)  Never done   Diabetic kidney evaluation - Urine ACR  Never done   Influenza Vaccine  05/31/2024   OPHTHALMOLOGY EXAM  10/04/2024   FOOT EXAM  10/11/2024   HEMOGLOBIN A1C  10/15/2024   Diabetic kidney evaluation - eGFR measurement  04/15/2025   Fecal DNA (Cologuard)  03/27/2026   Mammogram  08/30/2026   Pneumococcal Vaccine: 50+ Years  Completed   Bone Density Scan  Completed   Hepatitis C Screening  Completed   Zoster Vaccines- Shingrix  Completed   Meningococcal B Vaccine  Aged Out   DTaP/Tdap/Td  Discontinued   Colonoscopy  Discontinued   COVID-19 Vaccine  Discontinued     Patient Care Team    Relationship Specialty Notifications Start End  Mahlon Comer BRAVO, MD PCP - General Family Medicine  01/22/16   Cathlyn JAYSON Nikki Bobie BRAVO, MD Consulting Physician Obstetrics and Gynecology  04/24/20   Elma Shona RAMAN, MD Attending Physician Optometry  10/12/23   [facility]    04/15/24      Review of Systems Patient reports no vision/ hearing changes, adenopathy,fever, weight change,  persistant/recurrent hoarseness , swallowing issues, chest pain, palpitations, edema, persistant/recurrent cough, hemoptysis, dyspnea (rest/exertional/paroxysmal nocturnal), gastrointestinal bleeding (melena, rectal bleeding), abdominal pain, significant heartburn, bowel changes, GU symptoms (dysuria, hematuria, incontinence), Gyn symptoms (abnormal  bleeding, pain),  syncope, focal weakness, memory loss, numbness & tingling, skin/hair/nail changes, abnormal bruising or bleeding, anxiety, or depression.     Objective:   Physical Exam General Appearance:    Alert, cooperative, no distress, appears stated age   Head:    Normocephalic, without obvious abnormality, atraumatic  Eyes:    PERRL, conjunctiva/corneas clear, EOM's intact, fundi    benign, both eyes  Ears:    Normal TM's and external ear canals, both ears  Nose:   Nares normal, septum midline, mucosa normal, no drainage    or sinus tenderness  Throat:   Lips, mucosa, and tongue normal; teeth and gums normal  Neck:   Supple, symmetrical, trachea midline, no adenopathy;    Thyroid : no enlargement/tenderness/nodules  Back:     Symmetric, no curvature, ROM normal, no CVA tenderness  Lungs:     Clear to auscultation bilaterally, respirations unlabored  Chest Wall:    No tenderness or deformity   Heart:    Regular rate and rhythm, S1 and S2 normal, no murmur, rub   or gallop  Breast Exam:    Deferred to mammo  Abdomen:     Soft, non-tender, bowel sounds active all four quadrants,    no masses, no organomegaly  Genitalia:    Deferred  Rectal:    Extremities:   Extremities normal, atraumatic, no cyanosis or edema  Pulses:   2+ and symmetric all extremities  Skin:   Skin color, texture, turgor normal, no rashes or lesions  Lymph nodes:   Cervical, supraclavicular, and axillary nodes normal  Neurologic:   CNII-XII intact, normal strength, sensation and reflexes    throughout          Assessment & Plan:    "

## 2024-11-07 ENCOUNTER — Ambulatory Visit: Payer: Self-pay | Admitting: Family Medicine

## 2024-11-07 ENCOUNTER — Other Ambulatory Visit: Payer: Self-pay | Admitting: Family Medicine

## 2024-11-07 DIAGNOSIS — E038 Other specified hypothyroidism: Secondary | ICD-10-CM

## 2024-11-07 MED ORDER — LEVOTHYROXINE SODIUM 150 MCG PO TABS: ORAL_TABLET | ORAL | 1 refills | Status: DC

## 2024-11-07 NOTE — Telephone Encounter (Signed)
 Copied from CRM #[id number]. Topic: Clinical - Medication Refill >> Nov 07, 2024 10:56 AM Victoria A wrote: Medication: levothyroxine  (SYNTHROID ) 150 MCG tablet  Has the patient contacted their pharmacy? Yes (Agent: If no, request that the patient contact the pharmacy for the refill. If patient does not wish to contact the pharmacy document the reason why and proceed with request.) (Agent: If yes, when and what did the pharmacy advise?) Has requested medication refill no response  This is the patient's preferred pharmacy:  [id number] - [place], [place] - [address] [place] [address] Phone: [phone] Fax: [phone]  Is this the correct pharmacy for this prescription? Yes If no, delete pharmacy and type the correct one.   Has the prescription been filled recently? No  Is the patient out of the medication? Yes  Has the patient been seen for an appointment in the last year OR does the patient have an upcoming appointment? Yes  Can we respond through MyChart? Yes  Agent: Please be advised that Rx refills may take up to 3 business days. We ask that you follow-up with your pharmacy.

## 2024-11-10 ENCOUNTER — Other Ambulatory Visit: Payer: Self-pay | Admitting: Family Medicine

## 2024-11-10 DIAGNOSIS — E038 Other specified hypothyroidism: Secondary | ICD-10-CM

## 2025-05-05 ENCOUNTER — Ambulatory Visit: Admitting: Family Medicine

## 2025-09-03 ENCOUNTER — Ambulatory Visit: Admitting: Internal Medicine
